# Patient Record
Sex: Female | Born: 1985
Health system: Southern US, Community
[De-identification: ages and names within clinical notes are randomized; demographics above are authoritative.]

## PROBLEM LIST (undated history)

## (undated) ENCOUNTER — Emergency Department (HOSPITAL_BASED_OUTPATIENT_CLINIC_OR_DEPARTMENT_OTHER): Payer: Medicaid Other

## (undated) DIAGNOSIS — L03114 Cellulitis of left upper limb: Secondary | ICD-10-CM

## (undated) DIAGNOSIS — F1910 Other psychoactive substance abuse, uncomplicated: Secondary | ICD-10-CM

## (undated) DIAGNOSIS — L409 Psoriasis, unspecified: Secondary | ICD-10-CM

## (undated) DIAGNOSIS — F32A Depression, unspecified: Secondary | ICD-10-CM

## (undated) DIAGNOSIS — F329 Major depressive disorder, single episode, unspecified: Secondary | ICD-10-CM

## (undated) DIAGNOSIS — F1110 Opioid abuse, uncomplicated: Secondary | ICD-10-CM

## (undated) DIAGNOSIS — F1120 Opioid dependence, uncomplicated: Secondary | ICD-10-CM

## (undated) DIAGNOSIS — F1990 Other psychoactive substance use, unspecified, uncomplicated: Secondary | ICD-10-CM

## (undated) DIAGNOSIS — F988 Other specified behavioral and emotional disorders with onset usually occurring in childhood and adolescence: Secondary | ICD-10-CM

## (undated) DIAGNOSIS — L02511 Cutaneous abscess of right hand: Secondary | ICD-10-CM

## (undated) DIAGNOSIS — F419 Anxiety disorder, unspecified: Secondary | ICD-10-CM

## (undated) DIAGNOSIS — J852 Abscess of lung without pneumonia: Secondary | ICD-10-CM

## (undated) HISTORY — DX: Psoriasis, unspecified: L40.9

## (undated) HISTORY — DX: Cutaneous abscess of right hand: L02.511

## (undated) HISTORY — DX: Opioid dependence, uncomplicated: F11.20

## (undated) HISTORY — DX: Other psychoactive substance abuse, uncomplicated: F19.10

## (undated) HISTORY — DX: Other psychoactive substance use, unspecified, uncomplicated: F19.90

## (undated) HISTORY — DX: Abscess of lung without pneumonia: J85.2

## (undated) HISTORY — DX: Major depressive disorder, single episode, unspecified: F32.9

## (undated) HISTORY — DX: Anxiety disorder, unspecified: F41.9

## (undated) HISTORY — DX: Depression, unspecified: F32.A

## (undated) HISTORY — DX: Cellulitis of left upper limb: L03.114

---

## 2017-10-16 ENCOUNTER — Emergency Department (HOSPITAL_COMMUNITY)
Admission: EM | Admit: 2017-10-16 | Discharge: 2017-10-16 | Discharge status: Against Medical Advice | Payer: Self-pay | Attending: Emergency Medicine | Admitting: Emergency Medicine

## 2017-10-16 ENCOUNTER — Encounter (HOSPITAL_COMMUNITY): Payer: Self-pay | Admitting: Neurology

## 2017-10-16 DIAGNOSIS — F1920 Other psychoactive substance dependence, uncomplicated: Secondary | ICD-10-CM | POA: Insufficient documentation

## 2017-10-16 DIAGNOSIS — L02414 Cutaneous abscess of left upper limb: Secondary | ICD-10-CM | POA: Insufficient documentation

## 2017-10-16 DIAGNOSIS — L0291 Cutaneous abscess, unspecified: Secondary | ICD-10-CM

## 2017-10-16 DIAGNOSIS — Z5320 Procedure and treatment not carried out because of patient's decision for unspecified reasons: Secondary | ICD-10-CM | POA: Insufficient documentation

## 2017-10-16 LAB — CBC WITH DIFFERENTIAL/PLATELET
ABS IMMATURE GRANULOCYTES: 0.03 10*3/uL (ref 0.00–0.07)
BASOS ABS: 0.1 10*3/uL (ref 0.0–0.1)
Basophils Relative: 1 %
EOS PCT: 2 %
Eosinophils Absolute: 0.2 10*3/uL (ref 0.0–0.5)
HCT: 43.1 % (ref 36.0–46.0)
HEMOGLOBIN: 13.1 g/dL (ref 12.0–15.0)
IMMATURE GRANULOCYTES: 0 %
LYMPHS PCT: 21 %
Lymphs Abs: 2 10*3/uL (ref 0.7–4.0)
MCH: 26.7 pg (ref 26.0–34.0)
MCHC: 30.4 g/dL (ref 30.0–36.0)
MCV: 87.8 fL (ref 80.0–100.0)
Monocytes Absolute: 1 10*3/uL (ref 0.1–1.0)
Monocytes Relative: 11 %
NEUTROS ABS: 6.6 10*3/uL (ref 1.7–7.7)
NEUTROS PCT: 65 %
NRBC: 0 % (ref 0.0–0.2)
Platelets: 416 10*3/uL — ABNORMAL HIGH (ref 150–400)
RBC: 4.91 MIL/uL (ref 3.87–5.11)
RDW: 15.2 % (ref 11.5–15.5)
WBC: 9.9 10*3/uL (ref 4.0–10.5)

## 2017-10-16 LAB — BASIC METABOLIC PANEL
ANION GAP: 10 (ref 5–15)
BUN: 10 mg/dL (ref 6–20)
CHLORIDE: 104 mmol/L (ref 98–111)
CO2: 24 mmol/L (ref 22–32)
Calcium: 9.2 mg/dL (ref 8.9–10.3)
Creatinine, Ser: 0.73 mg/dL (ref 0.44–1.00)
GFR calc non Af Amer: >60 mL/min (ref >60)
Glucose, Bld: 96 mg/dL (ref 70–99)
Potassium: 3.6 mmol/L (ref 3.5–5.1)
SODIUM: 138 mmol/L (ref 135–145)

## 2017-10-16 LAB — I-STAT BETA HCG BLOOD, ED (MC, WL, AP ONLY): I-stat hCG, quantitative: <5 m[IU]/mL (ref <5)

## 2017-10-16 NOTE — ED Notes (Signed)
Called pt for vitals no answer 

## 2017-10-16 NOTE — ED Triage Notes (Signed)
Pt reports abscess to left forearm x 1 week from drug use. Denies any fevers.

## 2017-10-16 NOTE — ED Provider Notes (Signed)
Patient placed in Quick Look pathway, seen and evaluated   Chief Complaint: abscess  HPI:   Pt complaining of abscess to left forearm x1 week. Endorses IVDU, states she does not know the drug.  Denies fever, drainage. Put baby powder on it. Pain 5/10 severity.  ROS: + abscess, - fever  Physical Exam:   Gen: No distress  Neuro: Awake and Alert  Skin: Warm    Focused Exam: large erythematous fluctuant abscess to left proximal forearm on dorsal aspect. Some surrounding induration.  Not actively draining. No streaking.     Initiation of care has begun. The patient has been counseled on the process, plan, and necessity for staying for the completion/evaluation, and the remainder of the medical screening examination    Cheryl Hogan, Cheryl N, PA-C 10/16/17 1317    Cheryl Laine, MD 10/16/17 [phone]

## 2017-10-16 NOTE — ED Notes (Signed)
Unable to update vitals pt did not answer

## 2017-10-16 NOTE — ED Notes (Signed)
Unable to update vitals pt not answering 

## 2017-10-16 NOTE — ED Notes (Signed)
No answer x3

## 2018-01-23 ENCOUNTER — Inpatient Hospital Stay (HOSPITAL_COMMUNITY)
Admission: EM | Admit: 2018-01-23 | Discharge: 2018-01-31 | DRG: 853 | Disposition: A | Discharge status: Home / Self Care | Payer: Self-pay | Attending: Internal Medicine | Admitting: Internal Medicine

## 2018-01-23 ENCOUNTER — Encounter (HOSPITAL_COMMUNITY): Payer: Self-pay | Admitting: Emergency Medicine

## 2018-01-23 ENCOUNTER — Emergency Department (HOSPITAL_COMMUNITY): Payer: Self-pay

## 2018-01-23 DIAGNOSIS — F1110 Opioid abuse, uncomplicated: Secondary | ICD-10-CM

## 2018-01-23 DIAGNOSIS — J189 Pneumonia, unspecified organism: Secondary | ICD-10-CM

## 2018-01-23 DIAGNOSIS — A419 Sepsis, unspecified organism: Principal | ICD-10-CM | POA: Diagnosis present

## 2018-01-23 DIAGNOSIS — F1123 Opioid dependence with withdrawal: Secondary | ICD-10-CM | POA: Diagnosis not present

## 2018-01-23 DIAGNOSIS — J852 Abscess of lung without pneumonia: Secondary | ICD-10-CM | POA: Diagnosis present

## 2018-01-23 DIAGNOSIS — L02512 Cutaneous abscess of left hand: Secondary | ICD-10-CM | POA: Diagnosis present

## 2018-01-23 DIAGNOSIS — Z590 Homelessness: Secondary | ICD-10-CM

## 2018-01-23 DIAGNOSIS — F1990 Other psychoactive substance use, unspecified, uncomplicated: Secondary | ICD-10-CM

## 2018-01-23 DIAGNOSIS — F17210 Nicotine dependence, cigarettes, uncomplicated: Secondary | ICD-10-CM | POA: Diagnosis present

## 2018-01-23 DIAGNOSIS — R0989 Other specified symptoms and signs involving the circulatory and respiratory systems: Secondary | ICD-10-CM

## 2018-01-23 DIAGNOSIS — M659 Synovitis and tenosynovitis, unspecified: Secondary | ICD-10-CM | POA: Diagnosis present

## 2018-01-23 DIAGNOSIS — B373 Candidiasis of vulva and vagina: Secondary | ICD-10-CM | POA: Diagnosis not present

## 2018-01-23 DIAGNOSIS — L02511 Cutaneous abscess of right hand: Secondary | ICD-10-CM | POA: Diagnosis present

## 2018-01-23 DIAGNOSIS — L03114 Cellulitis of left upper limb: Secondary | ICD-10-CM | POA: Diagnosis present

## 2018-01-23 DIAGNOSIS — I76 Septic arterial embolism: Secondary | ICD-10-CM

## 2018-01-23 DIAGNOSIS — I2690 Septic pulmonary embolism without acute cor pulmonale: Secondary | ICD-10-CM | POA: Diagnosis present

## 2018-01-23 HISTORY — DX: Other psychoactive substance use, unspecified, uncomplicated: F19.90

## 2018-01-23 HISTORY — DX: Other specified behavioral and emotional disorders with onset usually occurring in childhood and adolescence: F98.8

## 2018-01-23 HISTORY — DX: Opioid abuse, uncomplicated: F11.10

## 2018-01-23 LAB — BASIC METABOLIC PANEL
Anion gap: 8 (ref 5–15)
BUN: 8 mg/dL (ref 6–20)
CO2: 27 mmol/L (ref 22–32)
CREATININE: 0.5 mg/dL (ref 0.44–1.00)
Calcium: 8.4 mg/dL — ABNORMAL LOW (ref 8.9–10.3)
Chloride: 99 mmol/L (ref 98–111)
GFR calc Af Amer: >60 mL/min (ref >60)
Glucose, Bld: 126 mg/dL — ABNORMAL HIGH (ref 70–99)
POTASSIUM: 3 mmol/L — AB (ref 3.5–5.1)
Sodium: 134 mmol/L — ABNORMAL LOW (ref 135–145)

## 2018-01-23 LAB — CBC
HCT: 31 % — ABNORMAL LOW (ref 36.0–46.0)
Hemoglobin: 9.9 g/dL — ABNORMAL LOW (ref 12.0–15.0)
MCH: 26.8 pg (ref 26.0–34.0)
MCHC: 31.9 g/dL (ref 30.0–36.0)
MCV: 84 fL (ref 80.0–100.0)
PLATELETS: 666 10*3/uL — AB (ref 150–400)
RBC: 3.69 MIL/uL — ABNORMAL LOW (ref 3.87–5.11)
RDW: 15.5 % (ref 11.5–15.5)
WBC: 20.6 10*3/uL — AB (ref 4.0–10.5)
nRBC: 0 % (ref 0.0–0.2)

## 2018-01-23 LAB — I-STAT BETA HCG BLOOD, ED (MC, WL, AP ONLY): I-stat hCG, quantitative: 9.4 m[IU]/mL — ABNORMAL HIGH (ref <5)

## 2018-01-23 LAB — I-STAT TROPONIN, ED: TROPONIN I, POC: 0 ng/mL (ref 0.00–0.08)

## 2018-01-23 MED ORDER — SODIUM CHLORIDE 0.9% FLUSH
3.0000 mL | Freq: Once | INTRAVENOUS | Status: AC
  Administered 2018-01-24: 3 mL via INTRAVENOUS

## 2018-01-23 NOTE — ED Triage Notes (Addendum)
Pt reports she is having chest pain X2 weeks, she needs detox from drugs, swollen hand which appears to be infected (covered w/ track marks.)  Reports last used w/ two hours ago, pressured speech, lightheaded.

## 2018-01-23 NOTE — ED Notes (Addendum)
PT called for room with no response 1X

## 2018-01-24 ENCOUNTER — Encounter (HOSPITAL_COMMUNITY): Payer: Self-pay | Admitting: Internal Medicine

## 2018-01-24 ENCOUNTER — Inpatient Hospital Stay (HOSPITAL_COMMUNITY): Payer: Self-pay | Admitting: Certified Registered Nurse Anesthetist

## 2018-01-24 ENCOUNTER — Other Ambulatory Visit: Payer: Self-pay

## 2018-01-24 ENCOUNTER — Encounter (HOSPITAL_COMMUNITY)
Admission: EM | Disposition: A | Discharge status: Home / Self Care | Payer: Self-pay | Source: Home / Self Care | Attending: Internal Medicine

## 2018-01-24 ENCOUNTER — Inpatient Hospital Stay (HOSPITAL_COMMUNITY): Payer: Self-pay

## 2018-01-24 DIAGNOSIS — J851 Abscess of lung with pneumonia: Secondary | ICD-10-CM

## 2018-01-24 DIAGNOSIS — L02511 Cutaneous abscess of right hand: Secondary | ICD-10-CM

## 2018-01-24 DIAGNOSIS — A419 Sepsis, unspecified organism: Secondary | ICD-10-CM | POA: Diagnosis present

## 2018-01-24 DIAGNOSIS — J852 Abscess of lung without pneumonia: Secondary | ICD-10-CM

## 2018-01-24 DIAGNOSIS — L02519 Cutaneous abscess of unspecified hand: Secondary | ICD-10-CM

## 2018-01-24 DIAGNOSIS — F1110 Opioid abuse, uncomplicated: Secondary | ICD-10-CM

## 2018-01-24 HISTORY — DX: Cutaneous abscess of right hand: L02.511

## 2018-01-24 HISTORY — PX: I & D EXTREMITY: SHX5045

## 2018-01-24 HISTORY — DX: Abscess of lung without pneumonia: J85.2

## 2018-01-24 LAB — HIV ANTIBODY (ROUTINE TESTING W REFLEX): HIV Screen 4th Generation wRfx: NONREACTIVE

## 2018-01-24 LAB — URINALYSIS, ROUTINE W REFLEX MICROSCOPIC
Bilirubin Urine: NEGATIVE
Glucose, UA: NEGATIVE mg/dL
Ketones, ur: NEGATIVE mg/dL
Leukocytes, UA: NEGATIVE
Nitrite: NEGATIVE
Protein, ur: NEGATIVE mg/dL
Specific Gravity, Urine: 1.004 — ABNORMAL LOW (ref 1.005–1.030)
pH: 7 (ref 5.0–8.0)

## 2018-01-24 LAB — RAPID URINE DRUG SCREEN, HOSP PERFORMED
Amphetamines: POSITIVE — AB
Barbiturates: NOT DETECTED
Benzodiazepines: NOT DETECTED
Cocaine: POSITIVE — AB
Opiates: NOT DETECTED
Tetrahydrocannabinol: POSITIVE — AB

## 2018-01-24 LAB — POCT PREGNANCY, URINE: Preg Test, Ur: NEGATIVE

## 2018-01-24 LAB — LACTIC ACID, PLASMA: Lactic Acid, Venous: 0.9 mmol/L (ref 0.5–1.9)

## 2018-01-24 LAB — HCG, QUANTITATIVE, PREGNANCY: hCG, Beta Chain, Quant, S: <1 m[IU]/mL (ref <5)

## 2018-01-24 LAB — SURGICAL PCR SCREEN
MRSA, PCR: POSITIVE — AB
Staphylococcus aureus: POSITIVE — AB

## 2018-01-24 LAB — INFLUENZA PANEL BY PCR (TYPE A & B)
INFLAPCR: NEGATIVE
Influenza B By PCR: NEGATIVE

## 2018-01-24 LAB — PROCALCITONIN: PROCALCITONIN: 1.07 ng/mL

## 2018-01-24 SURGERY — IRRIGATION AND DEBRIDEMENT EXTREMITY
Anesthesia: General | Site: Hand | Laterality: Bilateral

## 2018-01-24 MED ORDER — LIDOCAINE HCL (CARDIAC) PF 100 MG/5ML IV SOSY
PREFILLED_SYRINGE | INTRAVENOUS | Status: DC | PRN
  Administered 2018-01-24: 100 mg via INTRAVENOUS

## 2018-01-24 MED ORDER — SODIUM CHLORIDE 0.9 % IV SOLN
1.0000 g | Freq: Three times a day (TID) | INTRAVENOUS | Status: DC
  Administered 2018-01-24 – 2018-01-26 (×6): 1 g via INTRAVENOUS
  Filled 2018-01-24 (×7): qty 1

## 2018-01-24 MED ORDER — OXYCODONE HCL 5 MG/5ML PO SOLN: 5.0000 mg | Freq: Once | ORAL | Status: DC | PRN

## 2018-01-24 MED ORDER — BUPRENORPHINE HCL-NALOXONE HCL 8-2 MG SL SUBL: 1.0000 | SUBLINGUAL_TABLET | Freq: Two times a day (BID) | SUBLINGUAL | Status: DC

## 2018-01-24 MED ORDER — ONDANSETRON HCL 4 MG/2ML IJ SOLN
INTRAMUSCULAR | Status: AC
  Filled 2018-01-24: qty 2

## 2018-01-24 MED ORDER — ACETAMINOPHEN 650 MG RE SUPP: 650.0000 mg | Freq: Four times a day (QID) | RECTAL | Status: DC | PRN

## 2018-01-24 MED ORDER — LORAZEPAM 2 MG/ML IJ SOLN
1.0000 mg | Freq: Once | INTRAMUSCULAR | Status: AC
  Administered 2018-01-24: 1 mg via INTRAVENOUS

## 2018-01-24 MED ORDER — SODIUM CHLORIDE 0.9 % IR SOLN
Status: DC | PRN
  Administered 2018-01-24: 3000 mL

## 2018-01-24 MED ORDER — LORAZEPAM 2 MG/ML IJ SOLN
INTRAMUSCULAR | Status: AC
  Administered 2018-01-24: 1 mg via INTRAVENOUS
  Filled 2018-01-24: qty 1

## 2018-01-24 MED ORDER — FENTANYL CITRATE (PF) 100 MCG/2ML IJ SOLN
INTRAMUSCULAR | Status: AC
  Filled 2018-01-24: qty 2

## 2018-01-24 MED ORDER — BUPRENORPHINE HCL-NALOXONE HCL 2-0.5 MG SL SUBL: 2.0000 | SUBLINGUAL_TABLET | SUBLINGUAL | Status: AC | PRN

## 2018-01-24 MED ORDER — FENTANYL CITRATE (PF) 250 MCG/5ML IJ SOLN
INTRAMUSCULAR | Status: AC
  Filled 2018-01-24: qty 5

## 2018-01-24 MED ORDER — SODIUM CHLORIDE 0.9 % IV SOLN
2.0000 g | Freq: Once | INTRAVENOUS | Status: AC
  Administered 2018-01-24: 2 g via INTRAVENOUS
  Filled 2018-01-24: qty 2

## 2018-01-24 MED ORDER — KETAMINE HCL 50 MG/5ML IJ SOSY
PREFILLED_SYRINGE | INTRAMUSCULAR | Status: AC
  Filled 2018-01-24: qty 5

## 2018-01-24 MED ORDER — METRONIDAZOLE IN NACL 5-0.79 MG/ML-% IV SOLN
500.0000 mg | Freq: Three times a day (TID) | INTRAVENOUS | Status: DC
  Administered 2018-01-24 (×2): 500 mg via INTRAVENOUS
  Filled 2018-01-24 (×3): qty 100

## 2018-01-24 MED ORDER — VANCOMYCIN HCL IN DEXTROSE 1-5 GM/200ML-% IV SOLN
1000.0000 mg | Freq: Once | INTRAVENOUS | Status: AC
  Administered 2018-01-24: 1000 mg via INTRAVENOUS
  Filled 2018-01-24: qty 200

## 2018-01-24 MED ORDER — ACETAMINOPHEN 325 MG PO TABS
650.0000 mg | ORAL_TABLET | Freq: Four times a day (QID) | ORAL | Status: DC | PRN
  Administered 2018-01-24 – 2018-01-30 (×8): 650 mg via ORAL
  Filled 2018-01-24 (×8): qty 2

## 2018-01-24 MED ORDER — PROPOFOL 10 MG/ML IV BOLUS
INTRAVENOUS | Status: AC
  Filled 2018-01-24: qty 20

## 2018-01-24 MED ORDER — BUPRENORPHINE HCL-NALOXONE HCL 8-2 MG SL SUBL
1.0000 | SUBLINGUAL_TABLET | Freq: Two times a day (BID) | SUBLINGUAL | Status: DC
  Administered 2018-01-24 – 2018-01-31 (×14): 1 via SUBLINGUAL
  Filled 2018-01-24 (×14): qty 1

## 2018-01-24 MED ORDER — ONDANSETRON HCL 4 MG/2ML IJ SOLN
INTRAMUSCULAR | Status: DC | PRN
  Administered 2018-01-24: 4 mg via INTRAVENOUS

## 2018-01-24 MED ORDER — OXYCODONE HCL 5 MG PO TABS: 5.0000 mg | ORAL_TABLET | Freq: Once | ORAL | Status: DC | PRN

## 2018-01-24 MED ORDER — SODIUM CHLORIDE 0.9 % IV SOLN
INTRAVENOUS | Status: DC
  Administered 2018-01-24 – 2018-01-28 (×5): via INTRAVENOUS

## 2018-01-24 MED ORDER — VANCOMYCIN HCL IN DEXTROSE 1-5 GM/200ML-% IV SOLN
1000.0000 mg | Freq: Three times a day (TID) | INTRAVENOUS | Status: DC
  Administered 2018-01-24 (×3): 1000 mg via INTRAVENOUS
  Filled 2018-01-24 (×6): qty 200

## 2018-01-24 MED ORDER — SUCCINYLCHOLINE 20MG/ML (10ML) SYRINGE FOR MEDFUSION PUMP - OPTIME
INTRAMUSCULAR | Status: DC | PRN
  Administered 2018-01-24: 120 mg via INTRAVENOUS

## 2018-01-24 MED ORDER — LACTATED RINGERS IV SOLN
INTRAVENOUS | Status: DC | PRN
  Administered 2018-01-24 (×2): via INTRAVENOUS

## 2018-01-24 MED ORDER — PROMETHAZINE HCL 25 MG/ML IJ SOLN: 6.2500 mg | INTRAMUSCULAR | Status: DC | PRN

## 2018-01-24 MED ORDER — MIDAZOLAM HCL 2 MG/2ML IJ SOLN
INTRAMUSCULAR | Status: AC
  Filled 2018-01-24: qty 2

## 2018-01-24 MED ORDER — NICOTINE 21 MG/24HR TD PT24
21.0000 mg | MEDICATED_PATCH | Freq: Every day | TRANSDERMAL | Status: DC
  Administered 2018-01-25 – 2018-01-31 (×7): 21 mg via TRANSDERMAL
  Filled 2018-01-24 (×7): qty 1

## 2018-01-24 MED ORDER — MUPIROCIN 2 % EX OINT
1.0000 "application " | TOPICAL_OINTMENT | Freq: Two times a day (BID) | CUTANEOUS | Status: AC
  Administered 2018-01-24 – 2018-01-29 (×11): 1 via NASAL
  Filled 2018-01-24: qty 22

## 2018-01-24 MED ORDER — ONDANSETRON HCL 4 MG PO TABS: 4.0000 mg | ORAL_TABLET | Freq: Four times a day (QID) | ORAL | Status: DC | PRN

## 2018-01-24 MED ORDER — FENTANYL CITRATE (PF) 100 MCG/2ML IJ SOLN
INTRAMUSCULAR | Status: DC | PRN
  Administered 2018-01-24: 150 ug via INTRAVENOUS
  Administered 2018-01-24: 100 ug via INTRAVENOUS

## 2018-01-24 MED ORDER — POTASSIUM CHLORIDE CRYS ER 20 MEQ PO TBCR
40.0000 meq | EXTENDED_RELEASE_TABLET | Freq: Once | ORAL | Status: AC
  Administered 2018-01-25: 40 meq via ORAL
  Filled 2018-01-24: qty 2

## 2018-01-24 MED ORDER — METRONIDAZOLE IN NACL 5-0.79 MG/ML-% IV SOLN
500.0000 mg | Freq: Three times a day (TID) | INTRAVENOUS | Status: DC
  Administered 2018-01-25 (×2): 500 mg via INTRAVENOUS
  Filled 2018-01-24 (×4): qty 100

## 2018-01-24 MED ORDER — KETAMINE HCL 10 MG/ML IJ SOLN
INTRAMUSCULAR | Status: DC | PRN
  Administered 2018-01-24: 10 mg via INTRAVENOUS
  Administered 2018-01-24: 20 mg via INTRAVENOUS

## 2018-01-24 MED ORDER — CEFAZOLIN SODIUM-DEXTROSE 2-4 GM/100ML-% IV SOLN
2.0000 g | INTRAVENOUS | Status: AC
  Administered 2018-01-24: 2 g via INTRAVENOUS
  Filled 2018-01-24 (×2): qty 100

## 2018-01-24 MED ORDER — PROPOFOL 10 MG/ML IV BOLUS
INTRAVENOUS | Status: DC | PRN
  Administered 2018-01-24: 50 mg via INTRAVENOUS
  Administered 2018-01-24: 200 mg via INTRAVENOUS
  Administered 2018-01-24: 100 mg via INTRAVENOUS

## 2018-01-24 MED ORDER — PHENYLEPHRINE 40 MCG/ML (10ML) SYRINGE FOR IV PUSH (FOR BLOOD PRESSURE SUPPORT)
PREFILLED_SYRINGE | INTRAVENOUS | Status: AC
  Filled 2018-01-24: qty 10

## 2018-01-24 MED ORDER — FENTANYL CITRATE (PF) 100 MCG/2ML IJ SOLN
25.0000 ug | INTRAMUSCULAR | Status: DC | PRN
  Administered 2018-01-24 (×2): 50 ug via INTRAVENOUS

## 2018-01-24 MED ORDER — POVIDONE-IODINE 10 % EX SWAB: 2.0000 "application " | Freq: Once | CUTANEOUS | Status: DC

## 2018-01-24 MED ORDER — BUPRENORPHINE HCL-NALOXONE HCL 2-0.5 MG SL SUBL
2.0000 | SUBLINGUAL_TABLET | Freq: Once | SUBLINGUAL | Status: AC
  Administered 2018-01-24: 2 via SUBLINGUAL
  Filled 2018-01-24: qty 2

## 2018-01-24 MED ORDER — MIDAZOLAM HCL 2 MG/2ML IJ SOLN
INTRAMUSCULAR | Status: DC | PRN
  Administered 2018-01-24: 2 mg via INTRAVENOUS

## 2018-01-24 MED ORDER — CHLORHEXIDINE GLUCONATE CLOTH 2 % EX PADS
6.0000 | MEDICATED_PAD | Freq: Every day | CUTANEOUS | Status: AC
  Administered 2018-01-25 – 2018-01-29 (×5): 6 via TOPICAL

## 2018-01-24 MED ORDER — ONDANSETRON HCL 4 MG/2ML IJ SOLN: 4.0000 mg | Freq: Four times a day (QID) | INTRAMUSCULAR | Status: DC | PRN

## 2018-01-24 MED ORDER — CHLORHEXIDINE GLUCONATE 4 % EX LIQD
60.0000 mL | Freq: Once | CUTANEOUS | Status: AC
  Administered 2018-01-24: 4 via TOPICAL
  Filled 2018-01-24: qty 60

## 2018-01-24 SURGICAL SUPPLY — 65 items
BANDAGE ACE 3X5.8 VEL STRL LF (GAUZE/BANDAGES/DRESSINGS) ×3 IMPLANT
BANDAGE ACE 4X5 VEL STRL LF (GAUZE/BANDAGES/DRESSINGS) ×3 IMPLANT
BANDAGE ELASTIC 4 VELCRO ST LF (GAUZE/BANDAGES/DRESSINGS) ×6 IMPLANT
BNDG COHESIVE 1X5 TAN STRL LF (GAUZE/BANDAGES/DRESSINGS) IMPLANT
BNDG CONFORM 2 STRL LF (GAUZE/BANDAGES/DRESSINGS) IMPLANT
BNDG ESMARK 4X9 LF (GAUZE/BANDAGES/DRESSINGS) ×3 IMPLANT
BNDG GAUZE ELAST 4 BULKY (GAUZE/BANDAGES/DRESSINGS) ×6 IMPLANT
CORDS BIPOLAR (ELECTRODE) IMPLANT
COVER SURGICAL LIGHT HANDLE (MISCELLANEOUS) ×3 IMPLANT
COVER WAND RF STERILE (DRAPES) ×3 IMPLANT
CUFF TOURNIQUET SINGLE 18IN (TOURNIQUET CUFF) ×3 IMPLANT
CUFF TOURNIQUET SINGLE 24IN (TOURNIQUET CUFF) IMPLANT
DRAIN PENROSE 1/4X12 LTX STRL (WOUND CARE) IMPLANT
DRAPE ORTHO SPLIT 77X108 STRL (DRAPES) ×4
DRAPE SURG 17X23 STRL (DRAPES) ×3 IMPLANT
DRAPE SURG ORHT 6 SPLT 77X108 (DRAPES) ×2 IMPLANT
DRSG ADAPTIC 3X8 NADH LF (GAUZE/BANDAGES/DRESSINGS) ×3 IMPLANT
DRSG EMULSION OIL 3X3 NADH (GAUZE/BANDAGES/DRESSINGS) ×3 IMPLANT
ELECT REM PT RETURN 9FT ADLT (ELECTROSURGICAL)
ELECTRODE REM PT RTRN 9FT ADLT (ELECTROSURGICAL) IMPLANT
GAUZE SPONGE 4X4 12PLY STRL (GAUZE/BANDAGES/DRESSINGS) ×3 IMPLANT
GAUZE SPONGE 4X4 12PLY STRL LF (GAUZE/BANDAGES/DRESSINGS) ×3 IMPLANT
GAUZE XEROFORM 1X8 LF (GAUZE/BANDAGES/DRESSINGS) ×3 IMPLANT
GAUZE XEROFORM 5X9 LF (GAUZE/BANDAGES/DRESSINGS) IMPLANT
GLOVE BIO SURGEON STRL SZ7 (GLOVE) ×6 IMPLANT
GLOVE BIOGEL PI IND STRL 7.0 (GLOVE) ×1 IMPLANT
GLOVE BIOGEL PI IND STRL 8.5 (GLOVE) ×1 IMPLANT
GLOVE BIOGEL PI INDICATOR 7.0 (GLOVE) ×2
GLOVE BIOGEL PI INDICATOR 8.5 (GLOVE) ×2
GLOVE SURG ORTHO 8.0 STRL STRW (GLOVE) ×6 IMPLANT
GOWN STRL REUS W/ TWL LRG LVL3 (GOWN DISPOSABLE) ×1 IMPLANT
GOWN STRL REUS W/ TWL XL LVL3 (GOWN DISPOSABLE) ×1 IMPLANT
GOWN STRL REUS W/TWL LRG LVL3 (GOWN DISPOSABLE) ×2
GOWN STRL REUS W/TWL XL LVL3 (GOWN DISPOSABLE) ×2
HANDPIECE INTERPULSE COAX TIP (DISPOSABLE)
KIT BASIN OR (CUSTOM PROCEDURE TRAY) ×3 IMPLANT
KIT TURNOVER KIT B (KITS) ×3 IMPLANT
MANIFOLD NEPTUNE II (INSTRUMENTS) ×3 IMPLANT
NEEDLE HYPO 25GX1X1/2 BEV (NEEDLE) IMPLANT
NS IRRIG 1000ML POUR BTL (IV SOLUTION) ×3 IMPLANT
PACK ORTHO EXTREMITY (CUSTOM PROCEDURE TRAY) ×3 IMPLANT
PAD ARMBOARD 7.5X6 YLW CONV (MISCELLANEOUS) ×6 IMPLANT
PAD CAST 4YDX4 CTTN HI CHSV (CAST SUPPLIES) ×1 IMPLANT
PADDING CAST COTTON 4X4 STRL (CAST SUPPLIES) ×2
SET CYSTO W/LG BORE CLAMP LF (SET/KITS/TRAYS/PACK) IMPLANT
SET HNDPC FAN SPRY TIP SCT (DISPOSABLE) IMPLANT
SOAP 2 % CHG 4 OZ (WOUND CARE) ×3 IMPLANT
SPLINT FIBERGLASS 3X35 (CAST SUPPLIES) ×3 IMPLANT
SPONGE LAP 18X18 X RAY DECT (DISPOSABLE) ×3 IMPLANT
SPONGE LAP 4X18 RFD (DISPOSABLE) ×3 IMPLANT
STOCKINETTE 3IN STRL (GAUZE/BANDAGES/DRESSINGS) ×3 IMPLANT
SUT ETHILON 4 0 PS 2 18 (SUTURE) IMPLANT
SUT ETHILON 5 0 P 3 18 (SUTURE)
SUT NYLON ETHILON 5-0 P-3 1X18 (SUTURE) IMPLANT
SUT PROLENE 3 0 PS 2 (SUTURE) ×6 IMPLANT
SWAB COLLECTION DEVICE MRSA (MISCELLANEOUS) ×3 IMPLANT
SWAB CULTURE ESWAB REG 1ML (MISCELLANEOUS) IMPLANT
SYR CONTROL 10ML LL (SYRINGE) IMPLANT
TOWEL OR 17X24 6PK STRL BLUE (TOWEL DISPOSABLE) ×3 IMPLANT
TOWEL OR 17X26 10 PK STRL BLUE (TOWEL DISPOSABLE) ×3 IMPLANT
TUBE CONNECTING 12'X1/4 (SUCTIONS) ×1
TUBE CONNECTING 12X1/4 (SUCTIONS) ×2 IMPLANT
UNDERPAD 30X30 (UNDERPADS AND DIAPERS) ×3 IMPLANT
WATER STERILE IRR 1000ML POUR (IV SOLUTION) ×3 IMPLANT
YANKAUER SUCT BULB TIP NO VENT (SUCTIONS) ×3 IMPLANT

## 2018-01-24 NOTE — Progress Notes (Signed)
Pharmacy Antibiotic Note  Cheryl Hogan is a 33 y.o. female admitted on 01/23/2018 with sepsis/septic emboli .  Pharmacy has been consulted for Vancomycin and Cefepime dosing.  Vancomycin 1 g IV given in ED at  0300  Plan: Vancomycin 1000 mg IV q8h Cefepime 1 g IV q8h  Height: 5\' 9"  (175.3 cm) Weight: 165 lb (74.8 kg) IBW/kg (Calculated) : 66.2  Temp (24hrs), Avg:98.6 F (37 C), Min:98.6 F (37 C), Max:98.6 F (37 C)  Recent Labs  Lab 01/23/18 2041 01/24/18 0042  WBC 20.6*  --   CREATININE 0.50  --   LATICACIDVEN  --  0.9    Estimated Creatinine Clearance: 107.5 mL/min (by C-G formula based on SCr of 0.5 mg/dL).    No Known Allergies   Eddie Candle 01/24/2018 5:31 AM

## 2018-01-24 NOTE — Anesthesia Postprocedure Evaluation (Signed)
Anesthesia Post Note  Patient: Cheryl Hogan  Procedure(s) Performed: IRRIGATION AND DEBRIDEMENT OF HAND (Bilateral Hand)     Patient location during evaluation: PACU Anesthesia Type: General Level of consciousness: awake and alert Pain management: pain level controlled Vital Signs Assessment: post-procedure vital signs reviewed and stable Respiratory status: spontaneous breathing, nonlabored ventilation, respiratory function stable and patient connected to nasal cannula oxygen Cardiovascular status: blood pressure returned to baseline and stable Postop Assessment: no apparent nausea or vomiting Anesthetic complications: no    Last Vitals:  Vitals:   01/24/18 2009 01/24/18 2020  BP: (!) 142/73 (!) 152/72  Pulse: 91 84  Resp: (!) 31 (!) 28  Temp:  36.9 C  SpO2: 100% 100%    Last Pain:  Vitals:   01/24/18 2020  TempSrc: Oral  PainSc: 10-Worst pain ever                 Emery Dupuy COKER

## 2018-01-24 NOTE — Transfer of Care (Signed)
Immediate Anesthesia Transfer of Care Note  Patient: Cheryl Hogan  Procedure(s) Performed: IRRIGATION AND DEBRIDEMENT OF HAND (Bilateral Hand)  Patient Location: PACU  Anesthesia Type:General  Level of Consciousness: sedated, drowsy, patient cooperative and responds to stimulation  Airway & Oxygen Therapy: Patient Spontanous Breathing and Patient connected to nasal cannula oxygen  Post-op Assessment: Report given to RN, Post -op Vital signs reviewed and stable and Patient moving all extremities X 4  Post vital signs: Reviewed and stable  Last Vitals:  Vitals Value Taken Time  BP 134/67 01/24/2018  7:38 PM  Temp    Pulse 94 01/24/2018  7:41 PM  Resp 42 01/24/2018  7:41 PM  SpO2 100 % 01/24/2018  7:41 PM  Vitals shown include unvalidated device data.  Last Pain:  Vitals:   01/24/18 1645  TempSrc: Oral  PainSc:       Patients Stated Pain Goal: 0 (01/24/18 1300)  Complications: No apparent anesthesia complications

## 2018-01-24 NOTE — Progress Notes (Signed)
Patient seen and examined, admitted earlier this morning by Dr. Julian Reil -This is a 33 year old female with history of active IV heroin abuse presented to the ED with swollen right hand with a dorsal abscess and pain and swelling of her left hand as well. -Continue IV vancomycin, follow-up blood cultures -Orthopedics/hand surgery consulted for I&D -Follow-up CT chest, concern for possible septic emboli/endocarditis based on chest x-ray -Check 2D echocardiogram -Started on Suboxone protocol for withdrawal  Cheryl Cove, MD

## 2018-01-24 NOTE — Consult Note (Addendum)
Reason for Consult:Hand infection Referring Physician: P Cheryl Hogan is an 33 y.o. female.  HPI: Cheryl Hogan came to the ED with cellulitis of the left hand. It started about a week ago and has steadily gotten worse. She has had other skin infections 2/2 her IVDU. She injects heroin.  Past Medical History:  Diagnosis Date  . ADD (attention deficit disorder)   . Heroin abuse (HCC)   . IVDU (intravenous drug user)     History reviewed. No pertinent surgical history.  History reviewed. No pertinent family history.  Social History:  reports that she has been smoking. She has never used smokeless tobacco. She reports previous alcohol use. She reports current drug use. Drugs: IV and Heroin.  Allergies: No Known Allergies  Medications: I have reviewed the patient's current medications.  Results for orders placed or performed during the hospital encounter of 01/23/18 (from the past 48 hour(s))  Basic metabolic panel     Status: Abnormal   Collection Time: 01/23/18  8:41 PM  Result Value Ref Range   Sodium 134 (L) 135 - 145 mmol/L   Potassium 3.0 (L) 3.5 - 5.1 mmol/L   Chloride 99 98 - 111 mmol/L   CO2 27 22 - 32 mmol/L   Glucose, Bld 126 (H) 70 - 99 mg/dL   BUN 8 6 - 20 mg/dL   Creatinine, Ser 0.03 0.44 - 1.00 mg/dL   Calcium 8.4 (L) 8.9 - 10.3 mg/dL   GFR calc non Af Amer >60 >60 mL/min   GFR calc Af Amer >60 >60 mL/min   Anion gap 8 5 - 15    Comment: Performed at [facility], [address] [place], [place] [address]  CBC     Status: Abnormal   Collection Time: 01/23/18  8:41 PM  Result Value Ref Range   WBC 20.6 (H) 4.0 - 10.5 K/uL   RBC 3.69 (L) 3.87 - 5.11 MIL/uL   Hemoglobin 9.9 (L) 12.0 - 15.0 g/dL   HCT 15.0 (L) 56.9 - 79.4 %   MCV 84.0 80.0 - 100.0 fL   MCH 26.8 26.0 - 34.0 pg   MCHC 31.9 30.0 - 36.0 g/dL   RDW 80.1 65.5 - 37.4 %   Platelets 666 (H) 150 - 400 K/uL   nRBC 0.0 0.0 - 0.2 %    Comment: Performed at [facility], [address] [place], [place] [address]  I-stat troponin, ED     Status: None   Collection Time: 01/23/18  9:02 PM  Result Value Ref Range   Troponin i, poc 0.00 0.00 - 0.08 ng/mL   Comment 3            Comment: Due to the release kinetics of cTnI, a negative result within the first hours of the onset of symptoms does not rule out myocardial infarction with certainty. If myocardial infarction is still suspected, repeat the test at appropriate intervals.   I-Stat beta hCG blood, ED     Status: Abnormal   Collection Time: 01/23/18  9:28 PM  Result Value Ref Range   I-stat hCG, quantitative 9.4 (H) <5 mIU/mL   Comment 3            Comment:   GEST. AGE      CONC.  (mIU/mL)   <=1 WEEK        5 - 50     2 WEEKS       50 - 500  3 WEEKS       100 - 10,000     4 WEEKS     1,000 - 30,000        FEMALE AND NON-PREGNANT FEMALE:     LESS THAN 5 mIU/mL   Urinalysis, Routine w reflex microscopic     Status: Abnormal   Collection Time: 01/23/18 11:35 PM  Result Value Ref Range   Color, Urine YELLOW YELLOW   APPearance CLEAR CLEAR   Specific Gravity, Urine 1.004 (L) 1.005 - 1.030   pH 7.0 5.0 - 8.0   Glucose, UA NEGATIVE NEGATIVE mg/dL   Hgb urine dipstick MODERATE (A) NEGATIVE   Bilirubin Urine NEGATIVE NEGATIVE   Ketones, ur NEGATIVE NEGATIVE mg/dL   Protein, ur NEGATIVE NEGATIVE mg/dL   Nitrite NEGATIVE NEGATIVE   Leukocytes, UA NEGATIVE NEGATIVE   RBC / HPF 0-5 0 - 5 RBC/hpf   WBC, UA 0-5 0 - 5 WBC/hpf   Bacteria, UA RARE (A) NONE SEEN   Squamous Epithelial / LPF 0-5 0 - 5    Comment: Performed at [facility], [address] [place], [place] [address]  Urine rapid drug screen (hosp performed)     Status: Abnormal   Collection Time: 01/23/18 11:35 PM  Result Value Ref Range   Opiates NONE DETECTED NONE DETECTED   Cocaine POSITIVE (A) NONE DETECTED   Benzodiazepines NONE DETECTED NONE DETECTED   Amphetamines POSITIVE (A) NONE DETECTED   Tetrahydrocannabinol POSITIVE (A) NONE DETECTED    Barbiturates NONE DETECTED NONE DETECTED    Comment: (NOTE) DRUG SCREEN FOR MEDICAL PURPOSES ONLY.  IF CONFIRMATION IS NEEDED FOR ANY PURPOSE, NOTIFY LAB WITHIN 5 DAYS. LOWEST DETECTABLE LIMITS FOR URINE DRUG SCREEN Drug Class                     Cutoff (ng/mL) Amphetamine and metabolites    1000 Barbiturate and metabolites    200 Benzodiazepine                 200 Tricyclics and metabolites     300 Opiates and metabolites        300 Cocaine and metabolites        300 THC                            50 Performed at [facility], [address] [place], [place] [address]   Lactic acid, plasma     Status: None   Collection Time: 01/24/18 12:42 AM  Result Value Ref Range   Lactic Acid, Venous 0.9 0.5 - 1.9 mmol/L    Comment: Performed at [facility], [address] [place], [place] [address]  Procalcitonin     Status: None   Collection Time: 01/24/18 12:42 AM  Result Value Ref Range   Procalcitonin 1.07 ng/mL    Comment:        Interpretation: PCT > 0.5 ng/mL and <= 2 ng/mL: Systemic infection (sepsis) is possible, but other conditions are known to elevate PCT as well. (NOTE)       Sepsis PCT Algorithm           Lower Respiratory Tract                                      Infection PCT Algorithm    ----------------------------     ----------------------------  PCT < 0.25 ng/mL                PCT < 0.10 ng/mL         Strongly encourage             Strongly discourage   discontinuation of antibiotics    initiation of antibiotics    ----------------------------     -----------------------------       PCT 0.25 - 0.50 ng/mL            PCT 0.10 - 0.25 ng/mL               OR       >80% decrease in PCT            Discourage initiation of                                            antibiotics      Encourage discontinuation           of antibiotics    ----------------------------     -----------------------------         PCT >= 0.50 ng/mL              PCT  0.26 - 0.50 ng/mL                AND       <80% decrease in PCT             Encourage initiation of                                             antibiotics       Encourage continuation           of antibiotics    ----------------------------     -----------------------------        PCT >= 0.50 ng/mL                  PCT > 0.50 ng/mL               AND         increase in PCT                  Strongly encourage                                      initiation of antibiotics    Strongly encourage escalation           of antibiotics                                     -----------------------------                                           PCT <= 0.25 ng/mL  OR                                        > 80% decrease in PCT                                     Discontinue / Do not initiate                                             antibiotics Performed at [facility], [address] [place], [place] [address]   Influenza panel by PCR (type A & B)     Status: None   Collection Time: 01/24/18 12:44 AM  Result Value Ref Range   Influenza A By PCR NEGATIVE NEGATIVE   Influenza B By PCR NEGATIVE NEGATIVE    Comment: (NOTE) The Xpert Xpress Flu assay is intended as an aid in the diagnosis of  influenza and should not be used as a sole basis for treatment.  This  assay is FDA approved for nasopharyngeal swab specimens only. Nasal  washings and aspirates are unacceptable for Xpert Xpress Flu testing. Performed at [facility], [address] [place], [place] [address]   hCG, quantitative, pregnancy     Status: None   Collection Time: 01/24/18 12:45 AM  Result Value Ref Range   hCG, Beta Chain, Quant, S <1 <5 mIU/mL    Comment:          GEST. AGE      CONC.  (mIU/mL)   <=1 WEEK        5 - 50     2 WEEKS       50 - 500     3 WEEKS       100 - 10,000     4 WEEKS     1,000 - 30,000     5 WEEKS     3,500 - 115,000   6-8 WEEKS      12,000 - 270,000    12 WEEKS     15,000 - 220,000        FEMALE AND NON-PREGNANT FEMALE:     LESS THAN 5 mIU/mL Performed at [facility], [address] [place], [place] [address]     Dg Chest 2 View  Result Date: 01/23/2018 CLINICAL DATA:  Chest pain x2 weeks. Patient states she needs detoxification from drugs. Swollen hand with track marks. EXAM: CHEST - 2 VIEW COMPARISON:  None. FINDINGS: Heart size and mediastinal contours are within normal limits. Pulmonary consolidations in the lingula and left upper lobe with air fluid level in the left upper lobe consolidation raises concern for multifocal pneumonia with cavitary component in the left upper lobe. Differential considerations would include pulmonary abscess, less likely septic emboli given the unilateral appearance of this process. No acute osseous abnormality. No effusion or pneumothorax. IMPRESSION: Pulmonary consolidations in the lingula and left upper lobe. Findings raise concern for multilobar pneumonia with cavitary component in the left upper lobe. Differential considerations would include pulmonary abscess, less likely septic emboli or cavitary mass. Electronically Signed   By: Tollie Ethavid  Kwon M.D.   On: 01/23/2018 21:44   Ct Chest  Wo Contrast  Result Date: 01/24/2018 CLINICAL DATA:  Chest pain and cough for 2 weeks. History of intravenous drug abuse. EXAM: CT CHEST WITHOUT CONTRAST TECHNIQUE: Multidetector CT imaging of the chest was performed following the standard protocol without IV contrast. COMPARISON:  Chest radiograph January 23, 2018 FINDINGS: CARDIOVASCULAR: Heart and pericardium are unremarkable. Thoracic aorta is normal course and caliber, unremarkable. MEDIASTINUM/NODES: LEFT > RIGHT axillary lymphadenopathy with pericapsular fat stranding. Limited assessment for mediastinal or hilar lymphadenopathy by noncontrast CT. LUNGS/PLEURA: Proximal tracheobronchial tree is patent, lingular bronchus wall thickening and subsequent  obstruction with multifocal collapse. Multifocal consolidation LEFT upper lobe with dominant 6.8 cm air-filled consolidation LEFT upper lobe, apicoposterior segment. Additional patchy ground-glass opacities/subsolid pulmonary nodules LEFT lower lobe, and RIGHT upper and RIGHT lower lobes. No pleural effusion. UPPER ABDOMEN: Nonacute. MUSCULOSKELETAL: Nonacute. Mild degenerative change of the thoracic spine. IMPRESSION: 1. Limited noncontrast CT chest. 2. Multifocal pneumonia with LEFT upper lobe air-containing consolidation seen with atypical infection, abscess and septic emboli. 3. Obstructed lingular bronchus with partial collapse. 4. Reactive LEFT > RIGHT axillary lymphadenopathy. Electronically Signed   By: Awilda Metro M.D.   On: 01/24/2018 05:32    Review of Systems  Constitutional: Negative for weight loss.  HENT: Negative for ear discharge, ear pain, hearing loss and tinnitus.   Eyes: Negative for blurred vision, double vision, photophobia and pain.  Respiratory: Negative for cough, sputum production and shortness of breath.   Cardiovascular: Negative for chest pain.  Gastrointestinal: Negative for abdominal pain, nausea and vomiting.  Genitourinary: Negative for dysuria, flank pain, frequency and urgency.  Musculoskeletal: Positive for joint pain (Bilateral hands). Negative for back pain, falls, myalgias and neck pain.  Neurological: Negative for dizziness, tingling, sensory change, focal weakness, loss of consciousness and headaches.  Endo/Heme/Allergies: Does not bruise/bleed easily.  Psychiatric/Behavioral: Negative for depression, memory loss and substance abuse. The patient is not nervous/anxious.    Blood pressure (!) 122/105, pulse 98, temperature 98.8 F (37.1 C), temperature source Oral, resp. rate (!) 22, height 5\' 9"  (1.753 m), weight 74.8 kg, SpO2 99 %. Physical Exam  Constitutional: She appears well-developed and well-nourished. No distress.  HENT:  Head: Normocephalic  and atraumatic.  Eyes: Conjunctivae are normal. Right eye exhibits no discharge. Left eye exhibits no discharge. No scleral icterus.  Neck: Normal range of motion.  Cardiovascular: Normal rate and regular rhythm.  Respiratory: Effort normal. No respiratory distress.  Musculoskeletal:     Comments: Right shoulder, elbow, wrist, digits- Small abscess dorsum of hand, mod TTP, no instability, no blocks to motion  Sens  Ax/R/M/U intact  Mot   Ax/ R/ PIN/ M/ AIN/ U intact  Rad 2+  Left shoulder, elbow, wrist, digits- no skin wounds, hand edematous, erythematous concentrated at index MCP joint, no instability, mobility limited by pain  Sens  Ax/R/M/U intact  Mot   Ax/ R/ PIN/ M/ AIN/ U intact  Rad 2+  Neurological: She is alert.  Skin: Skin is warm and dry. She is not diaphoretic.  Psychiatric: She has a normal mood and affect. Her behavior is normal.    Assessment/Plan: Bilateral hand infections -- Plan for I&D by Dr. Melvyn Novas this evening. NPO until then.   The patient was seen and evaluated tonight. The patient does have the dorsal abscess on the left hand and the deep space infection on the right hand. The plan is for I&D of both right and left hands. Patient voiced understanding the reason the rationale for the surgery tonight.  Jenny Omdahl Melvyn Novas MD    Freeman Caldron, PA-C Orthopedic Surgery [phone] 01/24/2018, 10:21 AM

## 2018-01-24 NOTE — Progress Notes (Signed)
CSW consulted for safe needle program. CSW attempted to speak with pt at bedside to discuss program as well as further needs. Pt was unable to be awaken by CSW even with tapping pt. CSW left information for [facility] on pt's counter to take to floor with pt once transferred up. CSW will leave handoff for unit CSW asking that they follow up with pt for further needs.      Cheryl Hogan, MSW, LCSW-A Emergency Department Clinical Social Worker [phone]

## 2018-01-24 NOTE — ED Provider Notes (Signed)
[facility] EMERGENCY DEPARTMENT Provider Note  CSN: [id number] Arrival date & time: 01/23/18 2023  Chief Complaint(s) Chest Pain; swollen hand; and detox from drugs  HPI Cheryl Hogan is a 33 y.o. female with a history of heroin abuse who presents to the emergency department with several days of left hand swelling as well as coughing and myalgias.  Patient reports that her boyfriend has flulike symptoms.  She states that he does not use IV drug use.  She does not share needles.  She is endorsing subjective fevers.  She reports that she has been having 2 weeks of chest pain with associated cough.  Chest pain exacerbated with coughing.  Endorsing mild shortness of breath.  Gradually worsening over that timeframe.  HPI  Past Medical History Past Medical History:  Diagnosis Date  . ADD (attention deficit disorder)   . Heroin abuse (HCC)   . IVDU (intravenous drug user)    Patient Active Problem List   Diagnosis Date Noted  . Sepsis (HCC) 01/24/2018  . Heroin abuse (HCC) 01/24/2018  . Abscess of hand 01/24/2018  . Pulmonary abscess (HCC) 01/24/2018   Home Medication(s) Prior to Admission medications   Not on File                                                                                                                                    Past Surgical History History reviewed. No pertinent surgical history. Family History History reviewed. No pertinent family history.  Social History Social History   Tobacco Use  . Smoking status: Current Every Day Smoker  . Smokeless tobacco: Never Used  Substance Use Topics  . Alcohol use: Not Currently  . Drug use: Yes    Types: IV, Heroin   Allergies Patient has no known allergies.  Review of Systems Review of Systems All other systems are reviewed and are negative for acute change except as noted in the HPI  Physical Exam Vital Signs  I have reviewed the triage vital signs BP 121/88   Pulse 91   Temp  98.8 F (37.1 C) (Oral)   Resp (!) 26   Ht 5\' 9"  (1.753 m)   Wt 74.8 kg   SpO2 98%   BMI 24.37 kg/m   Physical Exam Vitals signs reviewed.  Constitutional:      General: She is not in acute distress.    Appearance: She is well-developed. She is not diaphoretic.  HENT:     Head: Normocephalic and atraumatic.     Nose: Nose normal.  Eyes:     General: No scleral icterus.       Right eye: No discharge.        Left eye: No discharge.     Conjunctiva/sclera: Conjunctivae normal.     Pupils: Pupils are equal, round, and reactive to light.  Neck:     Musculoskeletal: Normal range of motion and neck supple.  Cardiovascular:     Rate and Rhythm: Regular rhythm. Tachycardia present.     Heart sounds: Murmur present. Systolic murmur present with a grade of 2/6. No friction rub. No gallop.   Pulmonary:     Effort: Pulmonary effort is normal. No respiratory distress.     Breath sounds: Normal breath sounds. No stridor. No rales.  Abdominal:     General: There is no distension.     Palpations: Abdomen is soft.     Tenderness: There is no abdominal tenderness.  Musculoskeletal:        General: No tenderness.       Arms:  Skin:    General: Skin is warm and dry.     Findings: No erythema or rash.  Neurological:     Mental Status: She is alert and oriented to person, place, and time.     ED Results and Treatments Labs (all labs ordered are listed, but only abnormal results are displayed) Labs Reviewed  BASIC METABOLIC PANEL - Abnormal; Notable for the following components:      Result Value   Sodium 134 (*)    Potassium 3.0 (*)    Glucose, Bld 126 (*)    Calcium 8.4 (*)    All other components within normal limits  CBC - Abnormal; Notable for the following components:   WBC 20.6 (*)    RBC 3.69 (*)    Hemoglobin 9.9 (*)    HCT 31.0 (*)    Platelets 666 (*)    All other components within normal limits  URINALYSIS, ROUTINE W REFLEX MICROSCOPIC - Abnormal; Notable for the  following components:   Specific Gravity, Urine 1.004 (*)    Hgb urine dipstick MODERATE (*)    Bacteria, UA RARE (*)    All other components within normal limits  I-STAT BETA HCG BLOOD, ED ([facility], [facility], [facility] ONLY) - Abnormal; Notable for the following components:   I-stat hCG, quantitative 9.4 (*)    All other components within normal limits  CULTURE, BLOOD (ROUTINE X 2)  CULTURE, BLOOD (ROUTINE X 2)  CULTURE, BLOOD (SINGLE)  LACTIC ACID, PLASMA  PROCALCITONIN  INFLUENZA PANEL BY PCR (TYPE A & B)  HCG, QUANTITATIVE, PREGNANCY  LACTIC ACID, PLASMA  RAPID URINE DRUG SCREEN, HOSP PERFORMED  HIV ANTIBODY (ROUTINE TESTING W REFLEX)  I-STAT TROPONIN, ED                                                                                                                         EKG  EKG Interpretation  Date/Time:  Tuesday January 23 2018 20:31:36 EST Ventricular Rate:  107 PR Interval:  136 QRS Duration: 92 QT Interval:  344 QTC Calculation: 459 R Axis:   100 Text Interpretation:  Sinus tachycardia Rightward axis Borderline ECG No old tracing to compare Confirmed by Drema Pryardama,  [phone]([id number]) on 01/24/2018 2:56:01 AM Also confirmed by Drema Pryardama,  [phone]), editor Barbette Hairassel, Kerry [phone])  on 01/24/2018 7:43:15 AM  Radiology Dg Chest 2 View  Result Date: 01/23/2018 CLINICAL DATA:  Chest pain x2 weeks. Patient states she needs detoxification from drugs. Swollen hand with track marks. EXAM: CHEST - 2 VIEW COMPARISON:  None. FINDINGS: Heart size and mediastinal contours are within normal limits. Pulmonary consolidations in the lingula and left upper lobe with air fluid level in the left upper lobe consolidation raises concern for multifocal pneumonia with cavitary component in the left upper lobe. Differential considerations would include pulmonary abscess, less likely septic emboli given the unilateral appearance of this process. No acute osseous abnormality. No effusion or pneumothorax. IMPRESSION:  Pulmonary consolidations in the lingula and left upper lobe. Findings raise concern for multilobar pneumonia with cavitary component in the left upper lobe. Differential considerations would include pulmonary abscess, less likely septic emboli or cavitary mass. Electronically Signed   By: Tollie Ethavid  Kwon M.D.   On: 01/23/2018 21:44   Ct Chest Wo Contrast  Result Date: 01/24/2018 CLINICAL DATA:  Chest pain and cough for 2 weeks. History of intravenous drug abuse. EXAM: CT CHEST WITHOUT CONTRAST TECHNIQUE: Multidetector CT imaging of the chest was performed following the standard protocol without IV contrast. COMPARISON:  Chest radiograph January 23, 2018 FINDINGS: CARDIOVASCULAR: Heart and pericardium are unremarkable. Thoracic aorta is normal course and caliber, unremarkable. MEDIASTINUM/NODES: LEFT > RIGHT axillary lymphadenopathy with pericapsular fat stranding. Limited assessment for mediastinal or hilar lymphadenopathy by noncontrast CT. LUNGS/PLEURA: Proximal tracheobronchial tree is patent, lingular bronchus wall thickening and subsequent obstruction with multifocal collapse. Multifocal consolidation LEFT upper lobe with dominant 6.8 cm air-filled consolidation LEFT upper lobe, apicoposterior segment. Additional patchy ground-glass opacities/subsolid pulmonary nodules LEFT lower lobe, and RIGHT upper and RIGHT lower lobes. No pleural effusion. UPPER ABDOMEN: Nonacute. MUSCULOSKELETAL: Nonacute. Mild degenerative change of the thoracic spine. IMPRESSION: 1. Limited noncontrast CT chest. 2. Multifocal pneumonia with LEFT upper lobe air-containing consolidation seen with atypical infection, abscess and septic emboli. 3. Obstructed lingular bronchus with partial collapse. 4. Reactive LEFT > RIGHT axillary lymphadenopathy. Electronically Signed   By: Awilda Metroourtnay  Bloomer M.D.   On: 01/24/2018 05:32   Pertinent labs & imaging results that were available during my care of the patient were reviewed by me and considered  in my medical decision making (see chart for details).  Medications Ordered in ED Medications  metroNIDAZOLE (FLAGYL) IVPB 500 mg (0 mg Intravenous Stopped 01/24/18 0302)  potassium chloride SA (K-DUR,KLOR-CON) CR tablet 40 mEq (0 mEq Oral Hold 01/24/18 0444)  buprenorphine-naloxone (SUBOXONE) 2-0.5 mg per SL tablet 2 tablet (has no administration in time range)  acetaminophen (TYLENOL) tablet 650 mg (has no administration in time range)    Or  acetaminophen (TYLENOL) suppository 650 mg (has no administration in time range)  ondansetron (ZOFRAN) tablet 4 mg (has no administration in time range)    Or  ondansetron (ZOFRAN) injection 4 mg (has no administration in time range)  0.9 %  sodium chloride infusion ( Intravenous New Bag/Given 01/24/18 0536)  nicotine (NICODERM CQ - dosed in mg/24 hours) patch 21 mg (0 mg Transdermal Hold 01/24/18 0537)  buprenorphine-naloxone (SUBOXONE) 8-2 mg per SL tablet 1 tablet (1 tablet Sublingual Given 01/24/18 0522)  vancomycin (VANCOCIN) IVPB 1000 mg/200 mL premix (has no administration in time range)  ceFEPIme (MAXIPIME) 1 g in sodium chloride 0.9 % 100 mL IVPB (has no administration in time range)  buprenorphine-naloxone (SUBOXONE) 2-0.5 mg per SL tablet 2 tablet (has no administration in time range)  sodium chloride flush (NS) 0.9 % injection 3 mL (  3 mLs Intravenous Given 01/24/18 0130)  ceFEPIme (MAXIPIME) 2 g in sodium chloride 0.9 % 100 mL IVPB (0 g Intravenous Stopped 01/24/18 0302)  vancomycin (VANCOCIN) IVPB 1000 mg/200 mL premix (0 mg Intravenous Stopped 01/24/18 0522)  LORazepam (ATIVAN) injection 1 mg (1 mg Intravenous Given 01/24/18 0412)                                                                                                                                    Procedures Procedures CRITICAL CARE Performed by: Amadeo Garnet  Total critical care time: 35 minutes Critical care time was exclusive of separately billable procedures and  treating other patients. Critical care was necessary to treat or prevent imminent or life-threatening deterioration. Critical care was time spent personally by me on the following activities: development of treatment plan with patient and/or surrogate as well as nursing, discussions with consultants, evaluation of patient's response to treatment, examination of patient, obtaining history from patient or surrogate, ordering and performing treatments and interventions, ordering and review of laboratory studies, ordering and review of radiographic studies, pulse oximetry and re-evaluation of patient's condition.   (including critical care time)  Medical Decision Making / ED Course I have reviewed the nursing notes for this encounter and the patient's prior records (if available in EHR or on provided paperwork).    Patient is currently afebrile.  Labs with significant leukocytosis.  Chest x-ray concerning for multifocal pneumonia and also notable for cavitary lesion.  Given a history of IV drug use with evidence of cellulitis and abscess with possible heart murmur, there is a concern for endocarditis.  Code sepsis was initiated and patient was started on empiric antibiotics.  3 blood cultures were drawn.  Patient was admitted to medicine for continued work-up and management.  Final Clinical Impression(s) / ED Diagnoses Final diagnoses:  Sepsis without acute organ dysfunction, due to unspecified organism [facility])  Multifocal pneumonia  Cellulitis of left hand  Abscess of right hand  IVDU (intravenous drug user)      This chart was dictated using voice recognition software.  Despite best efforts to proofread,  errors can occur which can change the documentation meaning.   Nira Conn, MD 01/24/18 [id number]

## 2018-01-24 NOTE — Op Note (Signed)
PREOPERATIVE DIAGNOSIS:left hand index finger deep abscess Right hand subcutaneous abscess  POSTOPERATIVE DIAGNOSIS:same  ATTENDING SURGEON:Dr. Gilman Schmidt who was scrubbed and present for the entire procedure  ASSISTANT SURGEON:none  ANESTHESIA:Gen. Via endotracheal tube  OPERATIVE PROCEDURE: #1: Left hand extensor tendon tenosynovectomy EIP and EDC to the index finger aggressive tenosynovectomy #2: Left hand incision and drainage of complex abscess dorsal finger and hand #3: Right hand drainage of subcutaneous abscess dorsal right hand  IMPLANTS:none  RADIOGRAPHIC INTERPRETATION:none  SURGICAL INDICATIONS:patient is a right-hand-dominant female who is an IV drug user had multiple abscesses on the dorsum of the right hand and over the index finger of the left. Patient elected undergo the above procedure. Risks benefits and alternatives were discussed with the patient and signed informed consent was obtained  SURGICAL TECHNIQUE:patient is properly identified in the preoperative holding area marked made on both hands indicate correct operative sites. Patient brought back to operating room placed supine on anesthesia and table where general anesthesia was administered. Patient been on preoperative antibiotics. A well-padded tourniquet was then placed on the left forearm and sealed with the appropriate drape. Left upper extremity and prepped and draped in normal sterile fashion timeout was called the correct site was was identified and the procedure then begun. Attention then turned the left hand longitudinal incision made directly over the index finger MP joint. Dissection carried down through the skin and subcutaneous tissue and gross purulence was encountered. Intraoperative wound cultures were then taken. Aggressive tenosynovectomy was then carried out of the hand and in the finger removing the devitalized tissue and grossly purulent region of the index finger tendons. Careful preservation of  the sagittal band was done throughout. Copious wound irrigation done throughout. After thorough wound irrigation the wound was then loosely closed with simple Prolene suture. Attention was then turned the contralateral side a longitudinal incision made directly over the dorsal abscess out to the area been prepped and draped in normal sterile fashion gross purulence was encountered of the subcutaneous abscess the small vein dorsally was also excised. The wound was then thoroughly irrigated loosely closed with simple Prolene sutures 1 suture. Sterile compressive bandage then applied. The patient tolerated the procedure well  Patient was placed in well-padded volar splint on the left side.  POSTOPERATIVE PLAN:patient be admitted back to the internal medicine hospitalist service. Patient will need to have her bandage is removed on Friday in undergo wound check and re-splinting application on the left side and need dressing on the right side.  Continue on the IV antibiotics. I'll need to see her back in the office next week

## 2018-01-24 NOTE — ED Notes (Signed)
Pt was found smoking in restroom. Staff advised pt smoking is not allowed in hospital.

## 2018-01-24 NOTE — Progress Notes (Signed)
Responded to spiritual Care Consult to support patient and staff.  Spoke with patient nurse prior to visiting with patient. Patient sleeping.  Per nurse patient is withdrawn and currently only responding to her name. Nurse suggested if patient status changes, a later visit may be helpful. Nurse will page chaplain as needed.    Venida Jarvis, [place], [facility], Pager [phone]

## 2018-01-24 NOTE — Anesthesia Preprocedure Evaluation (Addendum)
Anesthesia Evaluation  Patient identified by MRN, date of birth, ID band Patient awake    Reviewed: Allergy & Precautions, NPO status , Patient's Chart, lab work & pertinent test results  History of Anesthesia Complications Negative for: history of anesthetic complications  Airway Mallampati: II  TM Distance: >3 FB Neck ROM: Full    Dental  (+) Teeth Intact, Dental Advisory Given   Pulmonary Current Smoker,    breath sounds clear to auscultation       Cardiovascular negative cardio ROS   Rhythm:Regular Rate:Tachycardia     Neuro/Psych negative neurological ROS  negative psych ROS   GI/Hepatic negative GI ROS, (+)     substance abuse  cocaine use, marijuana use, methamphetamine use and IV drug use,   Endo/Other   Hypokalemia Hypocalcemia   Renal/GU negative Renal ROS     Musculoskeletal negative musculoskeletal ROS (+)   Abdominal   Peds  (+) ATTENTION DEFICIT DISORDER WITHOUT HYPERACTIVITY Hematology  (+) anemia ,   Anesthesia Other Findings   Reproductive/Obstetrics                         Anesthesia Physical Anesthesia Plan  ASA: II and emergent  Anesthesia Plan: General   Post-op Pain Management:    Induction: Intravenous  PONV Risk Score and Plan: 3 and Treatment may vary due to age or medical condition, Ondansetron, Dexamethasone and Midazolam  Airway Management Planned: Oral ETT  Additional Equipment: None  Intra-op Plan:   Post-operative Plan: Extubation in OR  Informed Consent: I have reviewed the patients History and Physical, chart, labs and discussed the procedure including the risks, benefits and alternatives for the proposed anesthesia with the patient or authorized representative who has indicated his/her understanding and acceptance.     Dental advisory given  Plan Discussed with: CRNA and Anesthesiologist  Anesthesia Plan Comments:         Anesthesia Quick Evaluation

## 2018-01-24 NOTE — ED Notes (Signed)
Pt caught smoking in bathroom

## 2018-01-24 NOTE — Anesthesia Procedure Notes (Signed)
Procedure Name: Intubation Date/Time: 01/24/2018 6:40 PM Performed by: Claris Che, CRNA Pre-anesthesia Checklist: Patient identified, Emergency Drugs available, Suction available, Patient being monitored and Timeout performed Patient Re-evaluated:Patient Re-evaluated prior to induction Oxygen Delivery Method: Circle system utilized Preoxygenation: Pre-oxygenation with 100% oxygen Induction Type: IV induction, Rapid sequence and Cricoid Pressure applied Laryngoscope Size: Mac and 3 Tube size: 7.5 mm Number of attempts: 1 Airway Equipment and Method: Stylet Placement Confirmation: ETT inserted through vocal cords under direct vision,  positive ETCO2 and breath sounds checked- equal and bilateral Secured at: 22 cm Tube secured with: Tape Dental Injury: Teeth and Oropharynx as per pre-operative assessment

## 2018-01-24 NOTE — H&P (Addendum)
History and Physical    Cheryl Hogan AOZ:[id number] DOB: March 08, 1985 DOA: 01/23/2018  PCP: Patient, [name]  Patient coming from: Home  I have personally briefly reviewed patient's old medical records in [facility]  Chief Complaint: CP, swollen hand, detox  HPI: Cheryl Hogan is a 33 y.o. female with medical history significant of IVDU heroin abuse.  Last use was 2h PTA today in ED.  Uses ~2gm / day.  No other illicit substances, does smoke, no EtOH currently.  Patient reports having CP for past 2 weeks, associated cough.  Also has swollen L hand as well as abscess on dorsal aspect of R hand.  Symptoms are severe, worsening, nothing makes better or worse.   ED Course: WBC 20k, CXR shows L lung cavitary lesions, probably pulmonary abscess vs septic emboli.  Procalcitonin 1.0.   Review of Systems: As per HPI otherwise 10 point review of systems negative.   Past Medical History:  Diagnosis Date  . ADD (attention deficit disorder)   . Heroin abuse (HCC)   . IVDU (intravenous drug user)     History reviewed. No pertinent surgical history.   reports that she has been smoking. She has never used smokeless tobacco. She reports previous alcohol use. She reports current drug use. Drugs: IV and Heroin.  No Known Allergies  History reviewed. No pertinent family history. No other substance abuse in household.  Boyfriend has had flu like symptoms recently.  Prior to Admission medications   Not on File    Physical Exam: Vitals:   01/23/18 2032 01/24/18 0053 01/24/18 0100 01/24/18 0201  BP: 129/84 119/72 117/76   Pulse: (!) 105 83 87   Resp: 18 (!) 22 (!) 27   Temp: 98.6 F (37 C)     TempSrc: Oral     SpO2: 99% 100% 95%   Weight:    74.8 kg  Height:    5\' 9"  (1.753 m)    Constitutional: Restless Eyes: PERRL, lids and conjunctivae normal ENMT: Mucous membranes are moist. Posterior pharynx clear of any exudate or lesions.Normal dentition.  Neck: normal, supple, no  masses, no thyromegaly Respiratory: Cough, tachypnea,  Cardiovascular: Regular rate and rhythm, no murmurs / rubs / gallops. No extremity edema. 2+ pedal pulses. No carotid bruits.  Abdomen: no tenderness, no masses palpated. No hepatosplenomegaly. Bowel sounds positive.  Musculoskeletal: no clubbing / cyanosis. No joint deformity upper and lower extremities. Good ROM, no contractures. Normal muscle tone.  Skin: Mild swelling of L hand, Abscess on dorsum of R hand Neurologic: CN 2-12 grossly intact. Sensation intact, DTR normal. Strength 5/5 in all 4.  Psychiatric: Normal judgment and insight. Alert and oriented x 3. Normal mood.    Labs on Admission: I have personally reviewed following labs and imaging studies  CBC: Recent Labs  Lab 01/23/18 2041  WBC 20.6*  HGB 9.9*  HCT 31.0*  MCV 84.0  PLT 666*   Basic Metabolic Panel: Recent Labs  Lab 01/23/18 2041  NA 134*  K 3.0*  CL 99  CO2 27  GLUCOSE 126*  BUN 8  CREATININE 0.50  CALCIUM 8.4*   GFR: Estimated Creatinine Clearance: 107.5 mL/min (by C-G formula based on SCr of 0.5 mg/dL). Liver Function Tests: No results for input(s): AST, ALT, ALKPHOS, BILITOT, PROT, ALBUMIN in the last 168 hours. No results for input(s): LIPASE, AMYLASE in the last 168 hours. No results for input(s): AMMONIA in the last 168 hours. Coagulation Profile: No results for input(s): INR, PROTIME  in the last 168 hours. Cardiac Enzymes: No results for input(s): CKTOTAL, CKMB, CKMBINDEX, TROPONINI in the last 168 hours. BNP (last 3 results) No results for input(s): PROBNP in the last 8760 hours. HbA1C: No results for input(s): HGBA1C in the last 72 hours. CBG: No results for input(s): GLUCAP in the last 168 hours. Lipid Profile: No results for input(s): CHOL, HDL, LDLCALC, TRIG, CHOLHDL, LDLDIRECT in the last 72 hours. Thyroid Function Tests: No results for input(s): TSH, T4TOTAL, FREET4, T3FREE, THYROIDAB in the last 72 hours. Anemia  Panel: No results for input(s): VITAMINB12, FOLATE, FERRITIN, TIBC, IRON, RETICCTPCT in the last 72 hours. Urine analysis:    Component Value Date/Time   COLORURINE YELLOW 01/23/2018 2335   APPEARANCEUR CLEAR 01/23/2018 2335   LABSPEC 1.004 (L) 01/23/2018 2335   PHURINE 7.0 01/23/2018 2335   GLUCOSEU NEGATIVE 01/23/2018 2335   HGBUR MODERATE (A) 01/23/2018 2335   BILIRUBINUR NEGATIVE 01/23/2018 2335   KETONESUR NEGATIVE 01/23/2018 2335   PROTEINUR NEGATIVE 01/23/2018 2335   NITRITE NEGATIVE 01/23/2018 2335   LEUKOCYTESUR NEGATIVE 01/23/2018 2335    Radiological Exams on Admission: Dg Chest 2 View  Result Date: 01/23/2018 CLINICAL DATA:  Chest pain x2 weeks. Patient states she needs detoxification from drugs. Swollen hand with track marks. EXAM: CHEST - 2 VIEW COMPARISON:  None. FINDINGS: Heart size and mediastinal contours are within normal limits. Pulmonary consolidations in the lingula and left upper lobe with air fluid level in the left upper lobe consolidation raises concern for multifocal pneumonia with cavitary component in the left upper lobe. Differential considerations would include pulmonary abscess, less likely septic emboli given the unilateral appearance of this process. No acute osseous abnormality. No effusion or pneumothorax. IMPRESSION: Pulmonary consolidations in the lingula and left upper lobe. Findings raise concern for multilobar pneumonia with cavitary component in the left upper lobe. Differential considerations would include pulmonary abscess, less likely septic emboli or cavitary mass. Electronically Signed   By: Tollie Ethavid  Kwon M.D.   On: 01/23/2018 21:44    EKG: Independently reviewed.  Assessment/Plan Principal Problem:   Sepsis (HCC) Active Problems:   Heroin abuse (HCC)   Abscess of hand   Pulmonary abscess (HCC)    1. Sepsis - with pulmonary cavitary lesion / abscess / septic emboli / other seen on CXR 1. CT chest non-contrast to better define the CXR  abnormalities 2. IVF: NS at 100 cc/hr 3. BCx pending 4. Empiric cefepime, flagyl, vanc for now 5. Ultimately suspect she will be getting a TEE during this admission. 6. HIV pending 2. Abscess of hand - 1. Call hand surgery in AM to evaluate 2. NPO and SCDs 3. b-HCG - 1. barely positive at 9.4, this would put her at less than 1 week 2. At this point focused on treating patient's life threatening illness as though not pregnant 3. May wish to check serial beta-HCG in a few days. 4. Replacing K 5. Heroin abuse - 1. Buprenorphine scheduled and PRN as per opiate withdrawal treatment protocol 2. Tele monitor 6. Smoking - 1. Nicotine patch  DVT prophylaxis: SCDs until hand surgery evaluates Code Status: Full Family Communication: No family in room Disposition Plan: TBD Consults called: None Admission status: Admit to inpatient  Severity of Illness: The appropriate patient status for this patient is INPATIENT. Inpatient status is judged to be reasonable and necessary in order to provide the required intensity of service to ensure the patient's safety. The patient's presenting symptoms, physical exam findings, and initial radiographic and laboratory  data in the context of their chronic comorbidities is felt to place them at high risk for further clinical deterioration. Furthermore, it is not anticipated that the patient will be medically stable for discharge from the hospital within 2 midnights of admission. The following factors support the patient status of inpatient.   " The patient's presenting symptoms include Cough, CP, hand abscesses, IVDU. " The worrisome physical exam findings include hand abscess, cough, tachypnea, tachycardia. " The initial radiographic and laboratory data are worrisome because of WBC elevation, pulmonary abscess vs cavitary lesion vs septic emboli seen on CXR. " The chronic co-morbidities include IVDU, heroin abuse.   * I certify that at the point of admission  it is my clinical judgment that the patient will require inpatient hospital care spanning beyond 2 midnights from the point of admission due to high intensity of service, high risk for further deterioration and high frequency of surveillance required.Hillary Bow DO [facility] Pager [phone] Only works nights!  If 7AM-7PM, please contact the primary day team physician taking care of patient  [email or web address] Password [email or web address]  01/24/2018, 4:18 AM

## 2018-01-25 ENCOUNTER — Encounter (HOSPITAL_COMMUNITY): Payer: Self-pay | Admitting: Orthopedic Surgery

## 2018-01-25 ENCOUNTER — Inpatient Hospital Stay (HOSPITAL_COMMUNITY): Payer: Self-pay

## 2018-01-25 DIAGNOSIS — L03114 Cellulitis of left upper limb: Secondary | ICD-10-CM

## 2018-01-25 DIAGNOSIS — R7881 Bacteremia: Secondary | ICD-10-CM

## 2018-01-25 DIAGNOSIS — F1990 Other psychoactive substance use, unspecified, uncomplicated: Secondary | ICD-10-CM

## 2018-01-25 LAB — BASIC METABOLIC PANEL
Anion gap: 9 (ref 5–15)
BUN: <5 mg/dL — ABNORMAL LOW (ref 6–20)
CO2: 24 mmol/L (ref 22–32)
Calcium: 7.9 mg/dL — ABNORMAL LOW (ref 8.9–10.3)
Chloride: 106 mmol/L (ref 98–111)
Creatinine, Ser: 0.34 mg/dL — ABNORMAL LOW (ref 0.44–1.00)
GFR calc Af Amer: >60 mL/min (ref >60)
GFR calc non Af Amer: >60 mL/min (ref >60)
GLUCOSE: 110 mg/dL — AB (ref 70–99)
Potassium: 3 mmol/L — ABNORMAL LOW (ref 3.5–5.1)
Sodium: 139 mmol/L (ref 135–145)

## 2018-01-25 LAB — CBC
HEMATOCRIT: 29 % — AB (ref 36.0–46.0)
Hemoglobin: 9.4 g/dL — ABNORMAL LOW (ref 12.0–15.0)
MCH: 26.7 pg (ref 26.0–34.0)
MCHC: 32.4 g/dL (ref 30.0–36.0)
MCV: 82.4 fL (ref 80.0–100.0)
Platelets: 648 10*3/uL — ABNORMAL HIGH (ref 150–400)
RBC: 3.52 MIL/uL — ABNORMAL LOW (ref 3.87–5.11)
RDW: 15.4 % (ref 11.5–15.5)
WBC: 23 10*3/uL — ABNORMAL HIGH (ref 4.0–10.5)
nRBC: 0 % (ref 0.0–0.2)

## 2018-01-25 LAB — ECHOCARDIOGRAM COMPLETE
Height: 69 in
Weight: 2361.6 oz

## 2018-01-25 MED ORDER — IPRATROPIUM-ALBUTEROL 0.5-2.5 (3) MG/3ML IN SOLN: 3.0000 mL | Freq: Four times a day (QID) | RESPIRATORY_TRACT | Status: DC

## 2018-01-25 MED ORDER — IBUPROFEN 600 MG PO TABS
600.0000 mg | ORAL_TABLET | Freq: Four times a day (QID) | ORAL | Status: DC | PRN
  Administered 2018-01-25 – 2018-01-26 (×2): 600 mg via ORAL
  Filled 2018-01-25 (×2): qty 1

## 2018-01-25 MED ORDER — ENOXAPARIN SODIUM 40 MG/0.4ML ~~LOC~~ SOLN
40.0000 mg | SUBCUTANEOUS | Status: DC
  Administered 2018-01-25 – 2018-01-30 (×5): 40 mg via SUBCUTANEOUS
  Filled 2018-01-25 (×5): qty 0.4

## 2018-01-25 MED ORDER — POTASSIUM CHLORIDE CRYS ER 20 MEQ PO TBCR
40.0000 meq | EXTENDED_RELEASE_TABLET | Freq: Once | ORAL | Status: AC
  Administered 2018-01-25: 40 meq via ORAL
  Filled 2018-01-25: qty 2

## 2018-01-25 MED ORDER — IPRATROPIUM-ALBUTEROL 0.5-2.5 (3) MG/3ML IN SOLN
3.0000 mL | Freq: Two times a day (BID) | RESPIRATORY_TRACT | Status: DC
  Administered 2018-01-26 (×2): 3 mL via RESPIRATORY_TRACT
  Filled 2018-01-25 (×2): qty 3

## 2018-01-25 MED ORDER — MORPHINE SULFATE (PF) 2 MG/ML IV SOLN: 1.0000 mg | INTRAVENOUS | Status: DC | PRN

## 2018-01-25 MED ORDER — VANCOMYCIN HCL IN DEXTROSE 1-5 GM/200ML-% IV SOLN
1000.0000 mg | Freq: Three times a day (TID) | INTRAVENOUS | Status: DC
  Administered 2018-01-25 – 2018-01-26 (×4): 1000 mg via INTRAVENOUS
  Filled 2018-01-25 (×6): qty 200

## 2018-01-25 MED ORDER — IPRATROPIUM-ALBUTEROL 0.5-2.5 (3) MG/3ML IN SOLN
3.0000 mL | Freq: Four times a day (QID) | RESPIRATORY_TRACT | Status: DC
  Administered 2018-01-25 (×2): 3 mL via RESPIRATORY_TRACT
  Filled 2018-01-25 (×2): qty 3

## 2018-01-25 NOTE — Progress Notes (Signed)
PROGRESS NOTE    Cheryl Hogan  BJY:[id number] DOB: 10-Jul-1985 DOA: 01/23/2018 PCP: Patient, [name]  Brief Narrative: 33 year old female with history of active IV heroin abuse presented to the ED with swelling abscess overlying right hand dorsal surface and left hand involving index finger. -Chest x-ray was also concerning for septic pulmonary emboli  Assessment & Plan:   Sepsis, right hand dorsal abscess and left hand index finger tenosynovitis -Continue IV vancomycin and cefepime -Discontinue Flagyl -Blood cultures negative thus far -Orthopedics consulting, status post I&D of right hand dorsal abscess and tenosynovectomy of left hand index finger yesterday 1/22 -Follow-up OR cultures  Septic emboli/obstructed lingular abscess -Continue above antibiotics, follow-up blood cultures -Check 2D echocardiogram -Add nebs, chest PT -Repeat x-ray to assess lingula  Positive beta hCG -Repeat is negative  Heroin abuse -Continue buprenorphine for opiate withdrawal treatment protocol  Tobacco abuse -Nicotine patch  DVT prophylaxis: Add Lovenox Code Status: Full code Family Communication: Spouse at bedside Disposition Plan: Home pending above work-up  Consultants:   Hand surgery Dr. Melvyn Novas  Procedures: OPERATIVE PROCEDURE: #1: Left hand extensor tendon tenosynovectomy EIP and EDC to the index finger aggressive tenosynovectomy #2: Left hand incision and drainage of complex abscess dorsal finger and hand  #3: Right hand drainage of subcutaneous abscess dorsal right hand  Antimicrobials:    Subjective: - still feels pretty miserable but improving, complains of pain in both hands Objective: Vitals:   01/24/18 2324 01/25/18 0328 01/25/18 0737 01/25/18 1118  BP: 122/74 127/79 128/73 138/78  Pulse: 79 83 90 61  Resp: (!) 21   12  Temp: 99.8 F (37.7 C) 99.3 F (37.4 C) 98.9 F (37.2 C) (!) 97.5 F (36.4 C)  TempSrc: Oral Oral Oral Axillary  SpO2: 98% 100% 100% 100%    Weight:  67 kg    Height:        Intake/Output Summary (Last 24 hours) at 01/25/2018 1400 Last data filed at 01/25/2018 0900 Gross per 24 hour  Intake 3308.36 ml  Output 4 ml  Net 3304.36 ml   Filed Weights   01/24/18 0201 01/24/18 1200 01/25/18 0328  Weight: 74.8 kg 65.7 kg 67 kg    Examination:  General exam: Appears calm and comfortable, less anxious today, sweating Respiratory system: Clear to auscultation. Respiratory effort normal. Cardiovascular system: S1 & S2 heard, RRR, tachycardic  Gastrointestinal system: Abdomen is nondistended, soft and nontender.Normal bowel sounds heard. Central nervous system: Alert and oriented. No focal neurological deficits. Extremities: both hands with dressing Skin: No rashes, lesions or ulcers Psychiatry: Mood & affect appropriate.     Data Reviewed:   CBC: Recent Labs  Lab 01/23/18 2041 01/25/18 0430  WBC 20.6* 23.0*  HGB 9.9* 9.4*  HCT 31.0* 29.0*  MCV 84.0 82.4  PLT 666* 648*   Basic Metabolic Panel: Recent Labs  Lab 01/23/18 2041 01/25/18 0430  NA 134* 139  K 3.0* 3.0*  CL 99 106  CO2 27 24  GLUCOSE 126* 110*  BUN 8 <5*  CREATININE 0.50 0.34*  CALCIUM 8.4* 7.9*   GFR: Estimated Creatinine Clearance: 107.5 mL/min (A) (by C-G formula based on SCr of 0.34 mg/dL (L)). Liver Function Tests: No results for input(s): AST, ALT, ALKPHOS, BILITOT, PROT, ALBUMIN in the last 168 hours. No results for input(s): LIPASE, AMYLASE in the last 168 hours. No results for input(s): AMMONIA in the last 168 hours. Coagulation Profile: No results for input(s): INR, PROTIME in the last 168 hours. Cardiac Enzymes: No results for  input(s): CKTOTAL, CKMB, CKMBINDEX, TROPONINI in the last 168 hours. BNP (last 3 results) No results for input(s): PROBNP in the last 8760 hours. HbA1C: No results for input(s): HGBA1C in the last 72 hours. CBG: No results for input(s): GLUCAP in the last 168 hours. Lipid Profile: No results for  input(s): CHOL, HDL, LDLCALC, TRIG, CHOLHDL, LDLDIRECT in the last 72 hours. Thyroid Function Tests: No results for input(s): TSH, T4TOTAL, FREET4, T3FREE, THYROIDAB in the last 72 hours. Anemia Panel: No results for input(s): VITAMINB12, FOLATE, FERRITIN, TIBC, IRON, RETICCTPCT in the last 72 hours. Urine analysis:    Component Value Date/Time   COLORURINE YELLOW 01/23/2018 2335   APPEARANCEUR CLEAR 01/23/2018 2335   LABSPEC 1.004 (L) 01/23/2018 2335   PHURINE 7.0 01/23/2018 2335   GLUCOSEU NEGATIVE 01/23/2018 2335   HGBUR MODERATE (A) 01/23/2018 2335   BILIRUBINUR NEGATIVE 01/23/2018 2335   KETONESUR NEGATIVE 01/23/2018 2335   PROTEINUR NEGATIVE 01/23/2018 2335   NITRITE NEGATIVE 01/23/2018 2335   LEUKOCYTESUR NEGATIVE 01/23/2018 2335   Sepsis Labs: @LABRCNTIP (procalcitonin:4,lacticidven:4)  ) Recent Results (from the past 240 hour(s))  Blood Culture (routine x 2)     Status: None (Preliminary result)   Collection Time: 01/24/18  1:05 AM  Result Value Ref Range Status   Specimen Description BLOOD RIGHT ANTECUBITAL  Final   Special Requests   Final    BOTTLES DRAWN AEROBIC AND ANAEROBIC Blood Culture adequate volume   Culture   Final    NO GROWTH 1 DAY Performed at [facility], [address] [place], [place] [address]    Report Status PENDING  Incomplete  Blood Culture (routine x 2)     Status: None (Preliminary result)   Collection Time: 01/24/18  1:26 AM  Result Value Ref Range Status   Specimen Description BLOOD LEFT ANTECUBITAL  Final   Special Requests   Final    BOTTLES DRAWN AEROBIC AND ANAEROBIC Blood Culture results may not be optimal due to an inadequate volume of blood received in culture bottles   Culture   Final    NO GROWTH 1 DAY Performed at [facility], [address] [place], [place] [address]    Report Status PENDING  Incomplete  Surgical pcr screen     Status: Abnormal   Collection Time: 01/24/18 12:15 PM  Result Value Ref Range  Status   MRSA, PCR POSITIVE (A) NEGATIVE Final    Comment: RESULT CALLED TO, READ BACK BY AND VERIFIED WITH: Lavell LusterS. Barnhill RN 16:05 01/24/18 ([email or web address])    Staphylococcus aureus POSITIVE (A) NEGATIVE Final    Comment: (NOTE) The Xpert SA Assay (FDA approved for NASAL specimens in patients 722 years of age and older), is one component of a comprehensive surveillance program. It is not intended to diagnose infection nor to guide or monitor treatment. Performed at [facility], [address] [place], [place] [address]   Aerobic/Anaerobic Culture (surgical/deep wound)     Status: None (Preliminary result)   Collection Time: 01/24/18  7:01 PM  Result Value Ref Range Status   Specimen Description WOUND LEFT HAND  Final   Special Requests SAMPLE A  Final   Gram Stain   Final    MODERATE WBC PRESENT,BOTH PMN AND MONONUCLEAR FEW GRAM POSITIVE COCCI    Culture   Final    TOO YOUNG TO READ Performed at [facility], [address] [place], [place] [address]    Report Status PENDING  Incomplete  Aerobic/Anaerobic  Culture (surgical/deep wound)     Status: None (Preliminary result)   Collection Time: 01/24/18  7:02 PM  Result Value Ref Range Status   Specimen Description TISSUE RIGHT HAND  Final   Special Requests SAMPLE B  Final   Gram Stain   Final    ABUNDANT WBC PRESENT, PREDOMINANTLY PMN RARE GRAM POSITIVE COCCI    Culture   Final    TOO YOUNG TO READ Performed at [facility], [address] [place], [place] [address]    Report Status PENDING  Incomplete         Radiology Studies: Dg Chest 2 View  Result Date: 01/23/2018 CLINICAL DATA:  Chest pain x2 weeks. Patient states she needs detoxification from drugs. Swollen hand with track marks. EXAM: CHEST - 2 VIEW COMPARISON:  None. FINDINGS: Heart size and mediastinal contours are within normal limits. Pulmonary consolidations in the lingula and left upper lobe with air fluid level in the left upper lobe  consolidation raises concern for multifocal pneumonia with cavitary component in the left upper lobe. Differential considerations would include pulmonary abscess, less likely septic emboli given the unilateral appearance of this process. No acute osseous abnormality. No effusion or pneumothorax. IMPRESSION: Pulmonary consolidations in the lingula and left upper lobe. Findings raise concern for multilobar pneumonia with cavitary component in the left upper lobe. Differential considerations would include pulmonary abscess, less likely septic emboli or cavitary mass. Electronically Signed   By: Tollie Eth M.D.   On: 01/23/2018 21:44   Ct Chest Wo Contrast  Result Date: 01/24/2018 CLINICAL DATA:  Chest pain and cough for 2 weeks. History of intravenous drug abuse. EXAM: CT CHEST WITHOUT CONTRAST TECHNIQUE: Multidetector CT imaging of the chest was performed following the standard protocol without IV contrast. COMPARISON:  Chest radiograph January 23, 2018 FINDINGS: CARDIOVASCULAR: Heart and pericardium are unremarkable. Thoracic aorta is normal course and caliber, unremarkable. MEDIASTINUM/NODES: LEFT > RIGHT axillary lymphadenopathy with pericapsular fat stranding. Limited assessment for mediastinal or hilar lymphadenopathy by noncontrast CT. LUNGS/PLEURA: Proximal tracheobronchial tree is patent, lingular bronchus wall thickening and subsequent obstruction with multifocal collapse. Multifocal consolidation LEFT upper lobe with dominant 6.8 cm air-filled consolidation LEFT upper lobe, apicoposterior segment. Additional patchy ground-glass opacities/subsolid pulmonary nodules LEFT lower lobe, and RIGHT upper and RIGHT lower lobes. No pleural effusion. UPPER ABDOMEN: Nonacute. MUSCULOSKELETAL: Nonacute. Mild degenerative change of the thoracic spine. IMPRESSION: 1. Limited noncontrast CT chest. 2. Multifocal pneumonia with LEFT upper lobe air-containing consolidation seen with atypical infection, abscess and septic  emboli. 3. Obstructed lingular bronchus with partial collapse. 4. Reactive LEFT > RIGHT axillary lymphadenopathy. Electronically Signed   By: Awilda Metro M.D.   On: 01/24/2018 05:32        Scheduled Meds: . buprenorphine-naloxone  1 tablet Sublingual BID  . Chlorhexidine Gluconate Cloth  6 each Topical Q0600  . ipratropium-albuterol  3 mL Nebulization Q6H WA  . mupirocin ointment  1 application Nasal BID  . nicotine  21 mg Transdermal Daily  . potassium chloride  40 mEq Oral Once   Continuous Infusions: . sodium chloride Stopped (01/24/18 1138)  . ceFEPime (MAXIPIME) IV 1 g (01/25/18 1004)  . metronidazole 500 mg (01/25/18 1154)  . vancomycin 1,000 mg (01/25/18 1335)     LOS: 1 day    Time spent:    Zannie Cove, MD [facility] Page via [email or web address], password [email or web address] After 7PM please contact night-coverage  01/25/2018, 2:00 PM

## 2018-01-25 NOTE — Plan of Care (Signed)
Monitor VS. Daily labs. Admin IV Abx as ordered.

## 2018-01-25 NOTE — Clinical Social Work Note (Signed)
Clinical Social Work Assessment  Patient Details  Name: Cheryl Hogan MRN: [id number] Date of Birth: 1985/11/23  Date of referral:  01/25/18               Reason for consult:  Substance Use/ETOH Abuse                Permission sought to share information with:  Facility Art therapist granted to share information::     Name::        Agency::  [facility]  Relationship::     Contact Information:     Housing/Transportation Living arrangements for the past 2 months:  Homeless Source of Information:  Patient Patient Interpreter Needed:  None Criminal Activity/Legal Involvement Pertinent to Current Situation/Hospitalization:  No - Comment as needed Significant Relationships:  Spouse Lives with:  Self Do you feel safe going back to the place where you live?  No Need for family participation in patient care:  No (Coment)  Care giving concerns: Patient is homeless. Also admitted positive for for cocaine, amphetamines, and marijuana.    Social Worker assessment / plan: CSW met with patient at bedside. Patient alert and oriented. CSW introduced self and role and discussed reason for consult. CSW assessed patient's substance use.  Patient reported heroin use. Patient stated she wants to stop using. Patient was not interested in [facility] clean needle exchange, stating that she just wants to stop using. CSW provided substance use treatment resource list. CSW and patient planned to follow up with [facility] in [place] to see if they can accept patient without insurance coverage. If so, patient agreeable to referral to [facility].   Patient reported she and her spouse are living in a tent. They have not stayed in a shelter before. She is familiar with the [facility]). CSW intern provided shelter list and information on [employer] of the [place].   CSW to follow up to support with attempting to find substance use treatment. Cannot guarantee placement in inpatient substance  use treatment directly from hospital, but can assist with initial referrals.   Employment status:  Unemployed Forensic scientist:  Self Pay (Medicaid Pending) PT Recommendations:  Not assessed at this time Information / Referral to community resources:  Residential Substance Abuse Treatment Options, Outpatient Substance Abuse Treatment Options, [facility] the [place]  Patient/Family's Response to care: Patient appreciative of resources.  Patient/Family's Understanding of and Emotional Response to Diagnosis, Current Treatment, and Prognosis: Patient with understanding of her condition and is motivated to change her substance use.  Emotional Assessment Appearance:  Appears stated age Attitude/Demeanor/Rapport:  Engaged, Lethargic Affect (typically observed):  Guarded Orientation:  Oriented to Self, Oriented to Place, Oriented to  Time, Oriented to Situation Alcohol / Substance use:  Illicit Drugs Psych involvement (Current and /or in the community):  No (Comment)  Discharge Needs  Concerns to be addressed:  Substance Abuse Concerns Readmission within the last 30 days:  No Current discharge risk:  Homeless, Substance Abuse Barriers to Discharge:  Continued Medical Work up   Estanislado Emms, LCSW 01/25/2018, 5:16 PM

## 2018-01-26 DIAGNOSIS — I330 Acute and subacute infective endocarditis: Secondary | ICD-10-CM

## 2018-01-26 DIAGNOSIS — I2690 Septic pulmonary embolism without acute cor pulmonale: Secondary | ICD-10-CM

## 2018-01-26 DIAGNOSIS — L02512 Cutaneous abscess of left hand: Secondary | ICD-10-CM

## 2018-01-26 DIAGNOSIS — A419 Sepsis, unspecified organism: Principal | ICD-10-CM

## 2018-01-26 DIAGNOSIS — F1190 Opioid use, unspecified, uncomplicated: Secondary | ICD-10-CM

## 2018-01-26 DIAGNOSIS — L02511 Cutaneous abscess of right hand: Secondary | ICD-10-CM

## 2018-01-26 DIAGNOSIS — F17200 Nicotine dependence, unspecified, uncomplicated: Secondary | ICD-10-CM

## 2018-01-26 LAB — BASIC METABOLIC PANEL
Anion gap: 7 (ref 5–15)
BUN: 9 mg/dL (ref 6–20)
CO2: 23 mmol/L (ref 22–32)
Calcium: 8.6 mg/dL — ABNORMAL LOW (ref 8.9–10.3)
Chloride: 109 mmol/L (ref 98–111)
Creatinine, Ser: 0.49 mg/dL (ref 0.44–1.00)
GFR calc Af Amer: >60 mL/min (ref >60)
GFR calc non Af Amer: >60 mL/min (ref >60)
Glucose, Bld: 118 mg/dL — ABNORMAL HIGH (ref 70–99)
Potassium: 3.6 mmol/L (ref 3.5–5.1)
Sodium: 139 mmol/L (ref 135–145)

## 2018-01-26 LAB — VANCOMYCIN, TROUGH: Vancomycin Tr: 9 ug/mL — ABNORMAL LOW (ref 15–20)

## 2018-01-26 LAB — CBC
HCT: 29.6 % — ABNORMAL LOW (ref 36.0–46.0)
Hemoglobin: 9.7 g/dL — ABNORMAL LOW (ref 12.0–15.0)
MCH: 27.3 pg (ref 26.0–34.0)
MCHC: 32.8 g/dL (ref 30.0–36.0)
MCV: 83.4 fL (ref 80.0–100.0)
PLATELETS: 663 10*3/uL — AB (ref 150–400)
RBC: 3.55 MIL/uL — ABNORMAL LOW (ref 3.87–5.11)
RDW: 15.7 % — ABNORMAL HIGH (ref 11.5–15.5)
WBC: 13 10*3/uL — ABNORMAL HIGH (ref 4.0–10.5)
nRBC: 0 % (ref 0.0–0.2)

## 2018-01-26 LAB — VANCOMYCIN, PEAK: Vancomycin Pk: 22 ug/mL — ABNORMAL LOW (ref 30–40)

## 2018-01-26 MED ORDER — SODIUM CHLORIDE 0.9 % IV SOLN
2.0000 g | INTRAVENOUS | Status: DC
  Administered 2018-01-26: 2 g via INTRAVENOUS
  Filled 2018-01-26 (×2): qty 20

## 2018-01-26 MED ORDER — VANCOMYCIN HCL 10 G IV SOLR
1250.0000 mg | Freq: Three times a day (TID) | INTRAVENOUS | Status: DC
  Administered 2018-01-26 – 2018-01-29 (×8): 1250 mg via INTRAVENOUS
  Filled 2018-01-26 (×9): qty 1250

## 2018-01-26 MED ORDER — IPRATROPIUM-ALBUTEROL 0.5-2.5 (3) MG/3ML IN SOLN
3.0000 mL | Freq: Four times a day (QID) | RESPIRATORY_TRACT | Status: DC | PRN
  Administered 2018-01-27: 3 mL via RESPIRATORY_TRACT
  Filled 2018-01-26: qty 3
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
## 2018-01-26 NOTE — Progress Notes (Signed)
Endocarditis Evaluation   Infective endocarditis (IE) is associated with a high rate of complications and mortality.  Specific aspects of clinical management are critical to optimizing the outcome of patients with IE. Therefore, the Pharmacy Residency Program at [facility] has initiated an evidence-based consult aimed at improving the management of IE at [facility].     Comments  Consult to ID Outpatient follow-up appointment to be made prior to discharge.  Utilization of ID recommended antibiotic therapy   Surgery Evaluation Cardiothoracic surgery consult if the following indications are present:  - Heart failure associated to valve dysfunction - Endocarditis complicated by heart block or aortic abscess - Left-sided endocarditis caused by S. aureus, fungal, or multi-drug resistant organisms - Left-sided endocarditis with severe valve dysfunction - Large vegetation (>1 cm) on aortic or mitral valve - Persistence of infection 5-7 days after initiation of appropriate antibiotic therapy - Recurrent emboli and persistent vegetations despite appropriate antibiotic therapy - Any history with left-sided embolization with persistent vegetations on TEE  Consult electrophysiologist if presence of implanted cardiac device (pacemaker, ICD)   Consult to cardiology if left ventricular ejection fraction is <40% Evaluation for appropriate heart failure medications upon discharge: - ACE/ARB - Diuretic - Beta blocker  Outpatient follow-up appointment to be made prior to discharge.   Based on the pharmacist's evaluation of this patient, there are no further recommendations to complete a thorough evaluation and care plan to reflect guideline-recommended therapy.  Danae Orleans, PharmD PGY1 Pharmacy Resident Phone [phone] 01/31/2018       9:59 AM

## 2018-01-26 NOTE — Social Work (Signed)
CSW met with patient and spouse, Liane Comber at bedside. Followed up regarding yesterday's discussion about substance use treatment resources.   Noted plan for TEE Monday, and concern for possible endocarditis - possible plan for long term antibiotics?  Patient is motivated to engage in substance abuse treatment. Discussed options for treatment that do not require insurance coverage - [facility], [facility], and [facility]. Patient was in the process of being set up with [facility] prior to hospital admission. Since patient not yet medically ready for discharge, will hold off on referrals for treatment.   Patient's spouse looking for permanent housing, as he and the patient have been homeless. Spouse has income from work to afford housing.   Will follow up with patient next week and remain available for support during admission.  Estanislado Emms, LCSW [phone]

## 2018-01-26 NOTE — Consult Note (Signed)
[facility]       Reason for Consult: infective endocarditis    Referring Physician: Dr. Jomarie LongsJoseph  Principal Problem:   Sepsis [facility](HCC) Active Problems:   Heroin abuse (HCC)   Abscess of right hand   Pulmonary abscess (HCC)   Cellulitis of left hand   IVDU (intravenous drug user)   . buprenorphine-naloxone  1 tablet Sublingual BID  . Chlorhexidine Gluconate Cloth  6 each Topical Q0600  . enoxaparin (LOVENOX) injection  40 mg Subcutaneous Q24H  . ipratropium-albuterol  3 mL Nebulization BID  . mupirocin ointment  1 application Nasal BID  . nicotine  21 mg Transdermal Daily    Recommendations: Continue vancomycin Will add ceftriaxone Stopping cefepime, metronidazole TEE Cardiology consult for valve evaluation and follow up   Assessment: She has hand abscesses, septic pulmonary emboli and TTE with three valve endocarditis.     Antibiotics: Vancomycin and cefepime  HPI: Cheryl Hogan is a 33 y.o. female with IVDU with heroin and 2 weeks of cough, chest pain and abscesses of both hands with TTE concerning for infective endocarditis.  Has septic emboli on CT of chest, no positive blood cultures yet but were fortunately sent prior to antibiotic administration.  GPC on gram stain of abscess cultures.  Husband at bedside.  Leukocytosis of 20 but afebrile.    Review of Systems:  Constitutional: negative for anorexia Cardiovascular: positive for dyspnea, negative for palpitations Gastrointestinal: negative for nausea and diarrhea Integument/breast: negative for rash All other systems reviewed and are negative    Past Medical History:  Diagnosis Date  . ADD (attention deficit disorder)   . Heroin abuse (HCC)   . IVDU (intravenous drug user)     Social History   Tobacco Use  . Smoking status: Current Every Day Smoker  . Smokeless tobacco: Never Used  Substance Use Topics  . Alcohol use: Not Currently  . Drug use: Yes    Types: IV, Heroin     FMH: + cardiac disease  No Known Allergies  Physical Exam: Constitutional: in no apparent distress, tearful with interview Vitals:   01/26/18 0012 01/26/18 0502  BP: (!) 125/53 (!) 150/86  Pulse: 85 79  Resp:    Temp: 98.4 F (36.9 C) 97.6 F (36.4 C)  SpO2: 98% 100%   EYES: anicteric ENMT: no thrush Cardiovascular: Cor RRR Respiratory: CTA B; normal respiratory effort GI: soft, nt Musculoskeletal: both arms wrapped Skin: rash Neuro: non-focal  Lab Results  Component Value Date   WBC 13.0 (H) 01/26/2018   HGB 9.7 (L) 01/26/2018   HCT 29.6 (L) 01/26/2018   MCV 83.4 01/26/2018   PLT 663 (H) 01/26/2018    Lab Results  Component Value Date   CREATININE 0.49 01/26/2018   BUN 9 01/26/2018   NA 139 01/26/2018   K 3.6 01/26/2018   CL 109 01/26/2018   CO2 23 01/26/2018   No results found for: ALT, AST, GGT, ALKPHOS   Microbiology: Recent Results (from the past 240 hour(s))  Blood Culture (routine x 2)     Status: None (Preliminary result)   Collection Time: 01/24/18  1:05 AM  Result Value Ref Range Status   Specimen Description BLOOD RIGHT ANTECUBITAL  Final   Special Requests   Final    BOTTLES DRAWN AEROBIC AND ANAEROBIC Blood Culture adequate volume   Culture   Final    NO GROWTH 1 DAY Performed at [facility], [address] [place], [place]  [address]    Report Status PENDING  Incomplete  Blood Culture (routine x 2)     Status: None (Preliminary result)   Collection Time: 01/24/18  1:26 AM  Result Value Ref Range Status   Specimen Description BLOOD LEFT ANTECUBITAL  Final   Special Requests   Final    BOTTLES DRAWN AEROBIC AND ANAEROBIC Blood Culture results may not be optimal due to an inadequate volume of blood received in culture bottles   Culture   Final    NO GROWTH 1 DAY Performed at [facility], [address], [place], [place] [address]    Report Status PENDING  Incomplete  Surgical pcr screen     Status: Abnormal    Collection Time: 01/24/18 12:15 PM  Result Value Ref Range Status   MRSA, PCR POSITIVE (A) NEGATIVE Final    Comment: RESULT CALLED TO, READ BACK BY AND VERIFIED WITH: Lavell Luster RN 16:05 01/24/18 ([email or web address])    Staphylococcus aureus POSITIVE (A) NEGATIVE Final    Comment: (NOTE) The Xpert SA Assay (FDA approved for NASAL specimens in patients 17 years of age and older), is one component of a comprehensive surveillance program. It is not intended to diagnose infection nor to guide or monitor treatment. Performed at [facility], [address], [place], [place] [address]   Aerobic/Anaerobic Culture (surgical/deep wound)     Status: None (Preliminary result)   Collection Time: 01/24/18  7:01 PM  Result Value Ref Range Status   Specimen Description WOUND LEFT HAND  Final   Special Requests SAMPLE A  Final   Gram Stain   Final    MODERATE WBC PRESENT,BOTH PMN AND MONONUCLEAR FEW GRAM POSITIVE COCCI    Culture   Final    TOO YOUNG TO READ Performed at [facility], [address], [place], [place] [address]    Report Status PENDING  Incomplete  Aerobic/Anaerobic Culture (surgical/deep wound)     Status: None (Preliminary result)   Collection Time: 01/24/18  7:02 PM  Result Value Ref Range Status   Specimen Description TISSUE RIGHT HAND  Final   Special Requests SAMPLE B  Final   Gram Stain   Final    ABUNDANT WBC PRESENT, PREDOMINANTLY PMN RARE GRAM POSITIVE COCCI    Culture   Final    TOO YOUNG TO READ Performed at [facility], [address] [place], [place] [address]    Report Status PENDING  Incomplete    Gardiner Barefoot, MD [facility] for Infectious [facility] [email or web address] 01/26/2018, 10:39 AM

## 2018-01-26 NOTE — Progress Notes (Signed)
Pharmacy Antibiotic Note  Cheryl SpangleRachel Cosey is a 33 y.o. female admitted on 01/23/2018 with hand abscess found to have TEE concerning for endocarditis. CT noted septic emboli of the chest, blood cultures negative thus far. Abscess culture growing S. aureus.  Pharmacy has been consulted for vancomycin dosing. Pt initially on cefepime and metronidazole as well which has now been consolidated to ceftriaxone per ID today.   Vancomycin levels today demonstrated peak of 22 mcg/ml and trough of 9 mcg/ml, correlating with a subtherapeutic AUC of 359 mcg*h/ml. Cr has remained stable, WBC trending down.  Plan: -Increase vancomycin to 1250mg  IV q8h -Continue ceftriaxone 2g IV q24h -Continue to monitor cultures, LOT, renal function -Repeat vancomycin levels at new Css Sunday or Monday  Height: 5\' 9"  (175.3 cm) Weight: 151 lb 3.2 oz (68.6 kg) IBW/kg (Calculated) : 66.2  Temp (24hrs), Avg:98.1 F (36.7 C), Min:97.6 F (36.4 C), Max:98.4 F (36.9 C)  Recent Labs  Lab 01/23/18 2041 01/24/18 0042 01/25/18 0430 01/26/18 0450 01/26/18 1613  WBC 20.6*  --  23.0* 13.0*  --   CREATININE 0.50  --  0.34* 0.49  --   LATICACIDVEN  --  0.9  --   --   --   VANCOPEAK  --   --   --   --  22*    Estimated Creatinine Clearance: 107.5 mL/min (by C-G formula based on SCr of 0.49 mg/dL).    No Known Allergies  Antimicrobials this admission: Vancomycin 1/22 >> Ceftriaxone 1/24 >> Cefepime 1/22 >> 1/24 Metronidazole 1/22 >> 1/23 Cefazoline 1/22 x1  Dose adjustments this admission: 1/24: Vp 22; Vt 9; AUC 359 >> increase vancomycin to 1250mg  IV q8h  Microbiology results: 1/22 BCx: NGTD x48h 1/22 MRSA PCR: positive 1/22 Wound Cx (L hand): few S. Aureus 1/22 Tissue Cx (R hand): few S. aureus  Thank you for allowing pharmacy to be a part of this patient's care.  Fredonia HighlandMichael Juliauna Stueve, PharmD, BCPS Clinical Pharmacist [phone] Please check AMION for all [facility] Pharmacy numbers 01/26/2018

## 2018-01-26 NOTE — Progress Notes (Signed)
PROGRESS NOTE    Cheryl Hogan  QHU:[id number] DOB: May 08, 1985 DOA: 01/23/2018 PCP: Patient, [name]  Brief Narrative: 33 year old female with history of active IV heroin abuse presented to the ED with swelling abscess overlying right hand dorsal surface and left hand involving index finger. -Chest x-ray was also concerning for septic pulmonary emboli -Hand Surgery consulted  Assessment & Plan:   Sepsis, right hand dorsal abscess and left hand index finger tenosynovitis -Orthopedics consulting, status post I&D of right hand dorsal abscess and tenosynovectomy of left hand index finger 1/22 -Continue IV vancomycin, wound cultures with gram-positive cocci on Gram stain  -Started ceftriaxone, cefepime and metronidazole discontinued  -Blood cultures negative thus far -Hand surgery to follow-up for wound check today  Endocarditis -2D echocardiogram concerning for vegetations on the aortic, mitral and tricuspid valves -Blood cultures negative thus far -Antibiotics as above -ID Dr. Luciana Axe consulting -Cardiology consulted for TEE and further evaluation they will see her following TEE on Monday  Septic emboli/obstructed lingular abscess -Continue above antibiotics -Infective endocarditis is the etiology of this  Positive beta hCG -Repeat is negative  Heroin abuse -Continue buprenorphine for opiate withdrawal treatment protocol  Tobacco abuse -Nicotine patch  DVT prophylaxis: Add Lovenox Code Status: Full code Family Communication: Spouse at bedside for day Disposition Plan: Home pending above work-up and treatment  Consultants:   Hand surgery Dr. Melvyn Novas  Infectious disease Dr. Luciana Axe  Procedures: OPERATIVE PROCEDURE: #1: Left hand extensor tendon tenosynovectomy EIP and EDC to the index finger aggressive tenosynovectomy #2: Left hand incision and drainage of complex abscess dorsal finger and hand  #3: Right hand drainage of subcutaneous abscess dorsal right  hand  Antimicrobials:    Subjective: - still feels pretty miserable but improving, complains of pain in both hands Objective: Vitals:   01/25/18 1929 01/25/18 2029 01/26/18 0012 01/26/18 0502  BP: (!) 121/57  (!) 125/53 (!) 150/86  Pulse: 89  85 79  Resp:      Temp: 99.3 F (37.4 C)  98.4 F (36.9 C) 97.6 F (36.4 C)  TempSrc: Oral  Oral Oral  SpO2: 99% 99% 98% 100%  Weight:    68.6 kg  Height:        Intake/Output Summary (Last 24 hours) at 01/26/2018 1354 Last data filed at 01/26/2018 0400 Gross per 24 hour  Intake 2767.73 ml  Output -  Net 2767.73 ml   Filed Weights   01/24/18 1200 01/25/18 0328 01/26/18 0502  Weight: 65.7 kg 67 kg 68.6 kg    Examination:  Gen: Awake, Alert, Oriented X 3, less anxious today but tearful HEENT: PERRLA, Neck supple, no JVD Lungs: Few scattered rhonchi CVS: S1-S2/regular rate rhythm, faint systolic and diastolic murmur noted Abd: soft, Non tender, non distended, BS present Extremities: No edema Skin: no new rashes Psychiatry: Mood & affect appropriate.     Data Reviewed:   CBC: Recent Labs  Lab 01/23/18 2041 01/25/18 0430 01/26/18 0450  WBC 20.6* 23.0* 13.0*  HGB 9.9* 9.4* 9.7*  HCT 31.0* 29.0* 29.6*  MCV 84.0 82.4 83.4  PLT 666* 648* 663*   Basic Metabolic Panel: Recent Labs  Lab 01/23/18 2041 01/25/18 0430 01/26/18 0450  NA 134* 139 139  K 3.0* 3.0* 3.6  CL 99 106 109  CO2 27 24 23   GLUCOSE 126* 110* 118*  BUN 8 <5* 9  CREATININE 0.50 0.34* 0.49  CALCIUM 8.4* 7.9* 8.6*   GFR: Estimated Creatinine Clearance: 107.5 mL/min (by C-G formula based on SCr of  0.49 mg/dL). Liver Function Tests: No results for input(s): AST, ALT, ALKPHOS, BILITOT, PROT, ALBUMIN in the last 168 hours. No results for input(s): LIPASE, AMYLASE in the last 168 hours. No results for input(s): AMMONIA in the last 168 hours. Coagulation Profile: No results for input(s): INR, PROTIME in the last 168 hours. Cardiac Enzymes: No results  for input(s): CKTOTAL, CKMB, CKMBINDEX, TROPONINI in the last 168 hours. BNP (last 3 results) No results for input(s): PROBNP in the last 8760 hours. HbA1C: No results for input(s): HGBA1C in the last 72 hours. CBG: No results for input(s): GLUCAP in the last 168 hours. Lipid Profile: No results for input(s): CHOL, HDL, LDLCALC, TRIG, CHOLHDL, LDLDIRECT in the last 72 hours. Thyroid Function Tests: No results for input(s): TSH, T4TOTAL, FREET4, T3FREE, THYROIDAB in the last 72 hours. Anemia Panel: No results for input(s): VITAMINB12, FOLATE, FERRITIN, TIBC, IRON, RETICCTPCT in the last 72 hours. Urine analysis:    Component Value Date/Time   COLORURINE YELLOW 01/23/2018 2335   APPEARANCEUR CLEAR 01/23/2018 2335   LABSPEC 1.004 (L) 01/23/2018 2335   PHURINE 7.0 01/23/2018 2335   GLUCOSEU NEGATIVE 01/23/2018 2335   HGBUR MODERATE (A) 01/23/2018 2335   BILIRUBINUR NEGATIVE 01/23/2018 2335   KETONESUR NEGATIVE 01/23/2018 2335   PROTEINUR NEGATIVE 01/23/2018 2335   NITRITE NEGATIVE 01/23/2018 2335   LEUKOCYTESUR NEGATIVE 01/23/2018 2335   Sepsis Labs: @LABRCNTIP (procalcitonin:4,lacticidven:4)  ) Recent Results (from the past 240 hour(s))  Blood Culture (routine x 2)     Status: None (Preliminary result)   Collection Time: 01/24/18  1:05 AM  Result Value Ref Range Status   Specimen Description BLOOD RIGHT ANTECUBITAL  Final   Special Requests   Final    BOTTLES DRAWN AEROBIC AND ANAEROBIC Blood Culture adequate volume   Culture   Final    NO GROWTH 1 DAY Performed at [facility], [address], [place], [place] [address]    Report Status PENDING  Incomplete  Blood Culture (routine x 2)     Status: None (Preliminary result)   Collection Time: 01/24/18  1:26 AM  Result Value Ref Range Status   Specimen Description BLOOD LEFT ANTECUBITAL  Final   Special Requests   Final    BOTTLES DRAWN AEROBIC AND ANAEROBIC Blood Culture results may not be optimal due to an  inadequate volume of blood received in culture bottles   Culture   Final    NO GROWTH 1 DAY Performed at [facility], [address], [place], [place] [address]    Report Status PENDING  Incomplete  Surgical pcr screen     Status: Abnormal   Collection Time: 01/24/18 12:15 PM  Result Value Ref Range Status   MRSA, PCR POSITIVE (A) NEGATIVE Final    Comment: RESULT CALLED TO, READ BACK BY AND VERIFIED WITH: Lavell Luster RN 16:05 01/24/18 ([email or web address])    Staphylococcus aureus POSITIVE (A) NEGATIVE Final    Comment: (NOTE) The Xpert SA Assay (FDA approved for NASAL specimens in patients 57 years of age and older), is one component of a comprehensive surveillance program. It is not intended to diagnose infection nor to guide or monitor treatment. Performed at [facility], [address] [place], [place] [address]   Aerobic/Anaerobic Culture (surgical/deep wound)     Status: None (Preliminary result)   Collection Time: 01/24/18  7:01 PM  Result Value Ref Range Status   Specimen Description WOUND LEFT HAND  Final   Special Requests SAMPLE A  Final   Gram Stain   Final    MODERATE WBC PRESENT,BOTH PMN AND MONONUCLEAR FEW GRAM POSITIVE COCCI    Culture   Final    CULTURE REINCUBATED FOR BETTER GROWTH Performed at [facility], [address] [place], [place] [address]    Report Status PENDING  Incomplete  Aerobic/Anaerobic Culture (surgical/deep wound)     Status: None (Preliminary result)   Collection Time: 01/24/18  7:02 PM  Result Value Ref Range Status   Specimen Description TISSUE RIGHT HAND  Final   Special Requests SAMPLE B  Final   Gram Stain   Final    ABUNDANT WBC PRESENT, PREDOMINANTLY PMN RARE GRAM POSITIVE COCCI    Culture   Final    CULTURE REINCUBATED FOR BETTER GROWTH Performed at [facility], [address] [place], [place] [address]    Report Status PENDING  Incomplete         Radiology Studies: No results  found.      Scheduled Meds: . buprenorphine-naloxone  1 tablet Sublingual BID  . Chlorhexidine Gluconate Cloth  6 each Topical Q0600  . enoxaparin (LOVENOX) injection  40 mg Subcutaneous Q24H  . ipratropium-albuterol  3 mL Nebulization BID  . mupirocin ointment  1 application Nasal BID  . nicotine  21 mg Transdermal Daily   Continuous Infusions: . sodium chloride 75 mL/hr at 01/25/18 1433  . cefTRIAXone (ROCEPHIN)  IV    . vancomycin 1,000 mg (01/26/18 0448)     LOS: 2 days    Time spent: 25min    Zannie CovePreetha Bryceton Hantz, MD [facility] Page via [email or web address], password [email or web address] After 7PM please contact night-coverage  01/26/2018, 1:54 PM

## 2018-01-26 NOTE — H&P (View-Only) (Signed)
CARDIOLOGY CONSULT NOTE  Patient ID: Cheryl SpangleRachel Ruppert MRN: [id number] DOB/AGE: 1985/10/15 33 y.o.  Admit date: 01/23/2018 Primary Physician None Primary Cardiologist None Chief Complaint  Hand pain Requesting  Dr. Joya MartyrPreetha  HPI:   The patient has a history of IV drug abuse.   She lives in a tent with her spouse. She presented on 1/22 with pain in her had.  She was found to have abscesses in her hands and is now status post tenosynovectomy of the index finger on the left hand.  She also had drainage of the subcutaneous abscess on the dorsum of her right hand.  Blood cultures are negative thus far.  She is being followed by ID.  She is being managed with antibiotics.  Echocardiogram demonstrates an EF of 50 to 55%.  There is possibly a small vegetation on the aortic valve, mitral valve and a medium size defect on the tricuspid valve.  She does have evidence of emboli with pulmonary abscess of the left upper lobe.  She does report that she was having fevers recently.  The patient denies any new symptoms such as chest discomfort, neck or arm discomfort. There has been no new shortness of breath, PND or orthopnea. There have been no reported palpitations, presyncope or syncope.  She just started injecting drugs in her hands.  She has had no prior cardiac history or problems.  No prior cardiac testing.      Past Medical History:  Diagnosis Date  . ADD (attention deficit disorder)   . Heroin abuse (HCC)   . IVDU (intravenous drug user)     Past Surgical History:  Procedure Laterality Date  . I&D EXTREMITY Bilateral 01/24/2018   Procedure: IRRIGATION AND DEBRIDEMENT OF HAND;  Surgeon: Bradly Bienenstockrtmann, Fred, MD;  [facility];  Service: Orthopedics;  Laterality: Bilateral;    No Known Allergies No medications prior to admission.   History reviewed. No pertinent family history.  Social History   Socioeconomic History  . Marital status: Married    Spouse name: Not on file  . Number of children:  Not on file  . Years of education: Not on file  . Highest education level: Not on file  Occupational History  . Not on file  Social Needs  . Financial resource strain: Not on file  . Food insecurity:    Worry: Not on file    Inability: Not on file  . Transportation needs:    Medical: Not on file    Non-medical: Not on file  Tobacco Use  . Smoking status: Current Every Day Smoker  . Smokeless tobacco: Never Used  Substance and Sexual Activity  . Alcohol use: Not Currently  . Drug use: Yes    Types: IV, Heroin  . Sexual activity: Not on file  Lifestyle  . Physical activity:    Days per week: Not on file    Minutes per session: Not on file  . Stress: Not on file  Relationships  . Social connections:    Talks on phone: Not on file    Gets together: Not on file    Attends religious service: Not on file    Active member of club or organization: Not on file    Attends meetings of clubs or organizations: Not on file    Relationship status: Not on file  . Intimate partner violence:    Fear of current or ex partner: Not on file    Emotionally abused: Not on file    Physically  abused: Not on file    Forced sexual activity: Not on file  Other Topics Concern  . Not on file  Social History Narrative  . Not on file     ROS:    As stated in the HPI and negative for all other systems.  Physical Exam: Blood pressure 128/90, pulse 81, temperature 98 F (36.7 C), temperature source Oral, resp. rate 12, height 5' 9" (1.753 m), weight 68.6 kg, SpO2 100 %.  GENERAL:  Well appearing HEENT:  Pupils equal round and reactive, fundi not visualized, oral mucosa unremarkable NECK:  No jugular venous distention, waveform within normal limits, carotid upstroke brisk and symmetric, no bruits, no thyromegaly LYMPHATICS:  No cervical, inguinal adenopathy LUNGS:  Clear to auscultation bilaterally BACK:  No CVA tenderness CHEST:  Unremarkable HEART:  PMI not displaced or sustained,S1 and S2  within normal limits, no S3, no S4, no clicks, no rubs, no murmurs ABD:  Flat, positive bowel sounds normal in frequency in pitch, no bruits, no rebound, no guarding, no midline pulsatile mass, no hepatomegaly, no splenomegaly EXT:  2 plus pulses feet.  Both hands are wrapped SKIN:  No rashes no nodules NEURO:  Cranial nerves II through XII grossly intact, motor grossly intact throughout PSYCH:  Cognitively intact, oriented to person place and time   Labs: Lab Results  Component Value Date   BUN 9 01/26/2018   Lab Results  Component Value Date   CREATININE 0.49 01/26/2018   Lab Results  Component Value Date   NA 139 01/26/2018   K 3.6 01/26/2018   CL 109 01/26/2018   CO2 23 01/26/2018   No results found for: TROPONINI Lab Results  Component Value Date   WBC 13.0 (H) 01/26/2018   HGB 9.7 (L) 01/26/2018   HCT 29.6 (L) 01/26/2018   MCV 83.4 01/26/2018   PLT 663 (H) 01/26/2018   No results found for: CHOL, HDL, LDLCALC, LDLDIRECT, TRIG, CHOLHDL No results found for: ALT, AST, GGT, ALKPHOS, BILITOT    Radiology:   CXR: Pulmonary consolidations in the lingula and left upper lobe. Findings raise concern for multilobar pneumonia with cavitary component in the left upper lobe. Differential considerations would include pulmonary abscess, less likely septic emboli or cavitary mass.  EKG:   Sinus tach, rate 107, RAD, no acute ST T wave changes.  Intervals WNL.    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  She is at high risk for endocarditis.  She does not have positive blood cultures however.  This is possible endocarditis based on current criteria.  (One major criteria - vegetations, one minor IVDU)  I do not see physical findings consistent with immunologic events no RF drawn, no cultures positive or pathology.   She does not have any high risk findings at this point for urgent surgical evaluation.  She should have continued appropriate  antibiotic therapy and I would assume endocarditis.  She will have a TEE to look for high risk findings.  She does have evidence of embolic events but this was prior to antibiotic treatment.  She is on the schedule for a procedure at noon on Monday.     Signed: Mckinleigh Schuchart 01/26/2018, 3:01 PM     

## 2018-01-26 NOTE — Consult Note (Signed)
CARDIOLOGY CONSULT NOTE  Patient ID: Cheryl Hogan MRN: [id number] DOB/AGE: 1985/10/15 33 y.o.  Admit date: 01/23/2018 Primary Physician None Primary Cardiologist None Chief Complaint  Hand pain Requesting  Dr. Joya MartyrPreetha  HPI:   The patient has a history of IV drug abuse.   She lives in a tent with her spouse. She presented on 1/22 with pain in her had.  She was found to have abscesses in her hands and is now status post tenosynovectomy of the index finger on the left hand.  She also had drainage of the subcutaneous abscess on the dorsum of her right hand.  Blood cultures are negative thus far.  She is being followed by ID.  She is being managed with antibiotics.  Echocardiogram demonstrates an EF of 50 to 55%.  There is possibly a small vegetation on the aortic valve, mitral valve and a medium size defect on the tricuspid valve.  She does have evidence of emboli with pulmonary abscess of the left upper lobe.  She does report that she was having fevers recently.  The patient denies any new symptoms such as chest discomfort, neck or arm discomfort. There has been no new shortness of breath, PND or orthopnea. There have been no reported palpitations, presyncope or syncope.  She just started injecting drugs in her hands.  She has had no prior cardiac history or problems.  No prior cardiac testing.      Past Medical History:  Diagnosis Date  . ADD (attention deficit disorder)   . Heroin abuse (HCC)   . IVDU (intravenous drug user)     Past Surgical History:  Procedure Laterality Date  . I&D EXTREMITY Bilateral 01/24/2018   Procedure: IRRIGATION AND DEBRIDEMENT OF HAND;  Surgeon: Bradly Bienenstockrtmann, Fred, MD;  [facility];  Service: Orthopedics;  Laterality: Bilateral;    No Known Allergies No medications prior to admission.   History reviewed. No pertinent family history.  Social History   Socioeconomic History  . Marital status: Married    Spouse name: Not on file  . Number of children:  Not on file  . Years of education: Not on file  . Highest education level: Not on file  Occupational History  . Not on file  Social Needs  . Financial resource strain: Not on file  . Food insecurity:    Worry: Not on file    Inability: Not on file  . Transportation needs:    Medical: Not on file    Non-medical: Not on file  Tobacco Use  . Smoking status: Current Every Day Smoker  . Smokeless tobacco: Never Used  Substance and Sexual Activity  . Alcohol use: Not Currently  . Drug use: Yes    Types: IV, Heroin  . Sexual activity: Not on file  Lifestyle  . Physical activity:    Days per week: Not on file    Minutes per session: Not on file  . Stress: Not on file  Relationships  . Social connections:    Talks on phone: Not on file    Gets together: Not on file    Attends religious service: Not on file    Active member of club or organization: Not on file    Attends meetings of clubs or organizations: Not on file    Relationship status: Not on file  . Intimate partner violence:    Fear of current or ex partner: Not on file    Emotionally abused: Not on file    Physically  abused: Not on file    Forced sexual activity: Not on file  Other Topics Concern  . Not on file  Social History Narrative  . Not on file     ROS:    As stated in the HPI and negative for all other systems.  Physical Exam: Blood pressure 128/90, pulse 81, temperature 98 F (36.7 C), temperature source Oral, resp. rate 12, height 5\' 9"  (1.753 m), weight 68.6 kg, SpO2 100 %.  GENERAL:  Well appearing HEENT:  Pupils equal round and reactive, fundi not visualized, oral mucosa unremarkable NECK:  No jugular venous distention, waveform within normal limits, carotid upstroke brisk and symmetric, no bruits, no thyromegaly LYMPHATICS:  No cervical, inguinal adenopathy LUNGS:  Clear to auscultation bilaterally BACK:  No CVA tenderness CHEST:  Unremarkable HEART:  PMI not displaced or sustained,S1 and S2  within normal limits, no S3, no S4, no clicks, no rubs, no murmurs ABD:  Flat, positive bowel sounds normal in frequency in pitch, no bruits, no rebound, no guarding, no midline pulsatile mass, no hepatomegaly, no splenomegaly EXT:  2 plus pulses feet.  Both hands are wrapped SKIN:  No rashes no nodules NEURO:  Cranial nerves II through XII grossly intact, motor grossly intact throughout PSYCH:  Cognitively intact, oriented to person place and time   Labs: Lab Results  Component Value Date   BUN 9 01/26/2018   Lab Results  Component Value Date   CREATININE 0.49 01/26/2018   Lab Results  Component Value Date   NA 139 01/26/2018   K 3.6 01/26/2018   CL 109 01/26/2018   CO2 23 01/26/2018   No results found for: TROPONINI Lab Results  Component Value Date   WBC 13.0 (H) 01/26/2018   HGB 9.7 (L) 01/26/2018   HCT 29.6 (L) 01/26/2018   MCV 83.4 01/26/2018   PLT 663 (H) 01/26/2018   No results found for: CHOL, HDL, LDLCALC, LDLDIRECT, TRIG, CHOLHDL No results found for: ALT, AST, GGT, ALKPHOS, BILITOT    Radiology:   CXR: Pulmonary consolidations in the lingula and left upper lobe. Findings raise concern for multilobar pneumonia with cavitary component in the left upper lobe. Differential considerations would include pulmonary abscess, less likely septic emboli or cavitary mass.  EKG:   Sinus tach, rate 107, RAD, no acute ST T wave changes.  Intervals WNL.    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  She is at high risk for endocarditis.  She does not have positive blood cultures however.  This is possible endocarditis based on current criteria.  (One major criteria - vegetations, one minor IVDU)  I do not see physical findings consistent with immunologic events no RF drawn, no cultures positive or pathology.   She does not have any high risk findings at this point for urgent surgical evaluation.  She should have continued appropriate  antibiotic therapy and I would assume endocarditis.  She will have a TEE to look for high risk findings.  She does have evidence of embolic events but this was prior to antibiotic treatment.  She is on the schedule for a procedure at noon on Monday.     SignedRollene Rotunda: Kailah Pennel 01/26/2018, 3:01 PM

## 2018-01-27 DIAGNOSIS — R0989 Other specified symptoms and signs involving the circulatory and respiratory systems: Secondary | ICD-10-CM

## 2018-01-27 DIAGNOSIS — I339 Acute and subacute endocarditis, unspecified: Secondary | ICD-10-CM

## 2018-01-27 NOTE — Plan of Care (Signed)
  Problem: Education: Goal: Knowledge of General Education information will improve Description Including pain rating scale, medication(s)/side effects and non-pharmacologic comfort measures Outcome: Progressing   Problem: Clinical Measurements: Goal: Respiratory complications will improve Outcome: Progressing   Problem: Activity: Goal: Risk for activity intolerance will decrease Outcome: Progressing   Problem: Nutrition: Goal: Adequate nutrition will be maintained Outcome: Completed/Met

## 2018-01-27 NOTE — Progress Notes (Signed)
PROGRESS NOTE    Cheryl Hogan  NWG:[id number] DOB: [birth year] DOA: 01/23/2018 PCP: Patient, [name]  Brief Narrative: 33 year old female with history of active IV heroin abuse presented to the ED with swelling abscess overlying right hand dorsal surface and left hand involving index finger. -Chest x-ray was also concerning for septic pulmonary emboli -Hand Surgery consulted  Assessment & Plan:   Sepsis, right hand dorsal abscess and left hand index finger tenosynovitis -Orthopedics consulting, status post I&D of right hand dorsal abscess and tenosynovectomy of left hand index finger 1/22 -Continue IV vancomycin, ceftriaxone, wound culture with MRSA  -Blood cultures are negative -Hand surgery to follow-up  Multi valvular endocarditis -2D echocardiogram concerning for vegetations on the aortic, mitral and tricuspid valves -Blood cultures negative thus far -Continue IV vancomycin/ceftriaxone as above -ID Dr. Luciana Axe consulting -Cardiology consulted, plan for TEE on Monday  Septic emboli/obstructed lingular abscess -Continue above antibiotics -Infective endocarditis is the etiology of this  Positive beta hCG -Repeat is negative  Heroin abuse -Continue buprenorphine for opiate withdrawal treatment protocol  Tobacco abuse -Nicotine patch  DVT prophylaxis:  Lovenox Code Status: Full code Family Communication: No family at bedside Disposition Plan: Home pending above work-up and treatment  Consultants:   Hand surgery Dr. Melvyn Novas  Infectious disease Dr. Luciana Axe  Cardiology  Procedures: OPERATIVE PROCEDURE: #1: Left hand extensor tendon tenosynovectomy EIP and EDC to the index finger aggressive tenosynovectomy #2: Left hand incision and drainage of complex abscess dorsal finger and hand  #3: Right hand drainage of subcutaneous abscess dorsal right hand  Antimicrobials:    Subjective:  -Is okay this morning, Suboxone is helping, cough and congestion is  improving -Complains of pain in both hands Objective: Vitals:   01/26/18 1426 01/26/18 1914 01/26/18 2032 01/27/18 0655  BP: 128/90  117/75 123/79  Pulse: 81  87   Resp:   19 20  Temp: 98 F (36.7 C)  98.3 F (36.8 C) 98.2 F (36.8 C)  TempSrc: Oral  Oral Oral  SpO2: 100% 98% 99% 99%  Weight:      Height:        Intake/Output Summary (Last 24 hours) at 01/27/2018 1232 Last data filed at 01/27/2018 0700 Gross per 24 hour  Intake 1999.05 ml  Output -  Net 1999.05 ml   Filed Weights   01/24/18 1200 01/25/18 0328 01/26/18 0502  Weight: 65.7 kg 67 kg 68.6 kg    Examination:  Gen: Awake, Alert, Oriented X 3, no distress HEENT: PERRLA, Neck supple, no JVD Lungs: Clear anteriorly CVS: S1-S2/regular rate rhythm, faint systolic and diastolic murmur suspected Abd: soft, Non tender, non distended, BS present Extremities: No edema, both forearms/hands wrapped Skin: As above  psychiatry: Mood & affect appropriate.     Data Reviewed:   CBC: Recent Labs  Lab 01/23/18 2041 01/25/18 0430 01/26/18 0450  WBC 20.6* 23.0* 13.0*  HGB 9.9* 9.4* 9.7*  HCT 31.0* 29.0* 29.6*  MCV 84.0 82.4 83.4  PLT 666* 648* 663*   Basic Metabolic Panel: Recent Labs  Lab 01/23/18 2041 01/25/18 0430 01/26/18 0450  NA 134* 139 139  K 3.0* 3.0* 3.6  CL 99 106 109  CO2 27 24 23   GLUCOSE 126* 110* 118*  BUN 8 <5* 9  CREATININE 0.50 0.34* 0.49  CALCIUM 8.4* 7.9* 8.6*   GFR: Estimated Creatinine Clearance: 107.5 mL/min (by C-G formula based on SCr of 0.49 mg/dL). Liver Function Tests: No results for input(s): AST, ALT, ALKPHOS, BILITOT, PROT, ALBUMIN in the  last 168 hours. No results for input(s): LIPASE, AMYLASE in the last 168 hours. No results for input(s): AMMONIA in the last 168 hours. Coagulation Profile: No results for input(s): INR, PROTIME in the last 168 hours. Cardiac Enzymes: No results for input(s): CKTOTAL, CKMB, CKMBINDEX, TROPONINI in the last 168 hours. BNP (last 3  results) No results for input(s): PROBNP in the last 8760 hours. HbA1C: No results for input(s): HGBA1C in the last 72 hours. CBG: No results for input(s): GLUCAP in the last 168 hours. Lipid Profile: No results for input(s): CHOL, HDL, LDLCALC, TRIG, CHOLHDL, LDLDIRECT in the last 72 hours. Thyroid Function Tests: No results for input(s): TSH, T4TOTAL, FREET4, T3FREE, THYROIDAB in the last 72 hours. Anemia Panel: No results for input(s): VITAMINB12, FOLATE, FERRITIN, TIBC, IRON, RETICCTPCT in the last 72 hours. Urine analysis:    Component Value Date/Time   COLORURINE YELLOW 01/23/2018 2335   APPEARANCEUR CLEAR 01/23/2018 2335   LABSPEC 1.004 (L) 01/23/2018 2335   PHURINE 7.0 01/23/2018 2335   GLUCOSEU NEGATIVE 01/23/2018 2335   HGBUR MODERATE (A) 01/23/2018 2335   BILIRUBINUR NEGATIVE 01/23/2018 2335   KETONESUR NEGATIVE 01/23/2018 2335   PROTEINUR NEGATIVE 01/23/2018 2335   NITRITE NEGATIVE 01/23/2018 2335   LEUKOCYTESUR NEGATIVE 01/23/2018 2335   Sepsis Labs: @LABRCNTIP (procalcitonin:4,lacticidven:4)  ) Recent Results (from the past 240 hour(s))  Blood Culture (routine x 2)     Status: None (Preliminary result)   Collection Time: 01/24/18  1:05 AM  Result Value Ref Range Status   Specimen Description BLOOD RIGHT ANTECUBITAL  Final   Special Requests   Final    BOTTLES DRAWN AEROBIC AND ANAEROBIC Blood Culture adequate volume   Culture   Final    NO GROWTH 3 DAYS Performed at [facility], [address], [place], [place] [address]    Report Status PENDING  Incomplete  Blood Culture (routine x 2)     Status: None (Preliminary result)   Collection Time: 01/24/18  1:26 AM  Result Value Ref Range Status   Specimen Description BLOOD LEFT ANTECUBITAL  Final   Special Requests   Final    BOTTLES DRAWN AEROBIC AND ANAEROBIC Blood Culture results may not be optimal due to an inadequate volume of blood received in culture bottles   Culture   Final    NO GROWTH 3  DAYS Performed at [facility], [address] [place], [place] [address]    Report Status PENDING  Incomplete  Surgical pcr screen     Status: Abnormal   Collection Time: 01/24/18 12:15 PM  Result Value Ref Range Status   MRSA, PCR POSITIVE (A) NEGATIVE Final    Comment: RESULT CALLED TO, READ BACK BY AND VERIFIED WITH: Lavell Luster RN 16:05 01/24/18 ([email or web address])    Staphylococcus aureus POSITIVE (A) NEGATIVE Final    Comment: (NOTE) The Xpert SA Assay (FDA approved for NASAL specimens in patients 58 years of age and older), is one component of a comprehensive surveillance program. It is not intended to diagnose infection nor to guide or monitor treatment. Performed at [facility], [address], [place], [place] [address]   Aerobic/Anaerobic Culture (surgical/deep wound)     Status: None (Preliminary result)   Collection Time: 01/24/18  7:01 PM  Result Value Ref Range Status   Specimen Description WOUND LEFT HAND  Final   Special Requests SAMPLE A  Final   Gram Stain   Final    MODERATE WBC PRESENT,BOTH PMN AND  MONONUCLEAR FEW GRAM POSITIVE COCCI Performed at [facility], [address] [place], [place] [address]    Culture   Final    FEW METHICILLIN RESISTANT STAPHYLOCOCCUS AUREUS NO ANAEROBES ISOLATED; CULTURE IN PROGRESS FOR 5 DAYS    Report Status PENDING  Incomplete   Organism ID, Bacteria METHICILLIN RESISTANT STAPHYLOCOCCUS AUREUS  Final      Susceptibility   Methicillin resistant staphylococcus aureus - MIC*    CIPROFLOXACIN <=0.5 SENSITIVE Sensitive     ERYTHROMYCIN >=8 RESISTANT Resistant     GENTAMICIN <=0.5 SENSITIVE Sensitive     OXACILLIN >=4 RESISTANT Resistant     TETRACYCLINE <=1 SENSITIVE Sensitive     VANCOMYCIN <=0.5 SENSITIVE Sensitive     TRIMETH/SULFA <=10 SENSITIVE Sensitive     CLINDAMYCIN <=0.25 SENSITIVE Sensitive     RIFAMPIN <=0.5 SENSITIVE Sensitive     Inducible Clindamycin NEGATIVE Sensitive     * FEW METHICILLIN  RESISTANT STAPHYLOCOCCUS AUREUS  Aerobic/Anaerobic Culture (surgical/deep wound)     Status: None (Preliminary result)   Collection Time: 01/24/18  7:02 PM  Result Value Ref Range Status   Specimen Description TISSUE RIGHT HAND  Final   Special Requests SAMPLE B  Final   Gram Stain   Final    ABUNDANT WBC PRESENT, PREDOMINANTLY PMN RARE GRAM POSITIVE COCCI    Culture   Final    FEW STREPTOCOCCUS ANGINOSIS RARE STAPHYLOCOCCUS AUREUS SUSCEPTIBILITIES TO FOLLOW NO ANAEROBES ISOLATED; CULTURE IN PROGRESS FOR 5 DAYS CULTURE REINCUBATED FOR BETTER GROWTH Performed at [facility], [address] [place], [place] [address]    Report Status PENDING  Incomplete         Radiology Studies: No results found.      Scheduled Meds: . buprenorphine-naloxone  1 tablet Sublingual BID  . Chlorhexidine Gluconate Cloth  6 each Topical Q0600  . enoxaparin (LOVENOX) injection  40 mg Subcutaneous Q24H  . mupirocin ointment  1 application Nasal BID  . nicotine  21 mg Transdermal Daily   Continuous Infusions: . sodium chloride Stopped (01/27/18 0653)  . cefTRIAXone (ROCEPHIN)  IV Stopped (01/26/18 1735)  . vancomycin Stopped (01/27/18 [id number])     LOS: 3 days    Time spent: 25min    Zannie CovePreetha Cherry [place], MD [facility]  01/27/2018, 12:32 PM

## 2018-01-28 DIAGNOSIS — F1123 Opioid dependence with withdrawal: Secondary | ICD-10-CM

## 2018-01-28 DIAGNOSIS — B9561 Methicillin susceptible Staphylococcus aureus infection as the cause of diseases classified elsewhere: Secondary | ICD-10-CM

## 2018-01-28 DIAGNOSIS — R197 Diarrhea, unspecified: Secondary | ICD-10-CM

## 2018-01-28 DIAGNOSIS — B9562 Methicillin resistant Staphylococcus aureus infection as the cause of diseases classified elsewhere: Secondary | ICD-10-CM

## 2018-01-28 DIAGNOSIS — N898 Other specified noninflammatory disorders of vagina: Secondary | ICD-10-CM

## 2018-01-28 DIAGNOSIS — B951 Streptococcus, group B, as the cause of diseases classified elsewhere: Secondary | ICD-10-CM

## 2018-01-28 LAB — VANCOMYCIN, TROUGH: Vancomycin Tr: 16 ug/mL (ref 15–20)

## 2018-01-28 MED ORDER — FLUCONAZOLE 150 MG PO TABS
150.0000 mg | ORAL_TABLET | Freq: Once | ORAL | Status: AC
  Administered 2018-01-28: 150 mg via ORAL
  Filled 2018-01-28: qty 1

## 2018-01-28 NOTE — Progress Notes (Signed)
[facility]    Date of Admission:  01/23/2018   Total days of antibiotics 6        Day 6 vanco   ID: Cheryl Hogan is a 33 y.o. 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 She reports diarrhea that she believes is related to withdrawal. Denies fever or abdominal cramping. Bandages on hands starting to become undone  She had I x D on 1/22  Medications:  . buprenorphine-naloxone  1 tablet Sublingual BID  . Chlorhexidine Gluconate Cloth  6 each Topical Q0600  . enoxaparin (LOVENOX) injection  40 mg Subcutaneous Q24H  . mupirocin ointment  1 application Nasal BID  . nicotine  21 mg Transdermal Daily    Objective: Vital signs in last 24 hours: Temp:  [97.4 F (36.3 C)-98.7 F (37.1 C)] 97.4 F (36.3 C) (01/26 4709) Pulse Rate:  [77] 77 (01/26 0637) Resp:  [20] 20 (01/26 0637) BP: (100-118)/(75-78) 100/75 (01/26 0637) SpO2:  [98 %] 98 % (01/26 0637) Weight:  [67.8 kg] 67.8 kg (01/26 2957) Physical Exam  Constitutional:  oriented to person, place, and time. appears well-developed and well-nourished. No distress.  HENT: Blum/AT, PERRLA, no scleral icterus Mouth/Throat: Oropharynx is clear and moist. No oropharyngeal exudate.  Cardiovascular: Normal rate, regular rhythm and normal heart sounds. Exam reveals no gallop and no friction rub.  No murmur heard.  Pulmonary/Chest: Effort normal and breath sounds normal. No respiratory distress.  has no wheezes.  Neck = supple, no nuchal rigidity Abdominal: Soft. Bowel sounds are normal.  exhibits no distension. There is no tenderness.  Lymphadenopathy: no cervical adenopathy. No axillary  adenopathy Neurological: alert and oriented to person, place, and time.  Ext: bilateral hands/forearms wrapped Skin: Skin is warm and dry. Scarring from previous injection drug use Psychiatric: a normal mood and affect.  behavior is normal.    Lab Results Recent Labs    01/26/18 0450  WBC 13.0*  HGB 9.7*  HCT 29.6*  NA 139  K 3.6  CL 109  CO2 23  BUN 9  CREATININE 0.49    Microbiology: Blood cx x 2 NGTD Left HAND Wound: MRSA  Methicillin resistant staphylococcus aureus    MIC    CIPROFLOXACIN <=0.5 SENSI... Sensitive    CLINDAMYCIN <=0.25 SENS... Sensitive    ERYTHROMYCIN >=8 RESISTANT  Resistant    GENTAMICIN <=0.5 SENSI... Sensitive    Inducible Clindamycin NEGATIVE  Sensitive    OXACILLIN >=4 RESISTANT  Resistant    RIFAMPIN <=0.5 SENSI... Sensitive    TETRACYCLINE <=1 SENSITIVE  Sensitive    TRIMETH/SULFA <=10 SENSIT... Sensitive    VANCOMYCIN <=0.5 SENSI... Sensitive     Right Hand CX susceptibility 1/22: strep anginosis and MSSA   Streptococcus anginosis Staphylococcus aureus    MIC MIC    CEFTRIAXONE 0.5 SENSITIVE  Sensitive      CIPROFLOXACIN   <=0.5 SENSI... Sensitive    CLINDAMYCIN   <=0.25 SENS... Sensitive    ERYTHROMYCIN <=0.12 SENS... Sensitive <=0.25 SENS... Sensitive    GENTAMICIN   <=0.5 SENSI... Sensitive    Inducible Clindamycin   NEGATIVE  Sensitive    LEVOFLOXACIN <=0.25  SENS... Sensitive      OXACILLIN   0.5 SENSITIVE  Sensitive    PENICILLIN <=0.06 SENS... Sensitive      RIFAMPIN   <=0.5 SENSI... Sensitive    TETRACYCLINE   <=1 SENSITIVE  Sensitive    TRIMETH/SULFA   <=10 SENSIT... Sensitive    VANCOMYCIN 0.5 SENSITIVE  Sensitive <=0.5 SENSI... Sensitive           Susceptibility Comments    Studies/Results: TTE: Study Conclusions  - Left ventricle: The cavity size was normal. Systolic function was   normal. The estimated ejection fraction was in the range of 50%   to 55%. Wall motion was normal; there were no regional wall    motion abnormalities. Left ventricular diastolic function   parameters were normal. - Aortic valve: There was a possible, small vegetation on the left   ventricular aspect. Valve area (VTI): 3.37 cm^2. Valve area   (Vmax): 3.23 cm^2. Valve area (Vmean): 3.21 cm^2. - Mitral valve: There was a possible, small, 0.6 cm (L) x 0.6 cm   (W) vegetation on the atrial aspect of the anterior leaflet. - Left atrium: The atrium was mildly dilated. - Atrial septum: No defect or patent foramen ovale was identified. - Tricuspid valve: There was a possible, medium-sized, 1.3 cm (W) x   1.8 cm (L), pedunculated, mobile vegetation on the right atrial   aspect.  Assessment/Plan: 32yo F with MRSA/MSSA/strep anginosus wound infection to hands s/p I x D c/b 3 native valve endocarditis - continue on vancomycin - planned for TEE tomorrow - length of treatment likely 6 wk of vancomycin  Hand wounds s/p I x D - would follow up with dr Melvyn Novas for wound management. How often to do dressing changes  Vaginal itching = likely has candidal infection- will presumptively treat with fluconazole 150mg  x 1  Diarrhea = likely from withdrawal. No need to test for infectious causes  [facility] Cell: [phone] Pager: [phone]  01/28/2018, 1:37 PM

## 2018-01-28 NOTE — Progress Notes (Signed)
Pt seen and examined, no changes from my note yesterday -Feels okay today, no significant withdrawal symptoms any longer -Stable on IV vancomycin and ceftriaxone -Plan for TEE on Monday -Cardiology and ID following  Zannie Cove, MD

## 2018-01-29 ENCOUNTER — Inpatient Hospital Stay (HOSPITAL_COMMUNITY): Payer: Self-pay | Admitting: Certified Registered Nurse Anesthetist

## 2018-01-29 ENCOUNTER — Encounter (HOSPITAL_COMMUNITY): Payer: Self-pay | Admitting: *Deleted

## 2018-01-29 ENCOUNTER — Encounter (HOSPITAL_COMMUNITY)
Admission: EM | Disposition: A | Discharge status: Home / Self Care | Payer: Self-pay | Source: Home / Self Care | Attending: Internal Medicine

## 2018-01-29 ENCOUNTER — Inpatient Hospital Stay (HOSPITAL_COMMUNITY)
Admit: 2018-01-29 | Discharge: 2018-01-29 | Disposition: A | Discharge status: Home / Self Care | Payer: Self-pay | Attending: Cardiovascular Disease | Admitting: Cardiovascular Disease

## 2018-01-29 DIAGNOSIS — I330 Acute and subacute infective endocarditis: Secondary | ICD-10-CM

## 2018-01-29 HISTORY — PX: TEE WITHOUT CARDIOVERSION: SHX5443

## 2018-01-29 LAB — AEROBIC/ANAEROBIC CULTURE W GRAM STAIN (SURGICAL/DEEP WOUND)

## 2018-01-29 LAB — CULTURE, BLOOD (ROUTINE X 2)
Culture: NO GROWTH
Culture: NO GROWTH
Special Requests: ADEQUATE

## 2018-01-29 LAB — VANCOMYCIN, PEAK: VANCOMYCIN PK: 29 ug/mL — AB (ref 30–40)

## 2018-01-29 LAB — AEROBIC/ANAEROBIC CULTURE (SURGICAL/DEEP WOUND)

## 2018-01-29 SURGERY — ECHOCARDIOGRAM, TRANSESOPHAGEAL
Anesthesia: Monitor Anesthesia Care

## 2018-01-29 MED ORDER — LACTATED RINGERS IV SOLN
INTRAVENOUS | Status: DC | PRN
  Administered 2018-01-29: 12:00:00 via INTRAVENOUS

## 2018-01-29 MED ORDER — LIDOCAINE 2% (20 MG/ML) 5 ML SYRINGE
INTRAMUSCULAR | Status: DC | PRN
  Administered 2018-01-29: 40 mg via INTRAVENOUS

## 2018-01-29 MED ORDER — PROPOFOL 500 MG/50ML IV EMUL
INTRAVENOUS | Status: DC | PRN
  Administered 2018-01-29: 150 ug/kg/min via INTRAVENOUS

## 2018-01-29 MED ORDER — VANCOMYCIN HCL 10 G IV SOLR
1250.0000 mg | Freq: Two times a day (BID) | INTRAVENOUS | Status: DC
  Administered 2018-01-29 – 2018-01-31 (×4): 1250 mg via INTRAVENOUS
  Filled 2018-01-29 (×5): qty 1250

## 2018-01-29 MED ORDER — PROPOFOL 10 MG/ML IV BOLUS
INTRAVENOUS | Status: DC | PRN
  Administered 2018-01-29 (×2): 20 mg via INTRAVENOUS
  Administered 2018-01-29: 30 mg via INTRAVENOUS
  Administered 2018-01-29: 40 mg via INTRAVENOUS

## 2018-01-29 NOTE — Interval H&P Note (Signed)
History and Physical Interval Note:  01/29/2018 11:04 AM  [name]  has presented today for surgery, with the diagnosis of BACTEREMIA  The various methods of treatment have been discussed with the patient and family. After consideration of risks, benefits and other options for treatment, the patient has consented to  Procedure(s): TRANSESOPHAGEAL ECHOCARDIOGRAM (TEE) (N/A) as a surgical intervention .  The patient's history has been reviewed, patient examined, no change in status, stable for surgery.  I have reviewed the patient's chart and labs.  Questions were answered to the patient's satisfaction.     Cheryl Hogan

## 2018-01-29 NOTE — Transfer of Care (Signed)
Immediate Anesthesia Transfer of Care Note  Patient: Cheryl Hogan  Procedure(s) Performed: TRANSESOPHAGEAL ECHOCARDIOGRAM (TEE) (N/A )  Patient Location: Endoscopy Unit  Anesthesia Type:MAC  Level of Consciousness: drowsy  Airway & Oxygen Therapy: Patient Spontanous Breathing and Patient connected to nasal cannula oxygen  Post-op Assessment: Report given to RN and Post -op Vital signs reviewed and stable  Post vital signs: Reviewed and stable  Last Vitals:  Vitals Value Taken Time  BP    Temp    Pulse 88 01/29/2018 12:16 PM  Resp 18 01/29/2018 12:16 PM  SpO2 98 % 01/29/2018 12:16 PM  Vitals shown include unvalidated device data.  Last Pain:  Vitals:   01/29/18 1101  TempSrc: Oral  PainSc: 0-No pain      Patients Stated Pain Goal: 0 (77/82/42 3536)  Complications: No apparent anesthesia complications

## 2018-01-29 NOTE — Progress Notes (Signed)
Pharmacy Antibiotic Note  Cheryl Hogan is a 33 y.o. female admitted on 01/23/2018 with hand abscess found to have TEE concerning for endocarditis (CT noted septic emboli of the chest). Cultures show MRSA/MSSA/strep anginosus wound infection to hands. Plans are for TEE today -Vancomycin levels demonstrated peak of 29 mcg/ml and trough of 16 mcg/ml, correlating with a subtherapeutic AUC of 668 mcg*h/ml.  Plan: -Change vancomycin to 1250mg  IV q12 -Will follow renal function, cultures and clinical progress   Height: 5\' 9"  (175.3 cm) Weight: 146 lb 11.2 oz (66.5 kg) IBW/kg (Calculated) : 66.2  Temp (24hrs), Avg:98.4 F (36.9 C), Min:97.5 F (36.4 C), Max:98.9 F (37.2 C)  Recent Labs  Lab 01/23/18 2041 01/24/18 0042 01/25/18 0430 01/26/18 0450 01/26/18 1613 01/26/18 2020 01/28/18 2015 01/29/18 0134  WBC 20.6*  --  23.0* 13.0*  --   --   --   --   CREATININE 0.50  --  0.34* 0.49  --   --   --   --   LATICACIDVEN  --  0.9  --   --   --   --   --   --   VANCOTROUGH  --   --   --   --   --  9* 16  --   VANCOPEAK  --   --   --   --  22*  --   --  29*    Estimated Creatinine Clearance: 107.5 mL/min (by C-G formula based on SCr of 0.49 mg/dL).    No Known Allergies  Antimicrobials this admission: Vancomycin 1/22 >> Ceftriaxone 1/24 >> Cefepime 1/22 >> 1/24 Metronidazole 1/22 >> 1/23 Cefazoline 1/22 x1  Dose adjustments this admission: 1/24: Vp 22; Vt 9; AUC 359 >> increase vancomycin to 1250mg  IV q8h 1/27: Vp 29; Vt 16; AUC 668 >> decrease vancomycin to 1250mg  IV q12h  Microbiology results: 1/22 BCx: NGTD x48h 1/22 MRSA PCR: positive 1/22 Wound Cx (L hand): few S. Aureus 1/22 Tissue Cx (R hand): strep anginosis, S. Aureus  Thank you for allowing pharmacy to be a part of this patient's care.  Harland German, PharmD Clinical Pharmacist **Pharmacist phone directory can now be found on [email or web address] ([email or web address]).  Listed under [facility].

## 2018-01-29 NOTE — Progress Notes (Signed)
PROGRESS NOTE    Cheryl Hogan  WTU:[id number] DOB: Sep 10, 1985 DOA: 01/23/2018 PCP: Patient, [name]  Brief Narrative: 33 year old female with history of active IV heroin abuse presented to the ED with swelling abscess overlying right hand dorsal surface and left hand involving index finger. -Chest x-ray was also concerning for septic pulmonary emboli -Hand Surgery consulted  Assessment & Plan:   Sepsis, right hand dorsal abscess and left hand index finger tenosynovitis -Orthopedics consulting, status post I&D of right hand dorsal abscess and tenosynovectomy of left hand index finger 1/22 -Continue IV vancomycin, ceftriaxone, wound culture with MRSA  -Blood cultures are negative -sent message to orthopedics for wound FU and post op care  Multi valvular endocarditis -2D echocardiogram concerning for vegetations on the aortic, mitral and tricuspid valves -Blood cultures negative thus far -Continue IV vancomycin/ceftriaxone as above -ID Dr. Luciana Axe consulting -Cardiology following, plan for TEE later today  Septic emboli/obstructed lingular abscess -Continue above antibiotics -Rx of endocarditis  Positive beta hCG -Repeat is negative  Heroin abuse -Continue buprenorphine for opiate withdrawal treatment protocol  Tobacco abuse -Nicotine patch  DVT prophylaxis:  Lovenox Code Status: Full code Family Communication: No family at bedside Disposition Plan: Home pending above work-up and treatment  Consultants:   Hand surgery Dr. Melvyn Novas  Infectious disease Dr. Luciana Axe  Cardiology  Procedures: OPERATIVE PROCEDURE: #1: Left hand extensor tendon tenosynovectomy EIP and EDC to the index finger aggressive tenosynovectomy #2: Left hand incision and drainage of complex abscess dorsal finger and hand  #3: Right hand drainage of subcutaneous abscess dorsal right hand  Antimicrobials:    Subjective: -feels ok, awaiting TEE  Objective: Vitals:   01/29/18 0630 01/29/18 1101  01/29/18 1219 01/29/18 1220  BP: 111/85 114/75 (!) 86/57 (!) 86/57  Pulse: 76 98 90 (!) 111  Resp: (!) 22 12 16  (!) 21  Temp: 98.9 F (37.2 C) 98.7 F (37.1 C)    TempSrc: Oral Oral    SpO2: 98% 99% 100% 100%  Weight: 66.5 kg     Height:        Intake/Output Summary (Last 24 hours) at 01/29/2018 1333 Last data filed at 01/29/2018 1216 Gross per 24 hour  Intake 560 ml  Output 0 ml  Net 560 ml   Filed Weights   01/26/18 0502 01/28/18 0637 01/29/18 0630  Weight: 68.6 kg 67.8 kg 66.5 kg    Examination: Gen: Awake, Alert, Oriented X 3,  HEENT: PERRLA, Neck supple, no JVD Lungs: Good air movement bilaterally, CTAB CVS: S1S2/RRR Abd: soft, Non tender, non distended, BS present Extremities:No edema, both forearms/hands wrapped Skin: As above  psychiatry: Mood & affect appropriate.     Data Reviewed:   CBC: Recent Labs  Lab 01/23/18 2041 01/25/18 0430 01/26/18 0450  WBC 20.6* 23.0* 13.0*  HGB 9.9* 9.4* 9.7*  HCT 31.0* 29.0* 29.6*  MCV 84.0 82.4 83.4  PLT 666* 648* 663*   Basic Metabolic Panel: Recent Labs  Lab 01/23/18 2041 01/25/18 0430 01/26/18 0450  NA 134* 139 139  K 3.0* 3.0* 3.6  CL 99 106 109  CO2 27 24 23   GLUCOSE 126* 110* 118*  BUN 8 <5* 9  CREATININE 0.50 0.34* 0.49  CALCIUM 8.4* 7.9* 8.6*   GFR: Estimated Creatinine Clearance: 107.5 mL/min (by C-G formula based on SCr of 0.49 mg/dL). Liver Function Tests: No results for input(s): AST, ALT, ALKPHOS, BILITOT, PROT, ALBUMIN in the last 168 hours. No results for input(s): LIPASE, AMYLASE in the last 168 hours. No  results for input(s): AMMONIA in the last 168 hours. Coagulation Profile: No results for input(s): INR, PROTIME in the last 168 hours. Cardiac Enzymes: No results for input(s): CKTOTAL, CKMB, CKMBINDEX, TROPONINI in the last 168 hours. BNP (last 3 results) No results for input(s): PROBNP in the last 8760 hours. HbA1C: No results for input(s): HGBA1C in the last 72 hours. CBG: No  results for input(s): GLUCAP in the last 168 hours. Lipid Profile: No results for input(s): CHOL, HDL, LDLCALC, TRIG, CHOLHDL, LDLDIRECT in the last 72 hours. Thyroid Function Tests: No results for input(s): TSH, T4TOTAL, FREET4, T3FREE, THYROIDAB in the last 72 hours. Anemia Panel: No results for input(s): VITAMINB12, FOLATE, FERRITIN, TIBC, IRON, RETICCTPCT in the last 72 hours. Urine analysis:    Component Value Date/Time   COLORURINE YELLOW 01/23/2018 2335   APPEARANCEUR CLEAR 01/23/2018 2335   LABSPEC 1.004 (L) 01/23/2018 2335   PHURINE 7.0 01/23/2018 2335   GLUCOSEU NEGATIVE 01/23/2018 2335   HGBUR MODERATE (A) 01/23/2018 2335   BILIRUBINUR NEGATIVE 01/23/2018 2335   KETONESUR NEGATIVE 01/23/2018 2335   PROTEINUR NEGATIVE 01/23/2018 2335   NITRITE NEGATIVE 01/23/2018 2335   LEUKOCYTESUR NEGATIVE 01/23/2018 2335   Sepsis Labs: @LABRCNTIP (procalcitonin:4,lacticidven:4)  ) Recent Results (from the past 240 hour(s))  Blood Culture (routine x 2)     Status: None (Preliminary result)   Collection Time: 01/24/18  1:05 AM  Result Value Ref Range Status   Specimen Description BLOOD RIGHT ANTECUBITAL  Final   Special Requests   Final    BOTTLES DRAWN AEROBIC AND ANAEROBIC Blood Culture adequate volume   Culture   Final    NO GROWTH 4 DAYS Performed at [facility], [address], [place], [place] [address]    Report Status PENDING  Incomplete  Blood Culture (routine x 2)     Status: None (Preliminary result)   Collection Time: 01/24/18  1:26 AM  Result Value Ref Range Status   Specimen Description BLOOD LEFT ANTECUBITAL  Final   Special Requests   Final    BOTTLES DRAWN AEROBIC AND ANAEROBIC Blood Culture results may not be optimal due to an inadequate volume of blood received in culture bottles   Culture   Final    NO GROWTH 4 DAYS Performed at [facility], [address], [place], [place] [address]    Report Status PENDING  Incomplete  Surgical pcr screen      Status: Abnormal   Collection Time: 01/24/18 12:15 PM  Result Value Ref Range Status   MRSA, PCR POSITIVE (A) NEGATIVE Final    Comment: RESULT CALLED TO, READ BACK BY AND VERIFIED WITH: Lavell Luster RN 16:05 01/24/18 ([email or web address])    Staphylococcus aureus POSITIVE (A) NEGATIVE Final    Comment: (NOTE) The Xpert SA Assay (FDA approved for NASAL specimens in patients 59 years of age and older), is one component of a comprehensive surveillance program. It is not intended to diagnose infection nor to guide or monitor treatment. Performed at [facility], [address], [place], [place] [address]   Aerobic/Anaerobic Culture (surgical/deep wound)     Status: None   Collection Time: 01/24/18  7:01 PM  Result Value Ref Range Status   Specimen Description WOUND LEFT HAND  Final   Special Requests SAMPLE A  Final   Gram Stain   Final    MODERATE WBC PRESENT,BOTH PMN AND MONONUCLEAR FEW GRAM POSITIVE COCCI    Culture   Final    FEW METHICILLIN  RESISTANT STAPHYLOCOCCUS AUREUS NO ANAEROBES ISOLATED Performed at [facility], [address] [place], [place] [address]    Report Status 01/29/2018 FINAL  Final   Organism ID, Bacteria METHICILLIN RESISTANT STAPHYLOCOCCUS AUREUS  Final      Susceptibility   Methicillin resistant staphylococcus aureus - MIC*    CIPROFLOXACIN <=0.5 SENSITIVE Sensitive     ERYTHROMYCIN >=8 RESISTANT Resistant     GENTAMICIN <=0.5 SENSITIVE Sensitive     OXACILLIN >=4 RESISTANT Resistant     TETRACYCLINE <=1 SENSITIVE Sensitive     VANCOMYCIN <=0.5 SENSITIVE Sensitive     TRIMETH/SULFA <=10 SENSITIVE Sensitive     CLINDAMYCIN <=0.25 SENSITIVE Sensitive     RIFAMPIN <=0.5 SENSITIVE Sensitive     Inducible Clindamycin NEGATIVE Sensitive     * FEW METHICILLIN RESISTANT STAPHYLOCOCCUS AUREUS  Aerobic/Anaerobic Culture (surgical/deep wound)     Status: None   Collection Time: 01/24/18  7:02 PM  Result Value Ref Range Status   Specimen Description  TISSUE RIGHT HAND  Final   Special Requests SAMPLE B  Final   Gram Stain   Final    ABUNDANT WBC PRESENT, PREDOMINANTLY PMN RARE GRAM POSITIVE COCCI    Culture   Final    FEW STREPTOCOCCUS ANGINOSIS RARE STAPHYLOCOCCUS AUREUS NO ANAEROBES ISOLATED Performed at [facility], [address] [place], [place] [address]    Report Status 01/29/2018 FINAL  Final   Organism ID, Bacteria STREPTOCOCCUS ANGINOSIS  Final   Organism ID, Bacteria STAPHYLOCOCCUS AUREUS  Final      Susceptibility   Staphylococcus aureus - MIC*    CIPROFLOXACIN <=0.5 SENSITIVE Sensitive     ERYTHROMYCIN <=0.25 SENSITIVE Sensitive     GENTAMICIN <=0.5 SENSITIVE Sensitive     OXACILLIN 0.5 SENSITIVE Sensitive     TETRACYCLINE <=1 SENSITIVE Sensitive     VANCOMYCIN <=0.5 SENSITIVE Sensitive     TRIMETH/SULFA <=10 SENSITIVE Sensitive     CLINDAMYCIN <=0.25 SENSITIVE Sensitive     RIFAMPIN <=0.5 SENSITIVE Sensitive     Inducible Clindamycin NEGATIVE Sensitive     * RARE STAPHYLOCOCCUS AUREUS   Streptococcus anginosis - MIC*    PENICILLIN <=0.06 SENSITIVE Sensitive     CEFTRIAXONE 0.5 SENSITIVE Sensitive     ERYTHROMYCIN <=0.12 SENSITIVE Sensitive     LEVOFLOXACIN <=0.25 SENSITIVE Sensitive     VANCOMYCIN 0.5 SENSITIVE Sensitive     * FEW STREPTOCOCCUS ANGINOSIS         Radiology Studies: No results found.      Scheduled Meds: . buprenorphine-naloxone  1 tablet Sublingual BID  . enoxaparin (LOVENOX) injection  40 mg Subcutaneous Q24H  . nicotine  21 mg Transdermal Daily   Continuous Infusions: . sodium chloride 75 mL/hr at 01/28/18 1010  . vancomycin       LOS: 5 days    Time spent: 25min    Zannie CovePreetha Aracelia Brinson, MD [facility]  01/29/2018, 1:33 PM

## 2018-01-29 NOTE — Anesthesia Preprocedure Evaluation (Addendum)
Anesthesia Evaluation  Patient identified by MRN, date of birth, ID band Patient awake    Reviewed: Allergy & Precautions, NPO status , Patient's Chart, lab work & pertinent test results  History of Anesthesia Complications Negative for: history of anesthetic complications  Airway Mallampati: II  TM Distance: >3 FB Neck ROM: Full    Dental  (+) Teeth Intact, Dental Advisory Given   Pulmonary Current Smoker,    breath sounds clear to auscultation       Cardiovascular negative cardio ROS   Rhythm:Regular Rate:Tachycardia  ECHO 01/25/18 Study Conclusions  - Left ventricle: The cavity size was normal. Systolic function was   normal. The estimated ejection fraction was in the range of 50%   to 55%. Wall motion was normal; there were no regional wall   motion abnormalities. Left ventricular diastolic function   parameters were normal. - Aortic valve: There was a possible, small vegetation on the left   ventricular aspect.   Neuro/Psych negative neurological ROS  negative psych ROS   GI/Hepatic negative GI ROS, (+)     substance abuse  cocaine use, marijuana use, methamphetamine use and IV drug use,   Endo/Other   Hypokalemia Hypocalcemia   Renal/GU negative Renal ROS     Musculoskeletal negative musculoskeletal ROS (+) narcotic dependent  Abdominal   Peds  (+) ATTENTION DEFICIT DISORDER WITHOUT HYPERACTIVITY Hematology  (+) Blood dyscrasia, anemia ,   Anesthesia Other Findings   Reproductive/Obstetrics                             Anesthesia Physical  Anesthesia Plan  ASA: III  Anesthesia Plan: MAC   Post-op Pain Management:    Induction: Intravenous  PONV Risk Score and Plan: 3 and Treatment may vary due to age or medical condition and Ondansetron  Airway Management Planned: Nasal Cannula, Mask and Simple Face Mask  Additional Equipment: None  Intra-op Plan:    Post-operative Plan: Extubation in OR  Informed Consent: I have reviewed the patients History and Physical, chart, labs and discussed the procedure including the risks, benefits and alternatives for the proposed anesthesia with the patient or authorized representative who has indicated his/her understanding and acceptance.     Dental advisory given  Plan Discussed with: CRNA and Anesthesiologist  Anesthesia Plan Comments:        Anesthesia Quick Evaluation

## 2018-01-29 NOTE — Op Note (Signed)
INDICATIONS: infective endocarditis  PROCEDURE:   Informed consent was obtained prior to the procedure. The risks, benefits and alternatives for the procedure were discussed and the patient comprehended these risks.  Risks include, but are not limited to, cough, sore throat, vomiting, nausea, somnolence, esophageal and stomach trauma or perforation, bleeding, low blood pressure, aspiration, pneumonia, infection, trauma to the teeth and death.    After a procedural time-out, the oropharynx was anesthetized with 20% benzocaine spray.   During this procedure the patient was administered IV propofol by Anesthesiology.  The transesophageal probe was inserted in the esophagus and stomach without difficulty and multiple views were obtained.  The patient was kept under observation until the patient left the procedure room.  The patient left the procedure room in stable condition.   Agitated microbubble saline contrast was not administered.  COMPLICATIONS:    There were no immediate complications.  FINDINGS:  Normal TEE. No vegetations. No valvular abnormalities.    Time Spent Directly with the Patient:  30 minutes   Tyshawna Alarid 01/29/2018, 12:11 PM

## 2018-01-29 NOTE — Anesthesia Procedure Notes (Signed)
Procedure Name: MAC Date/Time: 01/29/2018 11:56 AM Performed by: Colin Benton, CRNA Pre-anesthesia Checklist: Patient identified, Emergency Drugs available, Suction available and Patient being monitored Patient Re-evaluated:Patient Re-evaluated prior to induction Oxygen Delivery Method: Nasal cannula Induction Type: IV induction Placement Confirmation: positive ETCO2 Dental Injury: Teeth and Oropharynx as per pre-operative assessment

## 2018-01-29 NOTE — Anesthesia Postprocedure Evaluation (Signed)
Anesthesia Post Note  Patient: Cheryl Hogan  Procedure(s) Performed: TRANSESOPHAGEAL ECHOCARDIOGRAM (TEE) (N/A )     Patient location during evaluation: PACU Anesthesia Type: MAC Level of consciousness: awake and alert Pain management: pain level controlled Vital Signs Assessment: post-procedure vital signs reviewed and stable Respiratory status: spontaneous breathing, nonlabored ventilation, respiratory function stable and patient connected to nasal cannula oxygen Cardiovascular status: stable and blood pressure returned to baseline Postop Assessment: no apparent nausea or vomiting Anesthetic complications: no    Last Vitals:  Vitals:   01/29/18 1449 01/29/18 1959  BP: 114/76 109/72  Pulse: 94 (!) 101  Resp:    Temp: 36.7 C 36.7 C  SpO2: 100% 100%    Last Pain:  Vitals:   01/29/18 1449  TempSrc: Oral  PainSc:                  Rhythm Gubbels

## 2018-01-30 ENCOUNTER — Inpatient Hospital Stay (HOSPITAL_COMMUNITY): Payer: Self-pay

## 2018-01-30 DIAGNOSIS — R0989 Other specified symptoms and signs involving the circulatory and respiratory systems: Secondary | ICD-10-CM

## 2018-01-30 DIAGNOSIS — J189 Pneumonia, unspecified organism: Secondary | ICD-10-CM

## 2018-01-30 MED ORDER — LINEZOLID 600 MG PO TABS: 600.0000 mg | ORAL_TABLET | Freq: Two times a day (BID) | ORAL | 0 refills | Status: AC

## 2018-01-30 MED ORDER — MORPHINE SULFATE (PF) 2 MG/ML IV SOLN: 1.0000 mg | INTRAVENOUS | Status: DC | PRN

## 2018-01-30 MED ORDER — BUPRENORPHINE HCL-NALOXONE HCL 8-2 MG SL SUBL: 1.0000 | SUBLINGUAL_TABLET | Freq: Two times a day (BID) | SUBLINGUAL | 0 refills | Status: DC

## 2018-01-30 MED FILL — LINEZOLID 600 MG TABS: 600 | 30 days supply | Qty: 60 | Fill #0

## 2018-01-30 NOTE — Progress Notes (Signed)
PROGRESS NOTE    Cheryl Hogan  RUE:[id number] DOB: 06/04/1985 DOA: 01/23/2018 PCP: Hosie Spangleatient, [name]  Brief Narrative: 33 year old female with history of active IV heroin abuse presented to the ED with swelling abscess overlying right hand dorsal surface and left hand involving index finger. -Chest x-ray was also concerning for septic pulmonary emboli -Hand Surgery consulted, underwent I&D ECHO noted ? Vegetations,  TEE negative for endocarditis  Assessment & Plan:   Sepsis, right hand dorsal abscess and left hand index finger tenosynovitis -Orthopedics consulted, status post I&D of right hand dorsal abscess and tenosynovectomy of left hand index finger 1/22 -treated with IV vancomycin, ceftriaxone, wound culture with MRSA, MSSA and strep -Blood cultures are negative -dressing changed yesterday -[facility], CM and CSW consulted  ?? Multi valvular endocarditis -2D echocardiogram was concerning for vegetations on the aortic, mitral and tricuspid valves -Blood cultures negative, treated with IV vancomycin/ceftriaxone as above -ID Dr. Luciana Axeomer consulted, TEE yesterday negative for any endocarditis -change to PO Abx for hand abscess and septi lung emboli likley from prior IE  Septic emboli/obstructed lingular abscess -d/w ID Dr.Comer, TEE negative for infective endocarditis at this time, however could have had prior episodes from long history of IV heroin abuse -Blood cultures are negative -Plan to transition to oral Zyvox for 4 weeks  Positive beta hCG -Repeat is negative  Heroin abuse -Started on buprenorphine for opiate withdrawal treatment protocol, current currently tolerating 8 mg sublingual twice daily dose, wishes to continue this at discharge to ensure long-term abstinence from heroin -Called [place] [facility] for follow-up, they will attempt to arrange follow-up next week, give patient a short supply of Suboxone at discharge  Tobacco abuse -Nicotine  patch  DVT prophylaxis:  Lovenox Code Status: Full code Family Communication: No family at bedside Disposition Plan: Home pending above work-up and treatment  Consultants:   Hand surgery Dr. Melvyn Novasrtmann  Infectious disease Dr. Luciana Axeomer  Cardiology  Procedures: OPERATIVE PROCEDURE: #1: Left hand extensor tendon tenosynovectomy EIP and EDC to the index finger aggressive tenosynovectomy #2: Left hand incision and drainage of complex abscess dorsal finger and hand  #3: Right hand drainage of subcutaneous abscess dorsal right hand  Antimicrobials:    Subjective: -Feels well, no complaints, asking about next plans, wishes to continue Suboxone  Objective: Vitals:   01/30/18 0405 01/30/18 0800 01/30/18 1100 01/30/18 1516  BP: 104/73 110/73 116/88 115/80  Pulse: 87 100 96   Resp:  19 20   Temp: 97.8 F (36.6 C) 97.8 F (36.6 C) 97.8 F (36.6 C) 98.6 F (37 C)  TempSrc:  Oral Oral Oral  SpO2: 100%     Weight: 67.2 kg     Height:        Intake/Output Summary (Last 24 hours) at 01/30/2018 1539 Last data filed at 01/30/2018 1300 Gross per 24 hour  Intake 2014.94 ml  Output -  Net 2014.94 ml   Filed Weights   01/28/18 0637 01/29/18 0630 01/30/18 0405  Weight: 67.8 kg 66.5 kg 67.2 kg    Examination: Gen: Awake, Alert, Oriented X 3, no distress HEENT: PERRLA, Neck supple, no JVD Lungs: Good air movement bilaterally, CTAB CVS: S1S2/RRR Abd: soft, Non tender, non distended, BS present Skin: as above Extremities:No edema, both forearms/hands wrapped  psychiatry: Mood & affect appropriate.     Data Reviewed:   CBC: Recent Labs  Lab 01/23/18 2041 01/25/18 0430 01/26/18 0450  WBC 20.6* 23.0* 13.0*  HGB 9.9* 9.4* 9.7*  HCT  31.0* 29.0* 29.6*  MCV 84.0 82.4 83.4  PLT 666* 648* 663*   Basic Metabolic Panel: Recent Labs  Lab 01/23/18 2041 01/25/18 0430 01/26/18 0450  NA 134* 139 139  K 3.0* 3.0* 3.6  CL 99 106 109  CO2 27 24 23   GLUCOSE 126* 110* 118*  BUN 8  <5* 9  CREATININE 0.50 0.34* 0.49  CALCIUM 8.4* 7.9* 8.6*   GFR: Estimated Creatinine Clearance: 107.5 mL/min (by C-G formula based on SCr of 0.49 mg/dL). Liver Function Tests: No results for input(s): AST, ALT, ALKPHOS, BILITOT, PROT, ALBUMIN in the last 168 hours. No results for input(s): LIPASE, AMYLASE in the last 168 hours. No results for input(s): AMMONIA in the last 168 hours. Coagulation Profile: No results for input(s): INR, PROTIME in the last 168 hours. Cardiac Enzymes: No results for input(s): CKTOTAL, CKMB, CKMBINDEX, TROPONINI in the last 168 hours. BNP (last 3 results) No results for input(s): PROBNP in the last 8760 hours. HbA1C: No results for input(s): HGBA1C in the last 72 hours. CBG: No results for input(s): GLUCAP in the last 168 hours. Lipid Profile: No results for input(s): CHOL, HDL, LDLCALC, TRIG, CHOLHDL, LDLDIRECT in the last 72 hours. Thyroid Function Tests: No results for input(s): TSH, T4TOTAL, FREET4, T3FREE, THYROIDAB in the last 72 hours. Anemia Panel: No results for input(s): VITAMINB12, FOLATE, FERRITIN, TIBC, IRON, RETICCTPCT in the last 72 hours. Urine analysis:    Component Value Date/Time   COLORURINE YELLOW 01/23/2018 2335   APPEARANCEUR CLEAR 01/23/2018 2335   LABSPEC 1.004 (L) 01/23/2018 2335   PHURINE 7.0 01/23/2018 2335   GLUCOSEU NEGATIVE 01/23/2018 2335   HGBUR MODERATE (A) 01/23/2018 2335   BILIRUBINUR NEGATIVE 01/23/2018 2335   KETONESUR NEGATIVE 01/23/2018 2335   PROTEINUR NEGATIVE 01/23/2018 2335   NITRITE NEGATIVE 01/23/2018 2335   LEUKOCYTESUR NEGATIVE 01/23/2018 2335   Sepsis Labs: @LABRCNTIP (procalcitonin:4,lacticidven:4)  ) Recent Results (from the past 240 hour(s))  Blood Culture (routine x 2)     Status: None   Collection Time: 01/24/18  1:05 AM  Result Value Ref Range Status   Specimen Description BLOOD RIGHT ANTECUBITAL  Final   Special Requests   Final    BOTTLES DRAWN AEROBIC AND ANAEROBIC Blood Culture  adequate volume   Culture   Final    NO GROWTH 5 DAYS Performed at [facility], [address] [place], [place] [address]    Report Status 01/29/2018 FINAL  Final  Blood Culture (routine x 2)     Status: None   Collection Time: 01/24/18  1:26 AM  Result Value Ref Range Status   Specimen Description BLOOD LEFT ANTECUBITAL  Final   Special Requests   Final    BOTTLES DRAWN AEROBIC AND ANAEROBIC Blood Culture results may not be optimal due to an inadequate volume of blood received in culture bottles   Culture   Final    NO GROWTH 5 DAYS Performed at [facility], [address] [place], [place] [address]    Report Status 01/29/2018 FINAL  Final  Surgical pcr screen     Status: Abnormal   Collection Time: 01/24/18 12:15 PM  Result Value Ref Range Status   MRSA, PCR POSITIVE (A) NEGATIVE Final    Comment: RESULT CALLED TO, READ BACK BY AND VERIFIED WITH: Lavell Luster RN 16:05 01/24/18 ([email or web address])    Staphylococcus aureus POSITIVE (A) NEGATIVE Final    Comment: (NOTE) The Xpert SA Assay (FDA approved for NASAL specimens in patients 22 years  of age and older), is one component of a comprehensive surveillance program. It is not intended to diagnose infection nor to guide or monitor treatment. Performed at [facility], [address] [place], [place] [address]   Aerobic/Anaerobic Culture (surgical/deep wound)     Status: None   Collection Time: 01/24/18  7:01 PM  Result Value Ref Range Status   Specimen Description WOUND LEFT HAND  Final   Special Requests SAMPLE A  Final   Gram Stain   Final    MODERATE WBC PRESENT,BOTH PMN AND MONONUCLEAR FEW GRAM POSITIVE COCCI    Culture   Final    FEW METHICILLIN RESISTANT STAPHYLOCOCCUS AUREUS NO ANAEROBES ISOLATED Performed at [facility], [address] [place], [place] [address]    Report Status 01/29/2018 FINAL  Final   Organism ID, Bacteria METHICILLIN RESISTANT STAPHYLOCOCCUS AUREUS  Final       Susceptibility   Methicillin resistant staphylococcus aureus - MIC*    CIPROFLOXACIN <=0.5 SENSITIVE Sensitive     ERYTHROMYCIN >=8 RESISTANT Resistant     GENTAMICIN <=0.5 SENSITIVE Sensitive     OXACILLIN >=4 RESISTANT Resistant     TETRACYCLINE <=1 SENSITIVE Sensitive     VANCOMYCIN <=0.5 SENSITIVE Sensitive     TRIMETH/SULFA <=10 SENSITIVE Sensitive     CLINDAMYCIN <=0.25 SENSITIVE Sensitive     RIFAMPIN <=0.5 SENSITIVE Sensitive     Inducible Clindamycin NEGATIVE Sensitive     * FEW METHICILLIN RESISTANT STAPHYLOCOCCUS AUREUS  Aerobic/Anaerobic Culture (surgical/deep wound)     Status: None   Collection Time: 01/24/18  7:02 PM  Result Value Ref Range Status   Specimen Description TISSUE RIGHT HAND  Final   Special Requests SAMPLE B  Final   Gram Stain   Final    ABUNDANT WBC PRESENT, PREDOMINANTLY PMN RARE GRAM POSITIVE COCCI    Culture   Final    FEW STREPTOCOCCUS ANGINOSIS RARE STAPHYLOCOCCUS AUREUS NO ANAEROBES ISOLATED Performed at [facility], [address] [place], [place] [address]    Report Status 01/29/2018 FINAL  Final   Organism ID, Bacteria STREPTOCOCCUS ANGINOSIS  Final   Organism ID, Bacteria STAPHYLOCOCCUS AUREUS  Final      Susceptibility   Staphylococcus aureus - MIC*    CIPROFLOXACIN <=0.5 SENSITIVE Sensitive     ERYTHROMYCIN <=0.25 SENSITIVE Sensitive     GENTAMICIN <=0.5 SENSITIVE Sensitive     OXACILLIN 0.5 SENSITIVE Sensitive     TETRACYCLINE <=1 SENSITIVE Sensitive     VANCOMYCIN <=0.5 SENSITIVE Sensitive     TRIMETH/SULFA <=10 SENSITIVE Sensitive     CLINDAMYCIN <=0.25 SENSITIVE Sensitive     RIFAMPIN <=0.5 SENSITIVE Sensitive     Inducible Clindamycin NEGATIVE Sensitive     * RARE STAPHYLOCOCCUS AUREUS   Streptococcus anginosis - MIC*    PENICILLIN <=0.06 SENSITIVE Sensitive     CEFTRIAXONE 0.5 SENSITIVE Sensitive     ERYTHROMYCIN <=0.12 SENSITIVE Sensitive     LEVOFLOXACIN <=0.25 SENSITIVE Sensitive     VANCOMYCIN 0.5 SENSITIVE  Sensitive     * FEW STREPTOCOCCUS ANGINOSIS         Radiology Studies: Dg Chest 2 View  Result Date: 01/30/2018 CLINICAL DATA:  Shortness of breath.  Septic embolism. EXAM: CHEST - 2 VIEW COMPARISON:  CT scan of January 24, 2018. Radiographs of January 23, 2018. FINDINGS: The heart size and mediastinal contours are within normal limits. No pneumothorax or pleural effusion is noted. Right lung is clear. Decreased left upper lobe  opacity is noted suggesting improving pneumonia. Continued presence of probable cavitary abnormality seen in left upper lobe. The visualized skeletal structures are unremarkable. IMPRESSION: Improving left upper lobe pneumonia with persistent cavitary abnormality. Electronically Signed   By: Lupita Raider, M.D.   On: 01/30/2018 11:49        Scheduled Meds: . buprenorphine-naloxone  1 tablet Sublingual BID  . enoxaparin (LOVENOX) injection  40 mg Subcutaneous Q24H  . nicotine  21 mg Transdermal Daily   Continuous Infusions: . vancomycin 1,250 mg (01/30/18 0402)     LOS: 6 days    Time spent:    Zannie Cove, MD [facility]  01/30/2018, 3:39 PM

## 2018-01-30 NOTE — Progress Notes (Signed)
[facility]    Date of Admission:  01/23/2018   Total days of antibiotics 8        Day 8 vanco   ID: Cheryl Hogan is a 33 y.o. female with SSTI/hand abscess s/p I x D on 1/22 Principal Problem:   Sepsis (HCC) Active Problems:   Abscess of right hand   Cellulitis of left hand   Suspected endocarditis   Heroin abuse (HCC)   Pulmonary abscess (HCC)   IVDU (intravenous drug user)   Interval Hx: Preparing for discharge - TEE w/o any evidence of endocarditis. She will continue care with suboxone managed through IM Team.  She had I x D on 1/22  Medications:  . buprenorphine-naloxone  1 tablet Sublingual BID  . enoxaparin (LOVENOX) injection  40 mg Subcutaneous Q24H  . nicotine  21 mg Transdermal Daily    Objective: Vital signs in last 24 hours: Temp:  [97.8 F (36.6 C)-98 F (36.7 C)] 97.8 F (36.6 C) (01/28 1100) Pulse Rate:  [87-101] 96 (01/28 1100) Resp:  [19-20] 20 (01/28 1100) BP: (104-116)/(72-88) 116/88 (01/28 1100) SpO2:  [100 %] 100 % (01/28 0405) Weight:  [67.2 kg] 67.2 kg (01/28 0405)  Physical Exam  Constitutional:  oriented to person, place, and time. appears well-developed and well-nourished. No distress.  HENT: [place]/AT, PERRLA, no scleral icterus Mouth/Throat: Oropharynx is clear and moist. No oropharyngeal exudate.  Cardiovascular: Normal rate, regular rhythm and normal heart sounds. Exam reveals no gallop and no friction rub.  No murmur heard.  Pulmonary/Chest: Effort normal and breath sounds normal. No respiratory distress.  has no wheezes.  Neck = supple, no nuchal rigidity Abdominal: Soft. Bowel sounds are normal.  exhibits no distension. There is no tenderness.  Lymphadenopathy: no cervical adenopathy. No axillary adenopathy Neurological: alert and oriented to person, place, and time.  Ext: bilateral hands/forearms wrapped Skin: Skin is warm and dry. Scarring from previous injection drug use Psychiatric: a normal mood and  affect.  behavior is normal.    Lab Results No results for input(s): WBC, HGB, HCT, NA, K, CL, CO2, BUN, CREATININE, GLU in the last 72 hours.  Invalid input(s): PLATELETS  Microbiology: Blood cx x 2 NGTD Left HAND Wound: MRSA  Methicillin resistant staphylococcus aureus    MIC    CIPROFLOXACIN <=0.5 SENSI... Sensitive    CLINDAMYCIN <=0.25 SENS... Sensitive    ERYTHROMYCIN >=8 RESISTANT  Resistant    GENTAMICIN <=0.5 SENSI... Sensitive    Inducible Clindamycin NEGATIVE  Sensitive    OXACILLIN >=4 RESISTANT  Resistant    RIFAMPIN <=0.5 SENSI... Sensitive    TETRACYCLINE <=1 SENSITIVE  Sensitive    TRIMETH/SULFA <=10 SENSIT... Sensitive    VANCOMYCIN <=0.5 SENSI... Sensitive     Right Hand CX susceptibility 1/22: strep anginosis and MSSA   Streptococcus anginosis Staphylococcus aureus    MIC MIC    CEFTRIAXONE 0.5 SENSITIVE  Sensitive      CIPROFLOXACIN   <=0.5 SENSI... Sensitive    CLINDAMYCIN   <=0.25 SENS... Sensitive    ERYTHROMYCIN <=0.12 SENS... Sensitive <=0.25 SENS... Sensitive    GENTAMICIN   <=0.5 SENSI... Sensitive    Inducible Clindamycin   NEGATIVE  Sensitive    LEVOFLOXACIN <=0.25 SENS... Sensitive      OXACILLIN   0.5 SENSITIVE  Sensitive    PENICILLIN <=0.06 SENS... Sensitive      RIFAMPIN   <=0.5 SENSI... Sensitive    TETRACYCLINE   <=1 SENSITIVE  Sensitive  TRIMETH/SULFA   <=10 SENSIT... Sensitive    VANCOMYCIN 0.5 SENSITIVE  Sensitive <=0.5 SENSI... Sensitive           Susceptibility Comments    Studies/Results: TTE: Study Conclusions  - Left ventricle: The cavity size was normal. Systolic function was   normal. The estimated ejection fraction was in the range of 50%   to 55%. Wall motion was normal; there were no regional wall   motion abnormalities. Left ventricular diastolic function   parameters were normal. - Aortic valve: There was a possible, small vegetation on the left   ventricular aspect. Valve area (VTI): 3.37 cm^2. Valve area    (Vmax): 3.23 cm^2. Valve area (Vmean): 3.21 cm^2. - Mitral valve: There was a possible, small, 0.6 cm (L) x 0.6 cm   (W) vegetation on the atrial aspect of the anterior leaflet. - Left atrium: The atrium was mildly dilated. - Atrial septum: No defect or patent foramen ovale was identified. - Tricuspid valve: There was a possible, medium-sized, 1.3 cm (W) x   1.8 cm (L), pedunculated, mobile vegetation on the right atrial   aspect.  TEE: 01/29/18 LV EF: 60% -   65%  ------------------------------------------------------------------- Study Conclusions  - Left ventricle: The cavity size was normal. Wall thickness was   normal. Systolic function was normal. The estimated ejection   fraction was in the range of 60% to 65%. Wall motion was normal;   there were no regional wall motion abnormalities. - Aortic valve: No evidence of vegetation. No evidence of   vegetation. - Mitral valve: No evidence of vegetation. - Right atrium: No evidence of thrombus in the atrial cavity or   appendage. - Atrial septum: No defect or patent foramen ovale was identified. - Pulmonic valve: No evidence of vegetation.  Impressions:  - No evidence of endocarditis. There was no evidence of a   vegetation.    Assessment/Plan: 33yo F with MRSA/MSSA/strep anginosus wound infection to hands s/p I x D.   Suspected Endocarditis = Initially concern for 3-valve endocarditis however TEE today shows no evidence of this. She does have pulmonary infection that is most consistent with septic emboli from right sided endocarditis given her history. No bacteremia detected on current admission. MRSA/Strep species b/l hand abscesses that have been drained. Will treat with 4 weeks of linezolid therapy - TOC pharmacy to provide rx for her.   Abnormal CT Chest = findings of multifocal pneumonia with LUL consolidation concerning for infection, abscess/septic emboli. Linezolid to cover all pathogens on hands that are highly  suspect to have mobilized to lungs. Will repeat chest x ray at end of therapy to ensure resolution.   Hand wounds s/p I x D - follow up with dr Melvyn Novasortmann for wound management on dressing instructions. Treatment as above.   She has a follow up appointment with me on February 12th @ 2:30 pm arranged for follow up lab work and arranging a follow up Chest XRay.   Cheryl AlbertsStephanie Alejos Reinhardt, MSN, NP-C [facility]  [email or web address] Pager: [phone]

## 2018-01-30 NOTE — Care Management Note (Addendum)
Case Management Note  Patient Details  Name: Cheryl Hogan MRN: [id number] Date of Birth: 1985/03/01  Subjective/Objective:  Pt presented for chest pain and increased swelling to left hand. PTA Independent- lives in a tent with significant other.                   Action/Plan: CM did schedule patient a hospital follow up appointment at [facility] [place]. Will place information on AVS. Patient will be able to utilize the [facility] for medications post hospitalization- cost ranging from $4.00-$10.00. Patient will need new Rx's sent to [facility] so medications can be delivered to bedside. CM and MD discussed that MD will call Internal Medicine to see if physician can follow for Suboxone. CM will continue to monitor for additional needs.   Expected Discharge Date:                  Expected Discharge Plan:  Home/Self Care  In-House Referral:  Clinical Social Work  Discharge planning Services  CM Consult, Indigent Health Clinic, Medication Assistance, Follow-up appt scheduled  Post Acute Care Choice:  NA Choice offered to:  NA  DME Arranged:  N/A DME Agency:  NA  HH Arranged:  NA HH Agency:  NA  Status of Service:  Completed, signed off  If discussed at [employer] of Stay Meetings, dates discussed:    Additional Comments: 1030 01-31-18 Tomi Bamberger, RN,BSN [phone] CM spoke with patient regarding hospital follow up appointments and clinic for medication assistance. Rx for Suboxone is on the shadow chart-CM was able to speak with Victorino Dike at Internal Medicine and patient to call for intake appointment. CM called outpatient pharmacy for Suboxone- in-stock and was able to fill. CM picked up medications. Staff RN Debby Bud to provide to patient. No further needs from CM at this time.  Gala Lewandowsky, RN 01/30/2018, 1:24 PM

## 2018-01-30 NOTE — Progress Notes (Signed)
Attempted to get instructions on dressing changes for patient. Called [employer] and requested to speak with Dr Melvyn Novas. Spoke with Alexa and was told she would contact Him and call me back.

## 2018-01-30 NOTE — Social Work (Signed)
CSW met with patient at bedside to follow up on substance use treatment resources. Did previously provide lists and discussed supporting with referrals for residential treatment facilities. Patient now prefers outpatient treatment and has started to follow through with calls to some treatment centers.   Noted patient wants to continue on suboxone after discharge. Patient indicated she is now connected with internal medicine clinic for suboxone. CSW provided list of of other clinics in the area that do provide suboxone.   Patient had questions about applying for Medicaid. Financial counseling did see patient towards the beginning of patient's admission, but patient does not recall their visit, likely due to active withdrawals. CSW spoke to Marne in [facility] and she will revisit patient.  Did also previously provide patient with shelter list. Patient lives in a tent with her spouse. Spouse has income from employment and reports he is looking for permanent housing.  CSW will sign off, as all concerns addressed and patient tentatively planned for discharge tomorrow. Please re-consult if additional needs arise.   Estanislado Emms, LCSW [phone]

## 2018-01-31 ENCOUNTER — Encounter (HOSPITAL_COMMUNITY): Payer: Self-pay | Admitting: Cardiovascular Disease

## 2018-01-31 ENCOUNTER — Telehealth: Payer: Self-pay

## 2018-01-31 LAB — BASIC METABOLIC PANEL
Anion gap: 10 (ref 5–15)
BUN: 21 mg/dL — ABNORMAL HIGH (ref 6–20)
CALCIUM: 9.2 mg/dL (ref 8.9–10.3)
CO2: 24 mmol/L (ref 22–32)
Chloride: 101 mmol/L (ref 98–111)
Creatinine, Ser: 0.57 mg/dL (ref 0.44–1.00)
GFR calc Af Amer: >60 mL/min (ref >60)
GFR calc non Af Amer: >60 mL/min (ref >60)
Glucose, Bld: 94 mg/dL (ref 70–99)
Potassium: 4.5 mmol/L (ref 3.5–5.1)
Sodium: 135 mmol/L (ref 135–145)

## 2018-01-31 MED FILL — BUPRENORPHIN-NALOXON 8-2 MG: 8-2 | 7 days supply | Qty: 14 | Fill #0

## 2018-01-31 NOTE — Telephone Encounter (Signed)
Please put her on the schedule for 2/4 as a new patient.  Please call patient with the time, she will have to make this appointment to get refills from [place] as we have not seen her to evaluate her in the inpatient side.  Thanks

## 2018-01-31 NOTE — Plan of Care (Signed)
  Problem: Education: Goal: Knowledge of General Education information will improve Description Including pain rating scale, medication(s)/side effects and non-pharmacologic comfort measures Outcome: Progressing   Problem: Clinical Measurements: Goal: Ability to maintain clinical measurements within normal limits will improve Outcome: Progressing Goal: Diagnostic test results will improve Outcome: Progressing   Problem: Clinical Measurements: Goal: Respiratory complications will improve Outcome: Completed/Met   Problem: Activity: Goal: Risk for activity intolerance will decrease Outcome: Completed/Met   Problem: Elimination: Goal: Will not experience complications related to bowel motility Outcome: Completed/Met Goal: Will not experience complications related to urinary retention Outcome: Completed/Met

## 2018-01-31 NOTE — Telephone Encounter (Signed)
Would like to speak with Cheryl Hogan for OUD appt.

## 2018-01-31 NOTE — Telephone Encounter (Signed)
Patient scheduled in OUD 02/06/2018 at 1045. She states she is very excited. Understands she must keep this appt to receive refills on suboxone. Kinnie Feil, RN, BSN

## 2018-01-31 NOTE — Telephone Encounter (Signed)
Received call from Steward Drone, RN Case Manager on 6E. States patient will be discharged today and has been given 1 week's worth of suboxone but TOC price is $100 which patient cannot afford. Wanted to know if we could get it cheaper. Informed her that [facility] is where many of our patient's go. States she will call them. Wanted to make OUD appt for patient. Explained intake process. Will forward to OUD Attendings. Kinnie Feil, RN, BSN

## 2018-01-31 NOTE — Discharge Instructions (Signed)
KEEP BANDAGE CLEAN AND DRY CALL OFFICE FOR F/U APPT [phone] in 2 days KEEP HAND ELEVATED ABOVE HEART OK TO APPLY ICE TO OPERATIVE AREA CONTACT OFFICE IF ANY WORSENING PAIN OR CONCERNS.  

## 2018-02-06 ENCOUNTER — Telehealth: Payer: Self-pay | Admitting: *Deleted

## 2018-02-06 ENCOUNTER — Other Ambulatory Visit: Payer: Self-pay

## 2018-02-06 ENCOUNTER — Ambulatory Visit (INDEPENDENT_AMBULATORY_CARE_PROVIDER_SITE_OTHER): Payer: Self-pay | Admitting: Internal Medicine

## 2018-02-06 VITALS — BP 103/65 | HR 80 | Temp 97.7°F | Ht 69.0 in | Wt 154.9 lb

## 2018-02-06 DIAGNOSIS — F1120 Opioid dependence, uncomplicated: Secondary | ICD-10-CM | POA: Insufficient documentation

## 2018-02-06 DIAGNOSIS — L02511 Cutaneous abscess of right hand: Secondary | ICD-10-CM

## 2018-02-06 DIAGNOSIS — F419 Anxiety disorder, unspecified: Secondary | ICD-10-CM

## 2018-02-06 DIAGNOSIS — F411 Generalized anxiety disorder: Secondary | ICD-10-CM

## 2018-02-06 DIAGNOSIS — R748 Abnormal levels of other serum enzymes: Secondary | ICD-10-CM

## 2018-02-06 DIAGNOSIS — F339 Major depressive disorder, recurrent, unspecified: Secondary | ICD-10-CM

## 2018-02-06 DIAGNOSIS — F32A Anxiety disorder, unspecified: Secondary | ICD-10-CM | POA: Insufficient documentation

## 2018-02-06 DIAGNOSIS — R768 Other specified abnormal immunological findings in serum: Secondary | ICD-10-CM

## 2018-02-06 DIAGNOSIS — F329 Major depressive disorder, single episode, unspecified: Secondary | ICD-10-CM | POA: Insufficient documentation

## 2018-02-06 DIAGNOSIS — L02512 Cutaneous abscess of left hand: Secondary | ICD-10-CM

## 2018-02-06 MED ORDER — BUPRENORPHINE HCL-NALOXONE HCL 8-2 MG SL SUBL: 1.0000 | SUBLINGUAL_TABLET | Freq: Two times a day (BID) | SUBLINGUAL | 0 refills | Status: DC

## 2018-02-06 MED FILL — BUPRENORPHIN-NALOXON 8-2 MG: 8-2 | 7 days supply | Qty: 14 | Fill #0

## 2018-02-06 NOTE — Assessment & Plan Note (Signed)
Patient endorses notable anxiety and depression with a GAD-7 score of 17 and PHQ-9 score of 16. She stated today that she has experienced symptoms since the loss of her mother at age 33. She is in contact is in the process of choosing a behavioral health specialist when she leaves today.   She denied suicidal thoughts or plan and stated that she has much to live for.

## 2018-02-06 NOTE — Progress Notes (Signed)
02/06/2018  Cheryl Hogan presents for buprenorphine/naloxone intake visit.   I have reviewed EPIC data including labwork which was available.  I have reviewed outside records provided by patient if available.  The salient points were confirmed with the patient.    Review of substance use history (first use, substances used, any illicit purchases): First used at age 33 when her mother passed. Used prescription pills regularly until she moved to the area and could only find heroin. 2-4 grams per day. Marijuana, cocaine, methamphetamine, and opioid pain medications.   Last substance used: 01/23/2018  If last substance not an opioid, last opioid used (type, dose, route, withdrawal symptoms):  Used heroin, IV 2grams. Diaphoresis, chills, nausea, diarrhea, myalgias, restless legs and arms.  Mental Health History: [facility] in [facility] for psychological evaluation. Has a diagnosis of MDD and GAD.   Current counseling/behavioural health provider: Does not have one, plans to establish with either [facility] or [place].   This patient has Opioid Use Disorder by following DSM-V criteria: Keep those that apply.  - Opioids taken in larger amounts or over a longer period than intended - Persistent desire to cut down - A great deal of time is spent to obtain/use/recover from the opioid - Cravings to use opioids - Use resulting in a failure to fulfill major role obligations - Continue opioid use despite persistent social or interpersonal problems - Important activities are given up or reduced because of opioid use - Recurrent opioid use in situations in which it is physically hazardous - Use despite knowledge of health problems caused by opioids - Tolerance - Withdrawal  Past Medical History:  Diagnosis Date  . ADD (attention deficit disorder)   . Heroin abuse (HCC)   . IVDU (intravenous drug user)     Current Outpatient Medications on File Prior to Visit  Medication Sig Dispense Refill  .  buprenorphine-naloxone (SUBOXONE) 8-2 mg SUBL SL tablet Place 1 tablet under the tongue 2 (two) times daily for 7 days. 14 tablet 0  . linezolid (ZYVOX) 600 MG tablet Take 1 tablet (600 mg total) by mouth 2 (two) times daily for 30 days. 60 tablet 0   No current facility-administered medications on file prior to visit.     Physical Exam  Vitals:   02/06/18 1106  BP: 103/65  Pulse: 80  Temp: 97.7 F (36.5 C)  TempSrc: Oral  SpO2: 100%  Weight: 154 lb 14.4 oz (70.3 kg)  Height: 5\' 9"  (1.753 m)    Clinical Opiate Withdrawal Scale: bold applicable COWS scoring   - Resting HR:    - 0 for < 80   - 1 for 81 - 100   - 2 for 101 - 120   - 4 for > 120  - Sweating:   - 0 for no chills/flushing   - 1 for subjective chills/flushing   - 3 for beads of sweat on brow/face   - 4 for sweat streaming off of face  - Restlessness:    - 0 for able to sit still   - 1 for subjective difficulty sitting still   - 3 for frequent shifting or extraneous movement   - 5 for unable to sit still for more than a few seconds  - Pupil size:    - 0 for pinpoint or normal   - 1 for possibly larger than normal   - 2 for moderately dilated   - 5 for only iris rim visible  - Bone/joint pain:    -  0 for not present   - 1 for mild diffuse discomfort   - 2 severe diffuse aching   - 4 for objectively rubbing joints/muscles and obviously in pain  - Runny nose/tearing:    - 0 for not present   - 1 for stuffy nose/moist eyes   - 2 for nose running/tearing   - 4 for nose constantly running or tears streaming down cheeks  - GI Upset:    - 0 for no GI symptoms   - 1 for stomach cramps   - 2 for nausea or loose stool   - 3 for vomiting or diarrhea   - 5 for multiple episodes of vomiting or diarrhea  - Tremor observation of outstretched hands:    - 0 for no tremor   - 1 for tremor can be felt but not observed   - 2 for slight tremor observable   - 4 for gross tremor or muscle twitching  - Yawning:    - 0  for no yawning   - 1 for yawning once or twice during assessment   - 2 for yawning three or more times during assessment   - 4 for yawning several times per minute  - Anxiety or irritability:    - 0 for none   - 1 for patient reports increasing irritability or anxiousness   - 2 for patient obviously irritable/anxious   - 4 for patient so irritable/anxious that assessment is difficult  - Gooseflesh:    - 0 for skin is smooth   - 3 for piloerection of skin can be felt or seen   - 5 for prominent piloerection  TOTAL: 7  Assessment/Plan:   Based on a review of the patient's medical history including substance use and mental health factors, and physical exam, Viyona Gutmann is a suitable candidate for MAT with buprenorphine/naloxone.  UDS ordered this visit.    I have discussed HIV and Hepatitis C screening with this patient.    I will place orders for CMET for baseline LFT evaluation if needed.    Home Induction:   I have instructed the patient how to appropriately take this medication, including placing under the tongue with head relaxed for 10 minutes and allowing to dissolve without chewing or swallowing tab/film, and with nothing to eat or drink in the subsequent 15 minutes.  They have been told not to start taking the medication until they have significant signs of withdrawal and I have explained the concept of precipitated withdrawal with the patient.    Intervisit Care:  I will call the patient the morning after induction to assess symptom burden and determine the need for additional dose titration.  We discussed this medication must be kept in a safe place and away from children.   We will see the patient back in 1 week in clinic, with options for a sooner appointment based on patient and provider preference.   Patient was encouraged to call the office and speak with the MD on call for any urgent concerns.    Lanelle Bal, MD 02/06/2018 11:11 AM

## 2018-02-06 NOTE — Telephone Encounter (Signed)
Left message on patient's self-identified VM with next week's appt, 02/13/2018 at 1015. Requested return call to confirm this. Kinnie Feil, RN, BSN

## 2018-02-06 NOTE — Assessment & Plan Note (Addendum)
The patient stated that she last used heroin the day she was admitted to the hospital. She endorses a concomitant history of depression and anxiety never fully having recovered from the loss of her mother at age 34, the year she began using illicit drugs. Her drug of choice was initially prescription opioids but was unable to obtain those when she moved to [facility]. As a result, she began using heroin. She was hospitalized for a hand abscess and started on suboxone prior to discharge.  The patient is positive for severe opioid use disorder and appears to be appropriate for continued suboxone therapy.  She will likely need weekly follow-up appointments until stable.  A database search was positive for the suboxone only  Plan: Will obtain liver profile and Hep C screening today Urine tox ordered Continue suboxone 8mg  BID

## 2018-02-06 NOTE — Patient Instructions (Addendum)
Please schedule at the front desk for a one week follow-up.  Also, as we discussed, if you wish to see a primary care doctor you may be eligible for financial assistance for which I have included information that you would need to bring to said appointment. Please call and schedule this if you wish. In addition, you may consider our behavioral health specialist in house as well as the others you have discussed. Please do not hesitate to call with questions.

## 2018-02-07 ENCOUNTER — Telehealth: Payer: Self-pay | Admitting: Internal Medicine

## 2018-02-07 LAB — HEPATIC FUNCTION PANEL
ALBUMIN: 3.8 g/dL — AB (ref 3.9–5.0)
ALT: 90 IU/L — ABNORMAL HIGH (ref 0–32)
AST: 83 IU/L — ABNORMAL HIGH (ref 0–40)
Alkaline Phosphatase: 175 IU/L — ABNORMAL HIGH (ref 39–117)
Bilirubin Total: 0.3 mg/dL (ref 0.0–1.2)
Bilirubin, Direct: 0.14 mg/dL (ref 0.00–0.40)
Total Protein: 6.9 g/dL (ref 6.0–8.5)

## 2018-02-07 LAB — HEPATITIS C ANTIBODY: Hep C Virus Ab: >11 s/co ratio — ABNORMAL HIGH (ref 0.0–0.9)

## 2018-02-07 NOTE — Telephone Encounter (Signed)
Attempted to notify the patient of her positive Hep C screen that will need confirmatory testing at her next visit on 02/13/2018. She did not answer but a HIPPA compliant voicemail was left for her to return the call. Please notify her that she will need to have an additional confirmatory test completed when she returns for a subsequent visit as one of her screening labs was potentially positive.   Lanelle Bal, MD [facility] Internal Medicine, PGY-2

## 2018-02-07 NOTE — Progress Notes (Signed)
Internal Medicine Clinic Attending  I saw and evaluated the patient.  I personally confirmed the key portions of the history and exam documented by Dr. Berline Lopes and I reviewed pertinent patient test results.  The assessment, diagnosis, and plan were formulated together and I agree with the documentation in the resident's note.    I agree with Dr. Berline Lopes, this is a 33 year old female who was recently started on Suboxone, as she is treating the sequela of her heroin addiction.  As noted she was found to have bilateral hand abscesses she has been continued on a prolonged course of Zyvox per infectious disease-she was thought to possibly have endocarditis but TEE did not show any there was some concerned about septic emboli therefore she is completing a 4-week course of Zyvox.  Overall her cravings appear to be well controlled at the 16 mg dose.  She did not have LFTs prior to her coming here or hep C screen.  We have checked these LFTs have come back elevated along with alk phos and hep C is positive we will need to obtain a genotype and repeat labs at the next visit I would also like to obtain a GGT given her elevated alk phos.  I will plan to see her back in 1 week.  Agree with COWS exam as documented by Dr Berline Lopes additional examination reveals a young female in no acute distress bilateral hands are currently wrapped with bandages good capillary refill and movement of all fingers.  Heart is regular rate and rhythm I did not appreciate a murmur lungs are clear to auscultation abdomen is soft and nontender.  Skin does reveal some upper extremity track marks at the antecubital fossa bilaterally no sign of any acute cellulitis or abscess.  Lower extremities are without edema or erythema.

## 2018-02-09 NOTE — Discharge Summary (Addendum)
Physician Discharge Summary  Cheryl SpangleRachel Hogan UJW:[id number] DOB: October 06, 1985 DOA: 01/23/2018  PCP: Patient, [name]  Admit date: 01/23/2018 Discharge date: 01/31/2018  Time spent: 45 minutes  Recommendations for Outpatient Follow-up:  1. Hand surgery Dr. Melvyn Novasrtmann in 7 to 10 days 2. [place] internal medicine Suboxone clinic next Tuesday  3.  Infectious disease clinic on 2/12 at 2:30 PM needs follow-up blood work and x-ray at that time  Discharge Diagnoses:  Principal Problem:   Sepsis (HCC) Active Problems:   Heroin abuse (HCC)   Abscess of right hand   Septic Pulmonary emboli   Cellulitis of left hand   IVDU (intravenous drug user)   Suspected endocarditis prior not current   Discharge Condition: sTable  Diet recommendation: Regular  Filed Weights   01/29/18 0630 01/30/18 0405 01/31/18 0504  Weight: 66.5 kg 67.2 kg 67.8 kg    History of present illness:  33 year old female with history of active IV heroin abuse presented to the ED with swelling abscess overlying right hand dorsal surface and left hand involving index finger.  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 Luciana Axeomer consulted, TEE yesterday negative for any endocarditis now -Even though patient ruled out for acute endocarditis at this time we strongly suspect prior episodes of endocarditis that likely resulted in  septic pulmonary emboli noted this admission -change to PO Abx for hand abscess and septi lung emboli likley from prior IE  Septic Pulmonary emboli/obstructed lingular abscess -d/w ID Dr.Comer, TEE negative for infective endocarditis at this time, however could have had prior episodes from long history of IV heroin abuse -did not have pneumonia -Blood cultures are negative -Plan to treat with oral Zyvox for 4 weeks, she [facility]  Positive beta hCG -Repeat is negative  Heroin abuse -Started on buprenorphine for opiate withdrawal treatment protocol, current currently tolerating 8 mg sublingual twice daily dose, wishes to continue this at discharge to ensure long-term abstinence from heroin -Called [facility] for follow-up, they will attempt to arrange follow-up next week, give patient a short supply of Suboxone at discharge  Tobacco abuse -Nicotine patch  Discharge Exam: Vitals:   01/30/18 2014 01/31/18 0504  BP: 98/80 111/75  Pulse: 93 88  Resp:    Temp: 97.9 F (36.6 C) 97.7 F (36.5 C)  SpO2: 100% 98%    General: AAOx3 Cardiovascular: S1S2/RRR Respiratory: CTAB  Discharge Instructions   Discharge Instructions    Diet Carb Modified   Complete by:  As directed    Increase activity slowly   Complete by:  As directed      Allergies as of 01/31/2018   No Known Allergies     Medication List    TAKE these medications   linezolid 600 MG tablet Commonly known as:  ZYVOX Take 1 tablet (600 mg total) by mouth 2 (two) times daily for 30 days.      No Known Allergies Follow-up Information  Bing NeighborsHarris, Kimberly S, FNP Follow up on 02/21/2018.   Specialty:  Family Medicine Why:  @ 2:50 for hospital follow up. Please call to reschedule if you can not make this scheduled appointment .  Contact information: [address] [place] [address] [phone]        Bradly Bienenstockrtmann, Fred, MD  In 2 days.   Specialty:  Orthopedic Surgery Contact information: [address] [place] [address] [phone]            The results of significant diagnostics from this hospitalization (including imaging, microbiology, ancillary and laboratory) are listed below for reference.    Significant Diagnostic Studies: Dg Chest 2 View  Result Date: 01/30/2018 CLINICAL DATA:  Shortness of breath.  Septic embolism. EXAM: CHEST - 2 VIEW COMPARISON:  CT scan of January 24, 2018. Radiographs of January 23, 2018. FINDINGS: The heart size and mediastinal contours are within normal limits. No pneumothorax or pleural effusion is noted. Right lung is clear. Decreased left upper lobe opacity is noted suggesting improving pneumonia. Continued presence of probable cavitary abnormality seen in left upper lobe. The visualized skeletal structures are unremarkable. IMPRESSION: Improving left upper lobe pneumonia with persistent cavitary abnormality. Electronically Signed   By: Lupita RaiderJames  Green Jr, M.D.   On: 01/30/2018 11:49   Dg Chest 2 View  Result Date: 01/23/2018 CLINICAL DATA:  Chest pain x2 weeks. Patient states she needs detoxification from drugs. Swollen hand with track marks. EXAM: CHEST - 2 VIEW COMPARISON:  None. FINDINGS: Heart size and mediastinal contours are within normal limits. Pulmonary consolidations in the lingula and left upper lobe with air fluid level in the left upper lobe consolidation raises concern for multifocal pneumonia with cavitary component in the left upper lobe. Differential considerations would include pulmonary abscess, less likely septic emboli given the unilateral appearance of this process. No acute osseous abnormality. No effusion or pneumothorax. IMPRESSION: Pulmonary consolidations in the lingula and left upper lobe. Findings raise concern for multilobar pneumonia with cavitary component in the left upper lobe. Differential considerations would include  pulmonary abscess, less likely septic emboli or cavitary mass. Electronically Signed   By: Tollie Ethavid  Kwon M.D.   On: 01/23/2018 21:44   Ct Chest Wo Contrast  Result Date: 01/24/2018 CLINICAL DATA:  Chest pain and cough for 2 weeks. History of intravenous drug abuse. EXAM: CT CHEST WITHOUT CONTRAST TECHNIQUE: Multidetector CT imaging of the chest was performed following the standard protocol without IV contrast. COMPARISON:  Chest radiograph January 23, 2018 FINDINGS: CARDIOVASCULAR: Heart and pericardium are unremarkable. Thoracic aorta is normal course and caliber, unremarkable. MEDIASTINUM/NODES: LEFT > RIGHT axillary lymphadenopathy with pericapsular fat stranding. Limited assessment for mediastinal or hilar lymphadenopathy by noncontrast CT. LUNGS/PLEURA: Proximal tracheobronchial tree is patent, lingular bronchus wall thickening and subsequent obstruction with multifocal collapse. Multifocal consolidation LEFT upper lobe with dominant 6.8 cm air-filled consolidation LEFT upper lobe, apicoposterior segment. Additional patchy ground-glass opacities/subsolid pulmonary nodules LEFT lower lobe, and RIGHT upper and RIGHT lower lobes. No pleural effusion. UPPER ABDOMEN: Nonacute. MUSCULOSKELETAL: Nonacute. Mild degenerative change of the thoracic spine. IMPRESSION: 1. Limited noncontrast CT chest. 2. Multifocal pneumonia with LEFT upper lobe air-containing consolidation seen with atypical infection, abscess and septic emboli. 3. Obstructed lingular bronchus with partial collapse. 4. Reactive LEFT > RIGHT axillary lymphadenopathy. Electronically Signed   By: Awilda Metroourtnay  Bloomer M.D.   On: 01/24/2018 05:32    Microbiology: No results found for this or any previous visit (from the past 240 hour(s)).  Labs: Basic Metabolic Panel: No results for input(s): NA, K, CL, CO2, GLUCOSE, BUN, CREATININE, CALCIUM, MG, PHOS in the last 168 hours. Liver Function Tests: Recent Labs  Lab 02/06/18 1143  AST 83*  ALT 90*   ALKPHOS 175*  BILITOT 0.3  PROT 6.9  ALBUMIN 3.8*   No results for input(s): LIPASE, AMYLASE in the last 168 hours. No results for input(s): AMMONIA in the last 168 hours. CBC: No results for input(s): WBC, NEUTROABS, HGB, HCT, MCV, PLT in the last 168 hours. Cardiac Enzymes: No results for input(s): CKTOTAL, CKMB, CKMBINDEX, TROPONINI in the last 168 hours. BNP: BNP (last 3 results) No results for input(s): BNP in the last 8760 hours.  ProBNP (last 3 results) No results for input(s): PROBNP in the last 8760 hours.  CBG: No results for input(s): GLUCAP in the last 168 hours.     Signed:  Zannie Cove MD.  [facility] 02/09/2018, 1:38 PM

## 2018-02-10 LAB — TOXASSURE SELECT,+ANTIDEPR,UR

## 2018-02-13 ENCOUNTER — Encounter: Payer: Self-pay | Admitting: Internal Medicine

## 2018-02-13 ENCOUNTER — Ambulatory Visit (INDEPENDENT_AMBULATORY_CARE_PROVIDER_SITE_OTHER): Payer: Self-pay | Admitting: Internal Medicine

## 2018-02-13 ENCOUNTER — Other Ambulatory Visit: Payer: Self-pay

## 2018-02-13 VITALS — BP 104/70 | HR 92 | Temp 98.7°F | Wt 157.8 lb

## 2018-02-13 DIAGNOSIS — R768 Other specified abnormal immunological findings in serum: Secondary | ICD-10-CM

## 2018-02-13 DIAGNOSIS — F1120 Opioid dependence, uncomplicated: Secondary | ICD-10-CM

## 2018-02-13 DIAGNOSIS — B182 Chronic viral hepatitis C: Secondary | ICD-10-CM | POA: Insufficient documentation

## 2018-02-13 DIAGNOSIS — Z8619 Personal history of other infectious and parasitic diseases: Secondary | ICD-10-CM | POA: Insufficient documentation

## 2018-02-13 MED ORDER — BUPRENORPHINE HCL-NALOXONE HCL 8-2 MG SL SUBL: 1.0000 | SUBLINGUAL_TABLET | Freq: Two times a day (BID) | SUBLINGUAL | 0 refills | Status: AC

## 2018-02-13 NOTE — Assessment & Plan Note (Addendum)
Screen positive. Patient notified of result and is in agreement with the plan below.  Ordered Hep C quant and genotype Ordered Hep B core Ab, surface Ab, surface Ag. Patient to see [facility] tomorrow, she is likely to reschedule this to which she was advised to complete the appointment ASAP.

## 2018-02-13 NOTE — Progress Notes (Signed)
Internal Medicine Clinic Attending  I saw and evaluated the patient.  I personally confirmed the key portions of the history and exam documented by Dr. Crista Elliot and I reviewed pertinent patient test results.  The assessment, diagnosis, and plan were formulated together and I agree with the documentation in the resident's note.     Doing well on Suboxone therapy.  We will continue Suboxone 8-2 mg twice daily.  She will follow-up with [place] in 2 weeks.  Additionally her hep C antibody screen was positive we will obtain a viral load and genotype as well as screening hep B studies.  She does plan to follow with infectious disease hopefully they can assist [place] with getting patient assistance for her hep C therapy as she is already made a big step in getting treatment for her opioid use disorder.

## 2018-02-13 NOTE — Assessment & Plan Note (Addendum)
A: Continued on therapy, denied acute issues, stated that her cravings are very manageable. She continues to use the prescribed dose and denied use of other drugs prescribed or illicit.   Plan: CMP unremarkable U-tox repeat today Continue Suboxone 16mg  daily Follow-up in two weeks

## 2018-02-13 NOTE — Progress Notes (Signed)
   02/13/2018  Cheryl Hogan presents for follow up of opioid use disorder I have reviewed the prior induction visit, follow up visits, and telephone encounters relevant to opiate use disorder (OUD) treatment.   Current daily dose: 16mg  daily  Date of Induction: 01/23/2018   Current follow up interval, in weeks: Two weeks  The patient has been adherent with the buprenorphine for OUD contract.   Last UDS Result: Positive for Buprenorphine as expected   HPI: Cheryl Hogan is a 33 yo female who presents today for continued treatment of her opioid use disorder. Since induction on 01/23/2018 and subsequent continuation on 02/06/2018 she stated that she has remained compliant with treatment, denied relapse or use of other medications or substances. She sees behavioral health next week. She continues to see her sponsor PRN. She is concerned that her treatment may interfere with her CPS case for her children to which I informed her that to my knowledge it was viewed favorably.   Exam:   Vitals:   02/13/18 1023  BP: 104/70  Pulse: 92  Temp: 98.7 F (37.1 C)  TempSrc: Oral  SpO2: 99%  Weight: 157 lb 12.8 oz (71.6 kg)    General: A/O x4, in no acute distress, afebrile, nondiaphoretic Cardio: RRR, no mrg's Pulmonary: CTA bilaterally MSK: BLE nontender, nonedematous Neuro: Alert, conversational, strength 5/5 in the upper and lower extremities bilaterally, normal gait Psych: Appropriate affect, not depressed in appearance, engages well, slightly anxious, restless but less so than prior week   Assessment/Plan:  See Problem Based Charting in the Encounters Tab   Lanelle Bal, MD  02/13/2018  11:18 AM

## 2018-02-14 ENCOUNTER — Inpatient Hospital Stay: Payer: Self-pay | Admitting: Infectious Diseases

## 2018-02-14 LAB — HEPATITIS B SURFACE ANTIGEN: Hepatitis B Surface Ag: NEGATIVE

## 2018-02-14 LAB — HEPATITIS B SURFACE ANTIBODY,QUALITATIVE: Hep B Surface Ab, Qual: NONREACTIVE

## 2018-02-14 LAB — HEPATITIS B CORE ANTIBODY, TOTAL: Hep B Core Total Ab: NEGATIVE

## 2018-02-16 LAB — HCV RNA QUANT
HCV log10: 3.943 log10 IU/mL
Hepatitis C Quantitation: 8760 IU/mL

## 2018-02-16 LAB — HEPATITIS C GENOTYPE: Hepatitis C Genotype: 3

## 2018-02-17 LAB — TOXASSURE SELECT,+ANTIDEPR,UR

## 2018-02-21 ENCOUNTER — Inpatient Hospital Stay: Payer: Self-pay | Admitting: Family Medicine

## 2018-02-22 ENCOUNTER — Inpatient Hospital Stay: Payer: Self-pay | Admitting: Family

## 2018-02-27 ENCOUNTER — Inpatient Hospital Stay: Payer: Self-pay | Admitting: Family

## 2018-02-27 MED FILL — BUPRENORPHIN-NALOXON 8-2 MG: 8-2 | 14 days supply | Qty: 28 | Fill #0

## 2018-03-06 ENCOUNTER — Ambulatory Visit (INDEPENDENT_AMBULATORY_CARE_PROVIDER_SITE_OTHER): Payer: Self-pay | Admitting: Internal Medicine

## 2018-03-06 ENCOUNTER — Ambulatory Visit: Payer: Self-pay | Admitting: Internal Medicine

## 2018-03-06 ENCOUNTER — Other Ambulatory Visit: Payer: Self-pay

## 2018-03-06 VITALS — BP 106/66 | HR 85 | Temp 98.1°F | Wt 168.1 lb

## 2018-03-06 DIAGNOSIS — D649 Anemia, unspecified: Secondary | ICD-10-CM | POA: Insufficient documentation

## 2018-03-06 DIAGNOSIS — F1120 Opioid dependence, uncomplicated: Secondary | ICD-10-CM

## 2018-03-06 DIAGNOSIS — Z653 Problems related to other legal circumstances: Secondary | ICD-10-CM

## 2018-03-06 DIAGNOSIS — B182 Chronic viral hepatitis C: Secondary | ICD-10-CM

## 2018-03-06 DIAGNOSIS — F17210 Nicotine dependence, cigarettes, uncomplicated: Secondary | ICD-10-CM | POA: Insufficient documentation

## 2018-03-06 MED ORDER — BUPRENORPHINE HCL-NALOXONE HCL 8-2 MG SL SUBL: 1.0000 | SUBLINGUAL_TABLET | Freq: Every day | SUBLINGUAL | 0 refills | Status: DC

## 2018-03-07 ENCOUNTER — Encounter: Payer: Self-pay | Admitting: Family Medicine

## 2018-03-07 ENCOUNTER — Encounter: Payer: Self-pay | Admitting: Internal Medicine

## 2018-03-07 MED ORDER — BUPRENORPHINE HCL-NALOXONE HCL 8-2 MG SL SUBL: 1.0000 | SUBLINGUAL_TABLET | Freq: Two times a day (BID) | SUBLINGUAL | 0 refills | Status: DC

## 2018-03-07 NOTE — Assessment & Plan Note (Signed)
Recheck U tox.  Continue Suboxone 16 mg total daily dose. Plan for follow-up in 2 weeks, he is interested in establishing care with our clinic for her primary care I think this is reasonable.  I will have her seen in [facility] at next visit to establish care in roughly 2 weeks.

## 2018-03-07 NOTE — Progress Notes (Signed)
   03/07/2018  [name] presents for follow up of opioid use disorder I have reviewed the prior induction visit, follow up visits, and telephone encounters relevant to opiate use disorder (OUD) treatment.   Current daily dose: suboxone 16mg  daily  Date of Induction: 01/23/2018   Current follow up interval, in weeks: Two weeks  The patient has been adherent with the buprenorphine for OUD contract.   Last UDS Result: Positive for Buprenorphine as expected   HPI: Cheryl Hogan is a 33 yo female who presents today for continued treatment of her opioid use disorder.  Cheryl Hogan is done very well on Suboxone.  Unfortunately she had to cancel her visit last week, also did not have money for Rx and had to delay picking up Rx she decreased her dose of Suboxone to try to make her prescription last.  She denies any illicit opioid use.  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  However this has led to some irregular training hours and made appointments difficult.  She had plan to establish with a primary care physician however she has had trouble keeping the appointment she is also due to see infectious disease he had to reschedule her last appointment.  She reports to me that she does have some ongoing legal issues she is working to regain custody of her child who is currently in foster care.     Exam:   Vitals:   03/06/18 1052  BP: 106/66  Pulse: 85  Temp: 98.1 F (36.7 C)  TempSrc: Oral  SpO2: 99%  Weight: 168 lb 1.6 oz (76.2 kg)    General: A/O x4, in no acute distress Cardio: RRR, no mrg's Pulmonary: CTA bilaterally MSK: Bilateral hands well-healing no new track marks.  Sutures have been removed.  Good grip strength slightly decreased for left index finger, no warmth or erythema. Neuro: Alert, conversational, strength 5/5 in the upper and lower extremities bilaterally, Psych: Appropriate affect   Assessment/Plan:  See Problem Based  Charting in the Encounters Tab   Gust Rung, DO  03/07/2018  1:51 PM

## 2018-03-07 NOTE — Assessment & Plan Note (Signed)
Has missed follow-ups with infectious disease due to scheduling conflicts and lack of transportation.

## 2018-03-09 ENCOUNTER — Inpatient Hospital Stay: Payer: Self-pay | Admitting: Family Medicine

## 2018-03-10 LAB — TOXASSURE SELECT,+ANTIDEPR,UR

## 2018-03-20 ENCOUNTER — Telehealth: Payer: Self-pay | Admitting: *Deleted

## 2018-03-20 NOTE — Telephone Encounter (Addendum)
Patient called in thinking she had an OUD appt today but then remembered she didn't make one. She states she is supposed to be seen every 2 weeks and doesn't want to be kicked out of the program. Explained that we are not making appts at this time due to covid restrictions but are managing meds via televisits with OUD Attending. Patient has a 2 week supply of suboxone that was sent to [facility] pharmacy on 3/10. She has not picked that Rx up yet but will do so today. She will call back in 2 weeks. Placed on OUD schedule for 04/03/2018 as reminder. Kinnie Feil, RN, BSN

## 2018-03-26 ENCOUNTER — Telehealth: Payer: Self-pay | Admitting: Internal Medicine

## 2018-03-26 ENCOUNTER — Other Ambulatory Visit: Payer: Self-pay | Admitting: Internal Medicine

## 2018-03-26 MED ORDER — TRIAMCINOLONE ACETONIDE 0.1 % EX OINT: 1.0000 "application " | TOPICAL_OINTMENT | Freq: Two times a day (BID) | CUTANEOUS | 1 refills | Status: DC

## 2018-03-26 MED ORDER — BUPRENORPHINE HCL-NALOXONE HCL 8-2 MG SL SUBL: 1.0000 | SUBLINGUAL_TABLET | Freq: Two times a day (BID) | SUBLINGUAL | 1 refills | Status: AC

## 2018-03-26 MED FILL — BUPRENORPHIN-NALOXON 8-2 MG: 8-2 | 14 days supply | Qty: 28 | Fill #0

## 2018-03-26 NOTE — Telephone Encounter (Signed)
Spoke w/ pt and pharm, she will pick up wed at [facility].

## 2018-03-26 NOTE — Telephone Encounter (Signed)
Spoke with Cheryl Hogan, doing well with suboxone- no relapse, still able to work at [employer].  Discussed we are deferring follow up appointments for OUD clinic due to COVID right now, I am sending in a 4 week Rx of suboxone to [facility] outpatient pharmacy.   She also noted she was not able to schedule a visit to our clinic yet because of COVID to establish care, she has psorasis and it has flaired. She wanted a steroid cream for it, cannot remember what cream she has had in the past.  I will send in kenalog 0.1% ointment to use BID, discussed not for use on face.  She will call back with any issues.

## 2018-03-26 NOTE — Telephone Encounter (Signed)
Refill Request Please call pt   buprenorphine-naloxone (SUBOXONE) 8-2 mg SUBL SL tablet

## 2018-03-30 ENCOUNTER — Telehealth: Payer: Self-pay | Admitting: General Practice

## 2018-03-30 NOTE — Telephone Encounter (Signed)
rtc to pt, she was concerned because she got a reminder call for appt 3/31 and wanted to confirm that her appt is cancelled, informed her I would re confirm w/ dr Mikey Bussing

## 2018-03-30 NOTE — Telephone Encounter (Signed)
Her appointment is cancelled, the reminder call was a mistake

## 2018-03-30 NOTE — Telephone Encounter (Signed)
Pls contact [phone]

## 2018-04-27 MED FILL — BUPRENORPHIN-NALOXON 8-2 MG: 8-2 | 14 days supply | Qty: 28 | Fill #1

## 2018-05-03 ENCOUNTER — Telehealth: Payer: Self-pay | Admitting: *Deleted

## 2018-05-03 NOTE — Telephone Encounter (Signed)
Have attempted to reach pt via both ph# listed, message states they cannot be completed, may have been disconnected. Will try again tomorrow

## 2018-05-18 ENCOUNTER — Telehealth: Payer: Self-pay | Admitting: General Practice

## 2018-05-18 MED ORDER — BUPRENORPHINE HCL-NALOXONE HCL 8-2 MG SL SUBL: 1.0000 | SUBLINGUAL_TABLET | Freq: Two times a day (BID) | SUBLINGUAL | 1 refills | Status: DC

## 2018-05-18 MED FILL — BUPRENORPHINE HCL-NALOXONE: 8-2 | 14 days supply | Qty: 28 | Fill #0

## 2018-05-18 NOTE — Telephone Encounter (Signed)
Needs refill on suboxene [facility] [place], [place] - [address] [place] ;pt contact [phone]

## 2018-05-18 NOTE — Telephone Encounter (Signed)
No answer, no VM to my return call. It looks like she has been doing well per chart review. The PMP review is appropriate. I will go ahead and approve another one month supply to [facility]. I am not sure why the suboxone fell off med list, but I added it back. Hopefully we can schedule an inperson follow up in one month to eval inperson for next refill.

## 2018-05-21 ENCOUNTER — Telehealth: Payer: Self-pay

## 2018-05-21 NOTE — Telephone Encounter (Signed)
Patient made aware that refill sent to [facility] on 05/18/2018. Confirmed appt for 6/6 at 1115. Patient very appreciative. Kinnie Feil, RN, BSN

## 2018-05-21 NOTE — Telephone Encounter (Signed)
buprenorphine-naloxone (SUBOXONE) 8-2 mg SUBL SL tablet, refill request @ [facility].

## 2018-06-14 ENCOUNTER — Ambulatory Visit (INDEPENDENT_AMBULATORY_CARE_PROVIDER_SITE_OTHER): Payer: Self-pay | Admitting: Student in an Organized Health Care Education/Training Program

## 2018-06-14 ENCOUNTER — Telehealth: Payer: Self-pay | Admitting: *Deleted

## 2018-06-14 ENCOUNTER — Other Ambulatory Visit: Payer: Self-pay

## 2018-06-14 DIAGNOSIS — F1120 Opioid dependence, uncomplicated: Secondary | ICD-10-CM

## 2018-06-14 MED ORDER — BUPRENORPHINE HCL-NALOXONE HCL 8-2 MG SL SUBL: 1.0000 | SUBLINGUAL_TABLET | Freq: Two times a day (BID) | SUBLINGUAL | 1 refills | Status: DC

## 2018-06-14 NOTE — Telephone Encounter (Signed)
Dr Evette Doffing you may call pt at [phone], Cheryl Hogan has checked her in

## 2018-06-14 NOTE — Progress Notes (Signed)
   This is a telephone encounter between Cheryl Hogan and Axel Filler on 06/14/2018 for opioid use disorder. The visit was conducted with the patient located at home and Axel Filler at [facility]. The patient's identity was confirmed using their DOB and current address. The patient has consented to being evaluated through a telephone encounter and understands the associated risks/benefits. I personally spent 12 on medical discussion.    Assessment and Plan:  See Encounters tab for problem-based medical decision making.   __________________________________________________________  HPI:   33 year old person here for follow-up with opioid use disorder.  Patient missed our appointment on Tuesday, says that she thought the appointment was today instead.  Sounds like there was some confusion.  Instead we are going to conduct a disappointments over the telephone.  Patient reports doing very well on Suboxone 2 tablets daily.  She has been on treatment since January reports her last drug use was on January 23.  Reports having her cravings well controlled, denies any withdrawal.  Denies any recent drug use.  Reports engaging in counseling services through [facility] [place].  Says she does telemetry counseling visits with them, pays on a sliding scale basis.  They have not started in person visits or group visits yet because of COVID-19 restrictions.  She works a full-time job Water engineer, lives in [place] with her husband, reports a safe living situation.  __________________________________________________________  Problem List: Patient Active Problem List   Diagnosis Date Noted  . Cigarette smoker 03/06/2018  . Normocytic anemia 03/06/2018  . Chronic hepatitis C ([place]) 02/13/2018  . Opioid use disorder, severe, dependence ([place]) 02/06/2018  . Anxiety and depression 02/06/2018    Medications: Reconciled today in Epic

## 2018-06-14 NOTE — Assessment & Plan Note (Signed)
Per report stable, seems to be doing well.  Plan is to continue with Suboxone 8 mg generic tablet, 2/day.  I sent a 2-week supply with 1 refill to the [facility] outpatient pharmacy.  She pays $25 cash which is affordable for her.  Will need to see her in person for next visits, would like more consistency as she has had a few missed visits with [place] in the past.  Next visit scheduled for July 7. Will plan for tox screen then.

## 2018-06-14 NOTE — Patient Instructions (Signed)
Tele visit

## 2018-07-05 MED FILL — BUPRENORPHIN-NALOXON 8-2 MG: 8-2 | 14 days supply | Qty: 28 | Fill #1

## 2018-07-10 ENCOUNTER — Other Ambulatory Visit: Payer: Self-pay

## 2018-07-10 ENCOUNTER — Ambulatory Visit (INDEPENDENT_AMBULATORY_CARE_PROVIDER_SITE_OTHER): Payer: Self-pay | Admitting: Internal Medicine

## 2018-07-10 VITALS — BP 116/104 | HR 85 | Temp 97.8°F | Wt 195.6 lb

## 2018-07-10 DIAGNOSIS — F1120 Opioid dependence, uncomplicated: Secondary | ICD-10-CM

## 2018-07-10 DIAGNOSIS — B182 Chronic viral hepatitis C: Secondary | ICD-10-CM

## 2018-07-10 MED ORDER — BUPRENORPHINE HCL-NALOXONE HCL 8-2 MG SL SUBL: 1.0000 | SUBLINGUAL_TABLET | Freq: Two times a day (BID) | SUBLINGUAL | 1 refills | Status: DC

## 2018-07-10 NOTE — Patient Instructions (Addendum)
Fritzi, It was nice meeting you! You are doing well on Suboxone dosing which we will continue.    I will try to schedule you for a primary care appointment the same morning as your Suboxone clinic visit for convenience.   Be sure to call and schedule an appointment with the infectious disease clinic to treat your Hep C.   We'll see you back in 4 weeks!   Take care,  Dr. Koleen Distance

## 2018-07-10 NOTE — Assessment & Plan Note (Addendum)
Stable. She has done very well on Suboxone 8 mg BID. Cravings well controlled. No adverse side effects. PDMP reviewed. Appears she got slightly off schedule but no other red flags. Will continue current dosing and follow-up in 4 weeks. Tox screen done today.

## 2018-07-10 NOTE — Addendum Note (Signed)
Addended by: Lalla Brothers T on: 07/10/2018 02:12 PM   Modules accepted: Level of Service

## 2018-07-10 NOTE — Progress Notes (Signed)
   07/10/2018  Cheryl Hogan presents for follow up of opioid use disorder I have reviewed the prior induction visit, follow up visits, and telephone encounters relevant to opiate use disorder (OUD) treatment.   Current daily dose: Suboxone 8 mg BID   Date of Induction: 01/23/2018  Current follow up interval, in weeks: 4 weeks  The patient has been adherent with the buprenorphine for OUD contract.   Last UDS Result: appropriate for Suboxone   HPI: Cheryl Hogan is a 64 y 14 female who presents for continued treatment of her opioid use disorder. She continues to well on Suboxone and is taking consistently twice daily. Controls cravings well. She has not used any illicit opioids since induction in January. Patient is doing well with her counselor and plans so start group visits soon. It was also recommended that she undergo a formal psychiatric evaluation. She has never been on any psychiatric medications in the past.  She notes she frequently feels tired, but denies any depressed mood.  Her work scheduled has made if difficult for her make appointments, but plans to establish with PCP here and make an appointment with infectious disease for chronic hep C.   Exam:   Vitals:   07/10/18 0956  BP: (!) 116/104  Pulse: 85  Resp: (!) 100  Temp: 97.8 F (36.6 C)  TempSrc: Oral  Weight: 195 lb 9.6 oz (88.7 kg)    General: pleasant female in NAD  Cardio: RRR; no m/r/g Pulm: normal work of breathing; lungs CTAB Psych: appropriate mood and affect   Assessment/Plan:  See Problem Based Charting in the Encounters Tab    Modena Nunnery D, DO  07/10/2018  10:02 AM

## 2018-07-10 NOTE — Assessment & Plan Note (Signed)
Had several conflicts between [place] and her work schedule, but plans on making an appointment with ID in the next few weeks.

## 2018-07-10 NOTE — Progress Notes (Signed)
Internal Medicine Clinic Attending  I saw and evaluated the patient.  I personally confirmed the key portions of the history and exam documented by Dr. Bloomfield and I reviewed pertinent patient test results.  The assessment, diagnosis, and plan were formulated together and I agree with the documentation in the resident's note.  

## 2018-07-19 LAB — TOXASSURE SELECT,+ANTIDEPR,UR

## 2018-08-14 ENCOUNTER — Ambulatory Visit (INDEPENDENT_AMBULATORY_CARE_PROVIDER_SITE_OTHER): Payer: Self-pay | Admitting: Student in an Organized Health Care Education/Training Program

## 2018-08-14 ENCOUNTER — Other Ambulatory Visit: Payer: Self-pay

## 2018-08-14 VITALS — BP 100/57 | HR 95 | Temp 99.2°F | Ht 69.0 in | Wt 198.9 lb

## 2018-08-14 DIAGNOSIS — F1120 Opioid dependence, uncomplicated: Secondary | ICD-10-CM

## 2018-08-14 MED ORDER — BUPRENORPHINE HCL-NALOXONE HCL 8-2 MG SL SUBL: 1.0000 | SUBLINGUAL_TABLET | Freq: Two times a day (BID) | SUBLINGUAL | 1 refills | Status: DC

## 2018-08-14 MED FILL — BUPRENORPHIN-NALOXON 8-2 MG: 8-2 | 14 days supply | Qty: 28 | Fill #0

## 2018-08-14 NOTE — Progress Notes (Signed)
   08/14/2018  [name] presents for follow up of opioid use disorder I have reviewed the prior induction visit, follow up visits, and telephone encounters relevant to opiate use disorder (OUD) treatment.   Current daily dose: Suboxone 8 mg twice daily  Date of Induction: 01/23/18  Current follow up interval, in weeks: 4 weeks  The patient has been adherent with the buprenorphine for OUD contract.   Last UDS Result: Appropriate   HPI: 33 year old person here for follow-up of opioid use disorder.  Reports doing well at home.  Reports good compliance with Suboxone.  Denies adverse side effects.  Reports well-controlled cravings.  Denies relapses.  Having some stress right now related to work.  She works at [employer], says a Mudlogger has COVID-19.  She denies any fevers or chills.  She reports a chronic smoker's cough.  Says she does not feel sick otherwise.  Still going to work, continues to struggle with finances.  She is uninsured and reports that insurance does not offer through her work.  Exam:   Vitals:   08/14/18 0929  BP: (!) 100/57  Pulse: 95  Temp: 99.2 F (37.3 C)  TempSrc: Oral  SpO2: 99%  Weight: 198 lb 14.4 oz (90.2 kg)  Height: 5\' 9"  (1.753 m)    General: Well-appearing, no distress Lungs: Unlabored, clear throughout with no crackles Neuro: Alert, conversational, normal strength throughout, normal gait Psych: Appropriate, not depressed or anxious appearing  Assessment/Plan:  See Problem Based Charting in the Encounters Tab     Axel Filler, MD  08/14/2018  10:09 AM

## 2018-08-14 NOTE — Assessment & Plan Note (Signed)
OUD is stable.  Cravings well controlled.  Reports no relapses.  Plan to check urine tox assure today.  Continue with generic Suboxone 8 mg twice daily.  We talked about the ending of the outpatient pharmacy assistance program which is allowing her to afford the Suboxone therapy right now.  Unfortunately she does not think she will be able to afford it at other pharmacies.  She is not eligible for health insurance through her employer.  She does not think she is eligible for Medicaid.  I asked her to do some research and see if she can find a source of health insurance with her current circumstances.  Also asked her to look for coupons.  We will readdress in 1 month to see if she can continue on with treatment or if it will become unaffordable.

## 2018-08-16 LAB — TOXASSURE SELECT,+ANTIDEPR,UR

## 2018-08-27 ENCOUNTER — Telehealth: Payer: Self-pay | Admitting: *Deleted

## 2018-08-27 NOTE — Telephone Encounter (Signed)
Cheryl Hogan, pt wants to establish with you, could you call her at [place], pt needs a pcp

## 2018-09-07 MED FILL — BUPRENORPHIN-NALOXON 8-2 MG: 8-2 | 14 days supply | Qty: 28 | Fill #0

## 2018-09-18 ENCOUNTER — Other Ambulatory Visit: Payer: Self-pay | Admitting: *Deleted

## 2018-09-19 MED ORDER — BUPRENORPHINE HCL-NALOXONE HCL 8-2 MG SL SUBL: 1.0000 | SUBLINGUAL_TABLET | Freq: Two times a day (BID) | SUBLINGUAL | 1 refills | Status: DC

## 2018-09-19 MED FILL — BUPRENORPHIN-NALOXON 8-2 MG: 8-2 | 14 days supply | Qty: 28 | Fill #0

## 2018-09-24 ENCOUNTER — Ambulatory Visit (INDEPENDENT_AMBULATORY_CARE_PROVIDER_SITE_OTHER): Payer: Self-pay | Admitting: Infectious Diseases

## 2018-09-24 ENCOUNTER — Encounter: Payer: Self-pay | Admitting: Infectious Diseases

## 2018-09-24 ENCOUNTER — Other Ambulatory Visit: Payer: Self-pay

## 2018-09-24 ENCOUNTER — Telehealth: Payer: Self-pay | Admitting: Pharmacy Technician

## 2018-09-24 VITALS — BP 110/74 | HR 98

## 2018-09-24 DIAGNOSIS — Z598 Other problems related to housing and economic circumstances: Secondary | ICD-10-CM

## 2018-09-24 DIAGNOSIS — F1120 Opioid dependence, uncomplicated: Secondary | ICD-10-CM | POA: Insufficient documentation

## 2018-09-24 DIAGNOSIS — Z3009 Encounter for other general counseling and advice on contraception: Secondary | ICD-10-CM | POA: Insufficient documentation

## 2018-09-24 DIAGNOSIS — B182 Chronic viral hepatitis C: Secondary | ICD-10-CM

## 2018-09-24 DIAGNOSIS — Z0000 Encounter for general adult medical examination without abnormal findings: Secondary | ICD-10-CM

## 2018-09-24 DIAGNOSIS — Z5989 Other problems related to housing and economic circumstances: Secondary | ICD-10-CM | POA: Insufficient documentation

## 2018-09-24 MED ORDER — MAVYRET 100-40 MG PO TABS: 1.0000 | ORAL_TABLET | Freq: Every day | ORAL | 1 refills | Status: DC

## 2018-09-24 NOTE — Progress Notes (Signed)
Patient Name: Cheryl Hogan  Date of Birth: October 05, 1985  MRN: [id number]  PCP: Bridget Hartshorn, DO  Referring Provider: Bridget Hartshorn, DO, Ph#: [phone]   Patient Active Problem List   Diagnosis Date Noted  . Healthcare maintenance 09/24/2018  . Underinsured 09/24/2018  . Opioid dependence on agonist therapy (HCC)   . Cigarette smoker 03/06/2018  . Normocytic anemia 03/06/2018  . Chronic hepatitis C (HCC) 02/13/2018  . Opioid use disorder, severe, dependence (HCC) 02/06/2018  . Anxiety and depression 02/06/2018    CC:  New patient - initial evaluation and management of chronic hepatitis C infection.   HPI/ROS:  Cheryl Hogan is a 33 y.o. female. Past medical history includes normocytic anemia, anxiety and depression, cigarette smoking.   Patient tested positive for hepatitis C during hospitalization back in February of this year. To the best of her knowledge she had never been tested prior to that. Previous Hepatitis C-associated risk factors are IV drug abuse, which she has been clean from since January 2020 with use of Suboxone managed by Internal Medicine. Patient lab work in February showed a positive Hepatitis C antibody with a viral load of 8,760 and genotype 3. She has not had prior treatment for Hepatitis C. She does not have a personal or familial history of liver disease and does not have any associated signs or symptoms related to liver disease, although she reports "sometimes doesn't feel well." Labs reviewed and confirm chronic hepatitis C with a positive viral load.    Additionally, patient does not have documented immunity to Hepatitis A. Patient does not have documented immunity to Hepatitis B. Recently had negative HbsAg and HIV antibody.    Review of Systems  Constitutional: Positive for malaise/fatigue. Negative for chills and fever.  Eyes:       No scleral icterus  Respiratory: Negative.   Cardiovascular: Negative for leg swelling.   Gastrointestinal: Negative for abdominal pain, diarrhea, nausea and vomiting.  Musculoskeletal: Negative for myalgias.  Skin: Negative for itching and rash.  Neurological: Negative for dizziness, tremors and headaches.  Psychiatric/Behavioral: Negative for depression. The patient is not nervous/anxious.     All other systems reviewed and are negative      Past Medical History:  Diagnosis Date  . Abscess of right hand 01/24/2018  . ADD (attention deficit disorder)   . Anxiety   . Cellulitis of left hand   . Depression   . Injection of illicit drug within last 12 months   . Opioid dependence on agonist therapy (HCC)   . Psoriasis   . Pulmonary abscess (HCC) 01/24/2018    Prior to Admission medications   Medication Sig Start Date End Date Taking? Authorizing Provider  buprenorphine-naloxone (SUBOXONE) 8-2 mg SUBL SL tablet Place 1 tablet under the tongue 2 (two) times daily. 09/19/18  Yes Tyson Alias, MD  triamcinolone ointment (KENALOG) 0.1 % Apply 1 application topically 2 (two) times daily. 03/26/18  Yes Gust Rung, DO    No Known Allergies  Social History   Tobacco Use  . Smoking status: Current Every Day Smoker    Packs/day: 0.40    Types: Cigarettes  . Smokeless tobacco: Never Used  . Tobacco comment: .5 PPD  Substance Use Topics  . Alcohol use: Not Currently  . Drug use: Not Currently    Comment: Last used IV heroin January 2020    Family History  Problem Relation Age of Onset  . Liver cancer Neg Hx   . Liver disease  Neg Hx     Objective:   Vitals:   09/24/18 0910  BP: 110/74  Pulse: 98   Physical Exam Constitutional:      Appearance: Normal appearance.  HENT:     Head: Normocephalic.  Cardiovascular:     Rate and Rhythm: Normal rate and regular rhythm.     Heart sounds: Normal heart sounds.  Pulmonary:     Effort: Pulmonary effort is normal.  Abdominal:     General: Abdomen is flat.     Palpations: Abdomen is soft.  Skin:     General: Skin is warm and dry.     Coloration: Skin is not jaundiced.  Neurological:     General: No focal deficit present.     Mental Status: She is alert and oriented to person, place, and time.  Psychiatric:        Mood and Affect: Mood normal.        Behavior: Behavior normal.        Thought Content: Thought content normal.        Judgment: Judgment normal.    Laboratory: Genotype: 3 HCV viral load: 8,760 Lab Results  Component Value Date   WBC 13.0 (H) 01/26/2018   HGB 9.7 (L) 01/26/2018   HCT 29.6 (L) 01/26/2018   MCV 83.4 01/26/2018   PLT 663 (H) 01/26/2018    Lab Results  Component Value Date   CREATININE 0.57 01/31/2018   BUN 21 (H) 01/31/2018   NA 135 01/31/2018   K 4.5 01/31/2018   CL 101 01/31/2018   CO2 24 01/31/2018    Lab Results  Component Value Date   ALT 90 (H) 02/06/2018   AST 83 (H) 02/06/2018   ALKPHOS 175 (H) 02/06/2018    Lab Results  Component Value Date   BILITOT 0.3 02/06/2018   ALBUMIN 3.8 (L) 02/06/2018    Assessment & Plan:   Problem List Items Addressed This Visit      Unprioritized   Chronic hepatitis C ([place]) - Primary (Chronic)    Porfiria had positive hepatitis C antibody with viral load of 8,760 back in February of this year. Previously was an IV drug user, however has been sober since January on Suboxone managed by Internal Medicine. She reports she is doing well on this regimen. I counseled her on hepatitis C and long term effects without treatment. We discussed treatment options as well. Plan is to repeat hepatitis C RNA level today to ensure she has not independently cleared the virus on her own given low level back in 02/2018. If viral load remains present, will initiate Mavyret for 8 weeks. Other lab work to be completed includes CBC, CMET, LFT's, Fibrotest, and PT INR.       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 She was counseled on hepatitis B and agreed to vaccine series and was given the the first vaccine today.       Opioid dependence on agonist therapy ([place])    Well maintained on suboxone as treated by Internal Medicine Clinic. Greatly appreciate their care.       Underinsured    Will give her applications for [employer] and [employer] assistance  programs today.  Mavyret patient assistance signed.          I spent 45 minutes with the patient including greater than 70% of time in face to face counsel of the patient re hepatitis c and the details described above and in coordination of their care.  Rexene AlbertsStephanie Dixon, MSN, NP-C [facility]  [email or web address] Pager: [phone] Office: [phone] [facility] Main Line: [phone]

## 2018-09-24 NOTE — Assessment & Plan Note (Addendum)
She does not have immunity to hepatitis B based on labs from February. She was counseled on hepatitis B and agreed to vaccine series and was given the the first vaccine today.

## 2018-09-24 NOTE — Telephone Encounter (Addendum)
[facility] Patient Advocate Encounter    Findings of the benefits investigation conducted this morning via test claims for the patient's upcoming appointment on 09/21 are as follows:   Insurance: uninsured  Patient has completed and signed both applications for patient assistance. Will more than likely be going with [employer] assist for [place]. An income verification letter has been completed to accompany the application. She would like her medication shipped to the clinic due to inconsistencies with apartment complex mail system.  Bartholomew Crews, CPhT Specialty Pharmacy Patient [facility] for Infectious Disease Phone: [phone] Fax: [phone] 09/24/2018 8:45 AM

## 2018-09-24 NOTE — Patient Instructions (Addendum)
Nice to meet you today!    We need to get a little more information about your hepatitis c infection before we start your treatment. I anticipate that we can get you started in a few weeks after we submit approval to your insurance to ensure payment. We may need to place referral for an ultrasound and/or gastroenterology if your blood work indicates more damage to the liver than expected.     ABOUT HEPATITIS C VIRUS:   Chronic Hepatitis C is the most common blood-borne infection in the [place], affecting approximately 3 million people.   It is the leading cause of cirrhosis, liver cancer, and end stage liver disease requiring transplantation when this infection goes untreated for many years   The majority of people who are infected are unaware because there are not many early symptoms that are specific to this and often go undiagnosed until a specific blood test is drawn.    The hepatitis c virus is passed primarily through direct exposure of contaminated blood or body fluids. It is most efficiently transmitted through repeated exposure to infected blood.   Risk for sexual transmission is very low but is possible if there is high frequency of unprotected sexual activity with known hepatitis c partner or multiple partners of known status.   Over time, approximately 60-70% of people can develop some degree of liver disease. Cirrhosis occurs in 10-20% of those with chronic infection. 1-5% will get liver cancer, which has a very high rate of death.    Approximately 15-25% clear the infection without medication (usually in the first 6 months of becoming exposed to virus)   Newer medications provide over 95% cure rate when taken as prescribed    IN GENERAL ABOUT DIET  . Persons living with chronic hepatitis c infection should eat a diet to maintain a healthy weight and avoid nutritional deficiencies.   . Completely avoiding alcohol is the best decision for your liver health. If  unable to do so please limit alcohol to as little as possible to less than 1 standard drink a day - this is very irritating to your liver.  . Limit tylenol use to less than 2,000 mg daily (two extra strength tablets only twice a day)  . If you have cirrhosis of the liver please take no more than 1,000 mg tylenol a day  . Patients with cirrhosis should not have protein restriction; we recommend a protein intake of approximately 1.2-1.5 g/kg/day.   . For patients with cirrhosis and hepatic encephalopathy, the American Association for the Study of Liver Diseases (AASLD) recommended protein intake is 1.2-1.5 g/kg/day.  . If you experience ascites (fluid accumulation in the abdomen associated with severe liver damage / cirrhosis) please limit sodium intake to < 2000 mg a day    UNTIL YOU HAVE BEEN TREATED AND CURED:  . Use condoms with all sexual encounters or practice abstinence to avoid sexual transmission   . No sharing of razors, toothbrushes, nail clippers or anything that could potentially have blood on it.   . If you cut yourself please clean and cover any wounds or open sores to others do not come into contact with your blood.   . If blood spills onto item/surface please clean with 1:10 bleach solution and allow to dry, EVEN if it is dried blood.    GENERAL HELPFUL HINTS ON HCV THERAPY:  1. Stay well-hydrated.  2. Notify the ID Clinic of any changes in your other over-the-counter/herbal or prescription medications.    3. If you miss a dose of your medication, take the missed dose as soon as you remember. Return to your regular time/dose schedule the next day.   4.  Do not stop taking your medications without first talking with your healthcare provider.  5.  You will see our pharmacist-specialist within the first 2 weeks of starting your medication to monitor for any possible side effects.  6.  You will have blood work once during treatment 4 weeks after your first pill. Again  soon after treatment is completed and one final lab 3 months after your last pill to ensure cure!   TIPS TO BE SUCCESSFUL WITH DAILY MEDICATION USE:  1. Set a reminder on your phone  2. Try filling out a pill box for the week - pick a day and put one pill for every day during the week so you know right away if you missed a pill.   3. Have a trusted family member ask you about your medications.   4. Smartphone app    Medication we would like to use for you will be : Solicitor Instructions:  1. Take Mavyret, three tablets (at the same time) daily with food. Please take ALL THREE PILLS AT ONCE. You should take it at approximately the same time every day. Treatment will be for 8 weeks. Do not miss a dose.    2. Do not run out of [place]! If you are down to one week of medication left and have not heard about your next shipment, please let us know as soon as possible. You will be given 28 days of treatment at a time and will receive one refill.   3. If you need to start a new medication, prescription from your doctor or over the counter medication, you need to contact us to make sure it does not interfere with [place]. There are several medications that can interfere with Mavyret and can make you sick or make the medication not work.  4. If you need to take a medication for acid reflux, you can take omeprazole 20mg  daily.     5. Tylenol (acetominophen) and Advil (ibuprofen) are safe to take with [place] if needed for headache, fever, pain.   6. IF YOU ARE ON BIRTH CONTROL PILLS, YOU RECEIVE A SHOT FOR BIRTH CONTROL, OR YOU HAVE AN IUD, please notify your provider to make sure it is safe with Mavyret.   7. DO NOT stop Mavyret unless instructed 8. to by your provider. If you are hospitalized while taking this medication please bring it with you to the hospital to avoid interruption of therapy. Every pill is important!  9. The most common side effects associated with Mavyret include:   o Fatigue o Headache o Nausea o Diarrhea o Insomnia

## 2018-09-24 NOTE — Assessment & Plan Note (Signed)
Well maintained on suboxone as treated by [facility] Medicine Clinic. Greatly appreciate their care.

## 2018-09-24 NOTE — Assessment & Plan Note (Signed)
Cheryl Hogan had positive hepatitis C antibody with viral load of 8,760 back in February of this year. Previously was an IV drug user, however has been sober since January on Suboxone managed by Internal Medicine. She reports she is doing well on this regimen. I counseled her on hepatitis C and long term effects without treatment. We discussed treatment options as well. Plan is to repeat hepatitis C RNA level today to ensure she has not independently cleared the virus on her own given low level back in 02/2018. If viral load remains present, will initiate Mavyret for 8 weeks. Other lab work to be completed includes CBC, CMET, LFT's, Fibrotest, and PT INR.

## 2018-09-24 NOTE — Addendum Note (Signed)
Addended by: Reggy Eye on: 09/24/2018 02:15 PM   Modules accepted: Orders

## 2018-09-24 NOTE — Assessment & Plan Note (Signed)
Will give her applications for [facility] Assistance and [place] assistance programs today.  Mavyret patient assistance signed.

## 2018-09-28 LAB — CBC
HCT: 43.6 % (ref 35.0–45.0)
Hemoglobin: 15.1 g/dL (ref 11.7–15.5)
MCH: 32 pg (ref 27.0–33.0)
MCHC: 34.6 g/dL (ref 32.0–36.0)
MCV: 92.4 fL (ref 80.0–100.0)
MPV: 11.2 fL (ref 7.5–12.5)
Platelets: 289 10*3/uL (ref 140–400)
RBC: 4.72 10*6/uL (ref 3.80–5.10)
RDW: 13.1 % (ref 11.0–15.0)
WBC: 6.5 10*3/uL (ref 3.8–10.8)

## 2018-09-28 LAB — COMPREHENSIVE METABOLIC PANEL
AG Ratio: 1.6 (calc) (ref 1.0–2.5)
ALT: 81 U/L — ABNORMAL HIGH (ref 6–29)
AST: 79 U/L — ABNORMAL HIGH (ref 10–30)
Albumin: 4.3 g/dL (ref 3.6–5.1)
Alkaline phosphatase (APISO): 83 U/L (ref 31–125)
BUN: 11 mg/dL (ref 7–25)
CO2: 27 mmol/L (ref 20–32)
Calcium: 9.4 mg/dL (ref 8.6–10.2)
Chloride: 105 mmol/L (ref 98–110)
Creat: 0.68 mg/dL (ref 0.50–1.10)
Globulin: 2.7 g/dL (calc) (ref 1.9–3.7)
Glucose, Bld: 96 mg/dL (ref 65–99)
Potassium: 4.1 mmol/L (ref 3.5–5.3)
Sodium: 141 mmol/L (ref 135–146)
Total Bilirubin: 0.3 mg/dL (ref 0.2–1.2)
Total Protein: 7 g/dL (ref 6.1–8.1)

## 2018-09-28 LAB — LIVER FIBROSIS, FIBROTEST-ACTITEST
ALT: 83 U/L — ABNORMAL HIGH (ref 6–29)
Alpha-2-Macroglobulin: 317 mg/dL — ABNORMAL HIGH (ref 106–279)
Apolipoprotein A1: 181 mg/dL (ref 101–198)
Bilirubin: 0.3 mg/dL (ref 0.2–1.2)
Fibrosis Score: 0.21
GGT: 70 U/L — ABNORMAL HIGH (ref 3–50)
Haptoglobin: 78 mg/dL (ref 43–212)
Necroinflammat ACT Score: 0.46
Reference ID: 3090281

## 2018-09-28 LAB — PROTIME-INR
INR: 1.1
Prothrombin Time: 10.9 s (ref 9.0–11.5)

## 2018-09-28 LAB — HEPATITIS C RNA QUANTITATIVE
HCV Quantitative Log: 4.1 Log IU/mL — ABNORMAL HIGH
HCV RNA, PCR, QN: 12500 IU/mL — ABNORMAL HIGH

## 2018-10-02 ENCOUNTER — Telehealth: Payer: Self-pay | Admitting: Pharmacy Technician

## 2018-10-02 ENCOUNTER — Telehealth: Payer: Self-pay | Admitting: Internal Medicine

## 2018-10-02 ENCOUNTER — Other Ambulatory Visit: Payer: Self-pay

## 2018-10-02 NOTE — Progress Notes (Signed)
Patient with established chronic Hep c with second + VL > 6 months after the first.  Genotype 3, Fibrosis score F0-1 as indicated on Fibrotest.   Will let pharmacy team know that we can proceed with mavyret x 8 weeks. I have sent Cheryl Hogan a MyChart message to discuss the results.

## 2018-10-02 NOTE — Telephone Encounter (Addendum)
[facility] Patient Advocate Encounter  Completed manufacturer application for [place] for 8 weeks in an effort to get medication provided at no charge.    She has been approved and [employer] will send the medication to the clinic for her to pick up here.  Cheryl Hogan. Cheryl Hogan Patient [facility] Phone: [phone] Fax:  [phone]

## 2018-10-02 NOTE — Telephone Encounter (Signed)
I tried calling, went straight to voice mail. I left a message.

## 2018-10-02 NOTE — Telephone Encounter (Signed)
Pt was at work and missed her Tele [place]ath Visit this morning.  Patient asking for someone to call her back if possible.

## 2018-10-04 ENCOUNTER — Telehealth: Payer: Self-pay

## 2018-10-04 NOTE — Telephone Encounter (Signed)
Requesting to speak with a nurse about something. Please call pt back.  °

## 2018-10-04 NOTE — Telephone Encounter (Signed)
Rtc, lm for rtc 

## 2018-10-09 ENCOUNTER — Other Ambulatory Visit: Payer: Self-pay

## 2018-10-09 ENCOUNTER — Ambulatory Visit (INDEPENDENT_AMBULATORY_CARE_PROVIDER_SITE_OTHER): Payer: Self-pay | Admitting: Internal Medicine

## 2018-10-09 DIAGNOSIS — F1120 Opioid dependence, uncomplicated: Secondary | ICD-10-CM

## 2018-10-09 MED ORDER — BUPRENORPHINE HCL-NALOXONE HCL 8-2 MG SL SUBL: 1.0000 | SUBLINGUAL_TABLET | Freq: Two times a day (BID) | SUBLINGUAL | 1 refills | Status: DC

## 2018-10-09 NOTE — Assessment & Plan Note (Addendum)
Ms. Tarver is doing very well.  She notes that she has been taking her buprenorphine without issue, she has no issue with the medication dissolving under her tongue.  She is proud of staying clean for this long.  I will send her questions to our pharmacist, including when/if the IM program for drug assistance is going to end and how to operate Good Rx coupons.  PDMP reviewed and appropriate; based on refill history it does not appear she always takes the medication twice per day.  However, her Utox has been appropriate since 02/2018 and Utox from August also appropriate.   Continue current dose of medication Follow up for telehealth visit in 4 weeks.

## 2018-10-09 NOTE — Telephone Encounter (Signed)
[facility] Patient Advocate Encounter  Patient called front desk looking for financial assistance for lab paperwork and to find out next steps for receiving her medication. She is currently residing at a hotel and will have her medication shipped to the clinic. [facility] confirmed they will be shipping both boxes of her treatment to arrive 10/15/2018. Patient will pick up on 10/13, at this time, she would also like to get the lab paperwork.

## 2018-10-09 NOTE — Progress Notes (Signed)
  [facility] Residency Telephone Encounter Continuity Care Appointment  HPI:  This telephone encounter was created for Ms. Cheryl Hogan on 10/09/2018 for the following purpose: Cheryl Hogan presents for follow up of opioid use disorder I have reviewed the prior induction visit, follow up visits, and telephone encounters relevant to opiate use disorder (OUD) treatment.   Current daily dose: 16mg  of buprenorphine daily  Date of Induction: 01/23/18  Current follow up interval, in weeks: 4 (televisits)  The patient has been adherent with the buprenorphine for OUD contract.   Last UDS Result: Appropriate.  Cheryl Hogan reports that she is doing very well.  She works at [employer] and has had no relapses.  She is proud that she has been clean for coming up on a year.  She is concerned about affording medications if the IM Program were to end for her medication.  She did find a Technical sales engineer with a good price, which she is hopeful will allow her to continue on buprenorphine.    Past Medical History:  Past Medical History:  Diagnosis Date  . Abscess of right hand 01/24/2018  . ADD (attention deficit disorder)   . Anxiety   . Cellulitis of left hand   . Depression   . Injection of illicit drug within last 12 months   . Opioid dependence on agonist therapy ([place])   . Psoriasis   . Pulmonary abscess ([place]) 01/24/2018      ROS:   She has no concerning symptoms today, no withdrawal symptoms   Assessment / Plan / Recommendations:   Please see A&P under problem oriented charting for assessment of the patient's acute and chronic medical conditions.   As always, pt is advised that if symptoms worsen or new symptoms arise, they should go to an urgent care facility or to to ER for further evaluation.   Consent and Medical Decision Making:   This is a telephone encounter between Cheryl Hogan and Gilles Chiquito on 10/09/2018 for follow up of opioid use disorder. The visit was conducted  with the patient located at home and Gilles Chiquito at [facility]. The patient's identity was confirmed using their DOB and current address. The patient has consented to being evaluated through a telephone encounter and understands the associated risks (an examination cannot be done and the patient may need to come in for an appointment) / benefits (allows the patient to remain at home, decreasing exposure to coronavirus). I personally spent 10 minutes on medical discussion.    Follow up with telehealth visit in 4 weeks.

## 2018-10-09 NOTE — Patient Instructions (Signed)
Instructions given over the phone.  

## 2018-10-15 ENCOUNTER — Telehealth: Payer: Self-pay | Admitting: Pharmacist

## 2018-10-15 NOTE — Telephone Encounter (Addendum)
[facility] Patient Advocate Encounter  Patient's medication has arrived and she is planning to come today to pick up instead of 10/13. Medication has been picked up.  I called [facility] to find out why they only shipped one box in this patient's shipment, the representative explained that they were mistaken and only one box is mailed at a time to the clinic but they will ship 2 boxes at a time if going a patient's home address. I updated the patient on this information. Next shipment will be sent out in 3 weeks.

## 2018-10-15 NOTE — Telephone Encounter (Signed)
Excellent, thank you! I know she is ready to get this treated.

## 2018-10-15 NOTE — Telephone Encounter (Signed)
Patient is approved to receive Mavyret x 8 weeks for chronic Hepatitis C infection. Counseled patient to take all three tablets of Mavyret daily with food.  Counseled patient the need to take all three tablets together and to not separate them out during the day. Encouraged patient not to miss any doses and explained how their chance of cure could go down with each dose missed. Counseled patient on what to do if dose is missed - if it is closer to the missed dose take immediately; if closer to next dose then skip dose and take the next dose at the usual time.   Counseled patient on common side effects such as headache, fatigue, and nausea and that these normally decrease with time. I reviewed patient medications and found no drug interactions. Discussed with patient that there are several drug interactions with Mavyret and instructed patient to call the clinic if she wishes to start a new medication during course of therapy. Also advised patient to call if she experiences any side effects. Patient will follow-up with me in the pharmacy clinic on 11/11.

## 2018-10-29 MED FILL — BUPRENORPHIN-NALOXON 8-2 MG: 8-2 | 14 days supply | Qty: 28 | Fill #1

## 2018-11-06 ENCOUNTER — Other Ambulatory Visit: Payer: Self-pay

## 2018-11-06 ENCOUNTER — Telehealth: Payer: Self-pay

## 2018-11-06 ENCOUNTER — Ambulatory Visit (INDEPENDENT_AMBULATORY_CARE_PROVIDER_SITE_OTHER): Payer: Self-pay | Admitting: Internal Medicine

## 2018-11-06 DIAGNOSIS — F1120 Opioid dependence, uncomplicated: Secondary | ICD-10-CM

## 2018-11-06 NOTE — Telephone Encounter (Signed)
Patient called office requesting refills on [place]. Will forward message to [facility], Pharmacist to advise on refills. [place]

## 2018-11-06 NOTE — Assessment & Plan Note (Signed)
She is doing well, no cravings, no relapse.  She prefers telehealth visits.  We will see her one more time for telehealth and then see her in person in January.  She is almost 1 year clean which she is proud of.   Plan Return in 5 weeks for visit (to get Refill and visit on cycle) Continue Suboxone 8-2mg  BID UDS in January when she returns to in person visits.

## 2018-11-06 NOTE — Patient Instructions (Signed)
Instructions given over the phone.  

## 2018-11-06 NOTE — Progress Notes (Signed)
  [facility] Medicine Residency Telephone Encounter Continuity Care Appointment  HPI:   This telephone encounter was created for Ms. Cheryl Hogan on 11/06/2018 for the follow up of OUD being treated with Suboxone.    11/06/2018  Cheryl Hogan presents for follow up of opioid use disorder I have reviewed the prior induction visit, follow up visits, and telephone encounters relevant to opiate use disorder (OUD) treatment.  Ms. Angello is taking buprenorphine twice daily, she reports no cravings and that she is doing well.  She would prefer to remain in telehealth.  She reports taking her medications without issue.   Current daily dose: Suboxone 8mg  BID  Date of Induction: 01/23/18  Current follow up interval, in weeks: 4 weeks  The patient has been adherent with the buprenorphine for OUD contract.   Last UDS Result: 08/14/18 - as expected.       Past Medical History:  Past Medical History:  Diagnosis Date  . Abscess of right hand 01/24/2018  . ADD (attention deficit disorder)   . Anxiety   . Cellulitis of left hand   . Depression   . Injection of illicit drug within last 12 months   . Opioid dependence on agonist therapy ([place])   . Psoriasis   . Pulmonary abscess ([place]) 01/24/2018      ROS:      Assessment / Plan / Recommendations:   Please see A&P under problem oriented charting for assessment of the patient's acute and chronic medical conditions.   As always, pt is advised that if symptoms worsen or new symptoms arise, they should go to an urgent care facility or to to ER for further evaluation.   Consent and Medical Decision Making:   This is a telephone encounter between Carlena Ruybal and Gilles Chiquito on 11/06/2018 for follow up of OUD. The visit was conducted with the patient located at home and Gilles Chiquito at [facility]. The patient's identity was confirmed using their DOB and full name. The patient has consented to being evaluated through a telephone encounter and  understands the associated risks (an examination cannot be done and the patient may need to come in for an appointment) / benefits (allows the patient to remain at home, decreasing exposure to coronavirus). I personally spent 10 minutes on medical discussion.    Please see A&P for further details.   Follow up in 5 weeks.

## 2018-11-07 NOTE — Telephone Encounter (Addendum)
[facility] Patient Advocate Encounter  Spoke with [facility] pharmacy to check on the status of her next shipment to the clinic. It appears when they split the last order it did not flag to send her second shipment. The cut off for mailing would have it arriving some time Friday but requires signature so they were going to push it to Monday arrival. This would be cutting it close to the day the patient would need their next dose. The representative said Mavyret is still in short supply and when she checked to get an override for overnight shipment, the medication was out of stock. I let the patient know Monday is the anticipated arrival for her medication and she will pick up a box at her 11:30am appointment. She works nights and would not be able to make a 2:30pm appointment time.  At the time of her appointment on 11/09, she would like to get information on labwork financial assistance and fill out the paperwork to submit for reimbursement.

## 2018-11-12 ENCOUNTER — Other Ambulatory Visit: Payer: Self-pay

## 2018-11-12 ENCOUNTER — Ambulatory Visit (INDEPENDENT_AMBULATORY_CARE_PROVIDER_SITE_OTHER): Payer: Self-pay | Admitting: Pharmacist

## 2018-11-12 DIAGNOSIS — B182 Chronic viral hepatitis C: Secondary | ICD-10-CM

## 2018-11-12 DIAGNOSIS — Z0000 Encounter for general adult medical examination without abnormal findings: Secondary | ICD-10-CM

## 2018-11-12 NOTE — Progress Notes (Signed)
HPI: Cheryl Hogan is a 33 y.o. female who presents to the [facility] clinic for Hepatitis C follow-up.  Medication: Mavyret  Start Date: 10/15/2018  Hepatitis C Genotype: 3  Fibrosis Score: F0/F1  Hepatitis C RNA: 12,500 on 09/24/2018  Patient Active Problem List   Diagnosis Date Noted  . Healthcare maintenance 09/24/2018  . Underinsured 09/24/2018  . Opioid dependence on agonist therapy ([place])   . Cigarette smoker 03/06/2018  . Normocytic anemia 03/06/2018  . Chronic hepatitis C ([place]) 02/13/2018  . Opioid use disorder, severe, dependence ([place]) 02/06/2018  . Anxiety and depression 02/06/2018    Patient's Medications  New Prescriptions   No medications on file  Previous Medications   BUPRENORPHINE-NALOXONE (SUBOXONE) 8-2 MG SUBL SL TABLET    Place 1 tablet under the tongue 2 (two) times daily.   GLECAPREVIR-PIBRENTASVIR (MAVYRET) 100-40 MG TABS    Take 1 tablet by mouth daily with breakfast.   TRIAMCINOLONE OINTMENT (KENALOG) 0.1 %    Apply 1 application topically 2 (two) times daily.  Modified Medications   No medications on file  Discontinued Medications   No medications on file    Allergies: No Known Allergies  Past Medical History: Past Medical History:  Diagnosis Date  . Abscess of right hand 01/24/2018  . ADD (attention deficit disorder)   . Anxiety   . Cellulitis of left hand   . Depression   . Injection of illicit drug within last 12 months   . Opioid dependence on agonist therapy ([place])   . Psoriasis   . Pulmonary abscess ([place]) 01/24/2018    Social History: Social History   Socioeconomic History  . Marital status: Married    Spouse name: Not on file  . Number of children: Not on file  . Years of education: Not on file  . Highest education level: Not on file  Occupational History  . Not on file  Social Needs  . Financial resource strain: Not on file  . Food insecurity    Worry: Not on file    Inability: Not on file  . Transportation  needs    Medical: Not on file    Non-medical: Not on file  Tobacco Use  . Smoking status: Current Every Day Smoker    Packs/day: 0.40    Types: Cigarettes  . Smokeless tobacco: Never Used  . Tobacco comment: .5 PPD  Substance and Sexual Activity  . Alcohol use: Not Currently  . Drug use: Not Currently    Comment: Last used IV heroin January 2020  . Sexual activity: Not Currently  Lifestyle  . Physical activity    Days per week: Not on file    Minutes per session: Not on file  . Stress: Not on file  Relationships  . Social Herbalist on phone: Not on file    Gets together: Not on file    Attends religious service: Not on file    Active member of club or organization: Not on file    Attends meetings of clubs or organizations: Not on file    Relationship status: Not on file  Other Topics Concern  . Not on file  Social History Narrative  . Not on file    Labs: Hepatitis C Lab Results  Component Value Date   HCVRNAPCRQN 12,500 (H) 09/24/2018   FIBROSTAGE F0-F1 09/24/2018   Hepatitis B Lab Results  Component Value Date   HEPBSAG Negative 02/13/2018   HEPBCAB Negative 02/13/2018   Hepatitis  A No results found for: HAV HIV Lab Results  Component Value Date   HIV Non Reactive 01/24/2018   Lab Results  Component Value Date   CREATININE 0.68 09/24/2018   CREATININE 0.57 01/31/2018   CREATININE 0.49 01/26/2018   CREATININE 0.34 (L) 01/25/2018   CREATININE 0.50 01/23/2018   Lab Results  Component Value Date   AST 79 (H) 09/24/2018   AST 83 (H) 02/06/2018   ALT 81 (H) 09/24/2018   ALT 83 (H) 09/24/2018   ALT 90 (H) 02/06/2018   INR 1.1 09/24/2018    Assessment: Cheryl Hogan is here for follow up on her hepatitis C treatment and also to pick up her second and final month of Mavyret. She reports taking all three tablets at the same time everyday with food. She does complain of nausea, headaches and fatigue which are commonly associated with this medication.  However, she does say these side effects have improved since starting treatment and are manageable. She did report one instance of forgetting to take her medication, however she remembered only a few hours later and took it as is recommended. Today we will check a hepatitis C viral load to see how she is responding to the medication and see her back in a month when she is scheduled to finish treatment. She cannot remember if she has received the influenza vaccine this year or not so we will hold off on vaccinating today until she can confirm one way or the other.    Plan: - Mavyret x1 more month - Hepatitis C viral load, CMET - Follow up 12/9 with Cheryl Hogan  Cheryl Hogan, PharmD PGY2 Infectious Disease Pharmacy Resident  [facility] 11/12/2018, 10:36 AM

## 2018-11-13 ENCOUNTER — Telehealth: Payer: Self-pay | Admitting: *Deleted

## 2018-11-13 NOTE — Telephone Encounter (Signed)
Patient has noticed a new brownish tint to her hands, is wondering if this could be a side effect of mavyret.  RN asked if she was changing lotions. She stated she just started using Jergen's natural glow, just realized it has a sunless tanner in it.  RN praised patient for taking her health seriously and asking the question.  She will continue to take the [facility], felt relieved (and a little embarrassed).  Landis Gandy, RN

## 2018-11-14 ENCOUNTER — Ambulatory Visit: Payer: Self-pay | Admitting: Pharmacist

## 2018-11-14 NOTE — Telephone Encounter (Signed)
Thank you :)

## 2018-11-16 LAB — COMPREHENSIVE METABOLIC PANEL
AG Ratio: 1.8 (calc) (ref 1.0–2.5)
ALT: 42 U/L — ABNORMAL HIGH (ref 6–29)
AST: 37 U/L — ABNORMAL HIGH (ref 10–30)
Albumin: 4.5 g/dL (ref 3.6–5.1)
Alkaline phosphatase (APISO): 72 U/L (ref 31–125)
BUN: 10 mg/dL (ref 7–25)
CO2: 26 mmol/L (ref 20–32)
Calcium: 9.6 mg/dL (ref 8.6–10.2)
Chloride: 103 mmol/L (ref 98–110)
Creat: 0.61 mg/dL (ref 0.50–1.10)
Globulin: 2.5 g/dL (calc) (ref 1.9–3.7)
Glucose, Bld: 91 mg/dL (ref 65–99)
Potassium: 4.2 mmol/L (ref 3.5–5.3)
Sodium: 138 mmol/L (ref 135–146)
Total Bilirubin: 0.4 mg/dL (ref 0.2–1.2)
Total Protein: 7 g/dL (ref 6.1–8.1)

## 2018-11-16 LAB — HEPATITIS C RNA QUANTITATIVE
HCV Quantitative Log: <1.18 Log IU/mL
HCV RNA, PCR, QN: <15 IU/mL

## 2018-11-19 ENCOUNTER — Other Ambulatory Visit: Payer: Self-pay | Admitting: Internal Medicine

## 2018-11-19 MED FILL — BUPRENORPHIN-NALOXON 8-2 MG: 8-2 | 14 days supply | Qty: 28 | Fill #0

## 2018-11-19 NOTE — Telephone Encounter (Signed)
Needs refill on buprenorphine-naloxone (SUBOXONE) 8-2 mg SUBL SL tablet  ;pt contact [place], [place] - [address] [facility].

## 2018-12-03 ENCOUNTER — Telehealth: Payer: Self-pay | Admitting: Internal Medicine

## 2018-12-03 MED FILL — BUPRENORPHIN-NALOXON 8-2 MG: 8-2 | 14 days supply | Qty: 28 | Fill #0

## 2018-12-03 NOTE — Telephone Encounter (Signed)
Pt calling back again about her   buprenorphine-naloxone (SUBOXONE) 8-2 mg SUBL SL tablet  [place], [place] - [address] [place].

## 2018-12-03 NOTE — Telephone Encounter (Signed)
#  [phone] with 1 refill sent 11/19/2018. Confirmed with Manuela Schwartz at [facility] that they filled this for patient but she never picked it up so it was returned to stock. She will get this ready for patient now. Call placed to patient. States this is not what she was told by Pharmacy. States she's not out of suboxone but cannot say how may tabs she is taking daily; doesn't keep track. She is trying not to take them, tries to do other activities when feels craving. Confirmed telehealth OUD appt on 12/11/2018 at 0945. Hubbard Hartshorn, BSN, RN-BC

## 2018-12-11 ENCOUNTER — Ambulatory Visit (INDEPENDENT_AMBULATORY_CARE_PROVIDER_SITE_OTHER): Payer: Self-pay | Admitting: Internal Medicine

## 2018-12-11 ENCOUNTER — Other Ambulatory Visit: Payer: Self-pay | Admitting: Pharmacist

## 2018-12-11 ENCOUNTER — Other Ambulatory Visit: Payer: Self-pay

## 2018-12-11 DIAGNOSIS — F1120 Opioid dependence, uncomplicated: Secondary | ICD-10-CM

## 2018-12-11 DIAGNOSIS — B182 Chronic viral hepatitis C: Secondary | ICD-10-CM

## 2018-12-11 MED ORDER — BUPRENORPHINE HCL-NALOXONE HCL 8-2 MG SL SUBL: 1.0000 | SUBLINGUAL_TABLET | Freq: Two times a day (BID) | SUBLINGUAL | 1 refills | Status: DC

## 2018-12-11 NOTE — Progress Notes (Signed)
  [facility] Internal Medicine Residency Telephone Encounter Continuity Care Appointment  HPI:   This telephone encounter was created for Ms. [name] on 12/11/2018 for the follow up of OUD being treated with Suboxone.    12/11/2018  [name] presents for follow up of opioid use disorder I have reviewed the prior induction visit, follow up visits, and telephone encounters relevant to opiate use disorder (OUD) treatment.  Ms. Baune is taking buprenorphine twice daily, she reports no cravings and that she is doing well.  She would prefer to remain in telehealth.  She reports taking her medications without issue.   Current daily dose: Suboxone 8mg  BID  Date of Induction: 01/23/18  Current follow up interval, in weeks: 4 weeks  The patient has been adherent with the buprenorphine for OUD contract.   Last UDS Result: 08/14/18 - as expected.       Past Medical History:  Past Medical History:  Diagnosis Date  . Abscess of right hand 01/24/2018  . ADD (attention deficit disorder)   . Anxiety   . Cellulitis of left hand   . Depression   . Injection of illicit drug within last 12 months   . Opioid dependence on agonist therapy ([place])   . Psoriasis   . Pulmonary abscess ([place]) 01/24/2018     ROS:      Assessment / Plan / Recommendations:   Please see A&P under problem oriented charting for assessment of the patient's acute and chronic medical conditions.   As always, pt is advised that if symptoms worsen or new symptoms arise, they should go to an urgent care facility or to to ER for further evaluation.   Consent and Medical Decision Making:   This is a telephone encounter between Sole Lengacher and Lucious Groves on 12/11/2018 for follow up of OUD. The visit was conducted with the patient located at home and Lucious Groves at [facility]. The patient's identity was confirmed using their DOB and full name. The patient has consented to being evaluated through a telephone encounter  and understands the associated risks (an examination cannot be done and the patient may need to come in for an appointment) / benefits (allows the patient to remain at home, decreasing exposure to coronavirus). I personally spent 9 minutes on medical discussion.    Please see A&P for further details.   Follow up in 4-5 weeks.
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
## 2018-12-12 ENCOUNTER — Ambulatory Visit: Payer: Self-pay | Admitting: Pharmacist

## 2018-12-17 MED FILL — BUPRENORPHIN-NALOXON 8-2 MG: 8-2 | 14 days supply | Qty: 28 | Fill #1

## 2019-01-08 MED FILL — BUPRENORPHIN-NALOXON 8-2 MG: 8-2 | 14 days supply | Qty: 28 | Fill #0

## 2019-01-09 ENCOUNTER — Ambulatory Visit: Payer: Self-pay | Admitting: Pharmacist

## 2019-01-10 ENCOUNTER — Ambulatory Visit: Payer: Self-pay | Admitting: Pharmacist

## 2019-01-31 MED FILL — BUPRENORPHIN-NALOXON 8-2 MG: 8-2 | 14 days supply | Qty: 28 | Fill #1

## 2019-02-19 ENCOUNTER — Ambulatory Visit (INDEPENDENT_AMBULATORY_CARE_PROVIDER_SITE_OTHER): Payer: Self-pay | Admitting: Student in an Organized Health Care Education/Training Program

## 2019-02-19 ENCOUNTER — Other Ambulatory Visit: Payer: Self-pay

## 2019-02-19 ENCOUNTER — Ambulatory Visit (INDEPENDENT_AMBULATORY_CARE_PROVIDER_SITE_OTHER): Payer: Self-pay | Admitting: Pharmacist

## 2019-02-19 VITALS — BP 103/69 | HR 78 | Temp 98.7°F | Wt 213.1 lb

## 2019-02-19 DIAGNOSIS — Z872 Personal history of diseases of the skin and subcutaneous tissue: Secondary | ICD-10-CM

## 2019-02-19 DIAGNOSIS — B182 Chronic viral hepatitis C: Secondary | ICD-10-CM

## 2019-02-19 DIAGNOSIS — Z86711 Personal history of pulmonary embolism: Secondary | ICD-10-CM

## 2019-02-19 DIAGNOSIS — F1120 Opioid dependence, uncomplicated: Secondary | ICD-10-CM

## 2019-02-19 DIAGNOSIS — Z8619 Personal history of other infectious and parasitic diseases: Secondary | ICD-10-CM

## 2019-02-19 MED ORDER — BUPRENORPHINE HCL-NALOXONE HCL 8-2 MG SL SUBL: 1.0000 | SUBLINGUAL_TABLET | Freq: Two times a day (BID) | SUBLINGUAL | 1 refills | Status: DC

## 2019-02-19 MED FILL — BUPRENORPHIN-NALOXON 8-2 MG: 8-2 | 14 days supply | Qty: 28 | Fill #0

## 2019-02-19 NOTE — Progress Notes (Signed)
   Assessment and Plan:  See Encounters tab for problem-based medical decision making.   __________________________________________________________  HPI:   34 year old person here for follow-up of medication assisted treatment of opioid use disorder.  History of chronic hepatitis C that has been treated with Mavyret last fall.  She is planning on getting labs today with [facility] to ensure sustained viral clearance.  She reports doing well with Suboxone over the last few months, 1 missed appointment with [place] in January but may have been some confusion on scheduling on our part.  She has had no lapses in Suboxone use.  Denies any relapses.  Has been on treatment for about a year.  Reports cravings are well controlled.  Denies any other side effects.  We first met this patient in January 2020 when she was hospitalized for a right hand abscess which required surgical debridement and a week of IV antibiotics.  Thankfully she was ruled out for endocarditis or bloodstream infection at that time, though she did have septic lung emboli.  Now she is working a full-time job, reports a safe living environment, seems like she has really made significant positive progress.  __________________________________________________________  Problem List: Patient Active Problem List   Diagnosis Date Noted  . Healthcare maintenance 09/24/2018  . Underinsured 09/24/2018  . Opioid dependence on agonist therapy ([place])   . Cigarette smoker 03/06/2018  . Normocytic anemia 03/06/2018  . Chronic hepatitis C ([place]) 02/13/2018  . Opioid use disorder, severe, dependence ([place]) 02/06/2018  . Anxiety and depression 02/06/2018    Medications: Reconciled today in Epic __________________________________________________________  Physical Exam:  Vital Signs: Vitals:   02/19/19 0936  BP: 103/69  Pulse: 78  Temp: 98.7 F (37.1 C)  TempSrc: Oral  SpO2: 98%  Weight: 213 lb 1.6 oz (96.7 kg)    Gen: Well appearing, NAD Neck:  No cervical LAD, No thyromegaly or nodules, No JVD. CV: RRR, no murmurs Pulm: Normal effort, CTA throughout, no wheezing Ext: Warm, no edema, normal joints

## 2019-02-19 NOTE — Assessment & Plan Note (Signed)
Reportedly well controlled.  Some inconsistency with her follow-up visits lately, but nothing that the red flag.  Plan to check a urine tox assure today.  I reviewed the database which was appropriate.  She accesses generic Suboxone through the [facility] outpatient pharmacy program, says it is a bit of a long drive but there is no other pharmacy closer that provides it affordably.  I provided another 4-week prescription of generic Suboxone, 2 tablets daily.  Follow-up by telephone visit in 4 weeks.

## 2019-02-19 NOTE — Progress Notes (Signed)
HPI: Cheryl Hogan is a 34 y.o. female who presents to the [facility] pharmacy clinic for Hepatitis C follow-up.  Medication: Mavyret x 8 weeks  Start Date: 10/15/18  Hepatitis C Genotype: 3  Fibrosis Score: F0/F1  Hepatitis C RNA: 12,500  Patient Active Problem List   Diagnosis Date Noted  . Healthcare maintenance 09/24/2018  . Underinsured 09/24/2018  . Opioid dependence on agonist therapy (HCC)   . Cigarette smoker 03/06/2018  . Normocytic anemia 03/06/2018  . Chronic hepatitis C (HCC) 02/13/2018  . Opioid use disorder, severe, dependence (HCC) 02/06/2018  . Anxiety and depression 02/06/2018    Patient's Medications  New Prescriptions   No medications on file  Previous Medications   BUPRENORPHINE-NALOXONE (SUBOXONE) 8-2 MG SUBL SL TABLET    Place 1 tablet under the tongue 2 (two) times daily.   TRIAMCINOLONE OINTMENT (KENALOG) 0.1 %    Apply 1 application topically 2 (two) times daily.  Modified Medications   No medications on file  Discontinued Medications   No medications on file    Allergies: No Known Allergies  Past Medical History: Past Medical History:  Diagnosis Date  . Abscess of right hand 01/24/2018  . ADD (attention deficit disorder)   . Anxiety   . Cellulitis of left hand   . Depression   . Injection of illicit drug within last 12 months   . Opioid dependence on agonist therapy (HCC)   . Psoriasis   . Pulmonary abscess (HCC) 01/24/2018    Social History: Social History   Socioeconomic History  . Marital status: Married    Spouse name: Not on file  . Number of children: Not on file  . Years of education: Not on file  . Highest education level: Not on file  Occupational History  . Not on file  Tobacco Use  . Smoking status: Current Every Day Smoker    Packs/day: 0.40    Types: Cigarettes  . Smokeless tobacco: Never Used  . Tobacco comment: .5 PPD  Substance and Sexual Activity  . Alcohol use: Not Currently  . Drug use: Not Currently   Comment: Last used IV heroin January 2020  . Sexual activity: Not Currently  Other Topics Concern  . Not on file  Social History Narrative  . Not on file   Social Determinants of Health   Financial Resource Strain:   . Difficulty of Paying Living Expenses: Not on file  Food Insecurity:   . Worried About Programme researcher, broadcasting/film/video in the Last Year: Not on file  . Ran Out of Food in the Last Year: Not on file  Transportation Needs:   . Lack of Transportation (Medical): Not on file  . Lack of Transportation (Non-Medical): Not on file  Physical Activity:   . Days of Exercise per Week: Not on file  . Minutes of Exercise per Session: Not on file  Stress:   . Feeling of Stress : Not on file  Social Connections:   . Frequency of Communication with Friends and Family: Not on file  . Frequency of Social Gatherings with Friends and Family: Not on file  . Attends Religious Services: Not on file  . Active Member of Clubs or Organizations: Not on file  . Attends Banker Meetings: Not on file  . Marital Status: Not on file    Labs: Hepatitis C Lab Results  Component Value Date   HCVRNAPCRQN <15 NOT DETECTED 11/12/2018   HCVRNAPCRQN 12,500 (H) 09/24/2018   FIBROSTAGE F0-F1  09/24/2018   Hepatitis B Lab Results  Component Value Date   HEPBSAG Negative 02/13/2018   HEPBCAB Negative 02/13/2018   Hepatitis A No results found for: HAV HIV Lab Results  Component Value Date   HIV Non Reactive 01/24/2018   Lab Results  Component Value Date   CREATININE 0.61 11/12/2018   CREATININE 0.68 09/24/2018   CREATININE 0.57 01/31/2018   CREATININE 0.49 01/26/2018   CREATININE 0.34 (L) 01/25/2018   Lab Results  Component Value Date   AST 37 (H) 11/12/2018   AST 79 (H) 09/24/2018   AST 83 (H) 02/06/2018   ALT 42 (H) 11/12/2018   ALT 81 (H) 09/24/2018   ALT 83 (H) 09/24/2018   INR 1.1 09/24/2018    Assessment: Cheryl Hogan is here for end of treatment hepatitis C follow-up. She was  taking Mavyret for 8 weeks and finished in December with no missed doses. Reports taking 3 tablets all at once every day. She rescheduled her end of treatment appointment in December for today. Tanita said that she keeps getting bills for the hepatitis C labs even though we enrolled her in a hepatitis C treatment coverage program. We explained to her that she should not be paying anything for her treatment and she should call the number on the bill form to see if all the bills have been paid. Since her cure appointment is in March, we will hold off on checking viral load today. Her last viral load in November was undetectable. Explained to the patient that she will have hepatitis C antibodies, and she should notify future healthcare providers about this.   Plan: - No labs today. Will do HCV RNA and CMET at next appointment - Will follow-up with Cassie March 20 at 3:30 PM  Julieta Bellini, 4th Year PharmD Candidate

## 2019-02-22 LAB — TOXASSURE SELECT,+ANTIDEPR,UR

## 2019-03-05 MED FILL — BUPRENORPHIN-NALOXON 8-2 MG: 8-2 | 14 days supply | Qty: 28 | Fill #1

## 2019-03-19 ENCOUNTER — Ambulatory Visit (INDEPENDENT_AMBULATORY_CARE_PROVIDER_SITE_OTHER): Payer: Self-pay | Admitting: Internal Medicine

## 2019-03-19 ENCOUNTER — Other Ambulatory Visit: Payer: Self-pay

## 2019-03-19 DIAGNOSIS — F1120 Opioid dependence, uncomplicated: Secondary | ICD-10-CM

## 2019-03-19 MED ORDER — BUPRENORPHINE HCL-NALOXONE HCL 8-2 MG SL SUBL: 1.0000 | SUBLINGUAL_TABLET | Freq: Two times a day (BID) | SUBLINGUAL | 1 refills | Status: DC

## 2019-03-19 MED FILL — BUPRENORPHIN-NALOXON 8-2 MG: 8-2 | 14 days supply | Qty: 28 | Fill #0

## 2019-03-19 NOTE — Progress Notes (Signed)
    This is a telephone encounter between Abbygael Curtiss and Reymundo Poll on 03/19/2019 for OUD follow up. The visit was conducted with the patient located at home and Reymundo Poll at [facility]. The patient's identity was confirmed using their DOB and current address. The patient has consented to being evaluated through a telephone encounter and understands the associated risks (an examination cannot be done and the patient may need to come in for an appointment) / benefits (allows the patient to remain at home, decreasing exposure to coronavirus). I personally spent 5 minutes on medical discussion.    Jelene Albano presents for follow up of opioid use disorder I have reviewed the prior induction visit, follow up visits, and telephone encounters relevant to opiate use disorder (OUD) treatment.   Current daily dose: Suboxone 8-2 mg BID  Date of Induction: 01/23/18  Current follow up interval, in weeks: 4 weeks  The patient has been adherent with the buprenorphine for OUD contract.   Last UDS Result: Appropriate for buprenorphine and metabolite, inappropriate for ethanol metabolites.   HPI: For the details of today's visit, please refer to the assessment and plan.   Review of Systems  Gastrointestinal: Negative for nausea and vomiting.  Neurological: Negative for tremors.     Assessment & Plan:   See Encounters Tab for problem based charting.

## 2019-03-19 NOTE — Addendum Note (Signed)
Addended by: Burnell Blanks on: 03/19/2019 02:14 PM   Modules accepted: Level of Service

## 2019-03-19 NOTE — Assessment & Plan Note (Signed)
Reports symptoms are well controlled. Denies cravings and relapse. We discussed her last UDS being positive for ethanol metabolites. She admitted she "might" have been drinking the day of her visit, she doesn't remember. I asked about her alcohol use. She denies this is a problem for her. She says she drinks only once a month. Advised caution with suboxone use. Will continue to monitor. Database reviewed, refill history appropriate. Sent 4 week prescriptions of her suboxone to [facility] outpatient pharmacy. Follow up 4 weeks in person.

## 2019-04-02 ENCOUNTER — Ambulatory Visit: Payer: Self-pay | Admitting: Pharmacist

## 2019-04-09 MED FILL — BUPRENORPHIN-NALOXON 8-2 MG: 8-2 | 14 days supply | Qty: 28 | Fill #1

## 2019-04-16 ENCOUNTER — Ambulatory Visit (INDEPENDENT_AMBULATORY_CARE_PROVIDER_SITE_OTHER): Payer: Self-pay | Admitting: Internal Medicine

## 2019-04-16 ENCOUNTER — Other Ambulatory Visit: Payer: Self-pay

## 2019-04-16 DIAGNOSIS — F1120 Opioid dependence, uncomplicated: Secondary | ICD-10-CM

## 2019-04-16 DIAGNOSIS — Z8619 Personal history of other infectious and parasitic diseases: Secondary | ICD-10-CM

## 2019-04-16 NOTE — Assessment & Plan Note (Signed)
HPI: She reports she is doing very well on Suboxone 2 tablets daily.  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  She also feels things are going well with her husband and overall has a positive outlook on life.  She feels good that she has been treated for hepatitis C.  She has not had any opioid cravings while on Suboxone. Last U tox was in February and was appropriate.  Assessment OUD severe in remission  Plan: Continue Suboxone 8-2 mg sublingual tablets 1 tablet twice daily. We will have her follow-up in 1 month in person.

## 2019-04-16 NOTE — Progress Notes (Signed)
  [facility] Internal Medicine Residency Telephone Encounter Continuity Care Appointment  HPI:   This telephone encounter was created for Ms. Cheryl Hogan on 04/16/2019 for the following purpose/cc OUD.   Past Medical History:  Past Medical History:  Diagnosis Date  . Abscess of right hand 01/24/2018  . ADD (attention deficit disorder)   . Anxiety   . Cellulitis of left hand   . Depression   . Injection of illicit drug within last 12 months   . Opioid dependence on agonist therapy (HCC)   . Psoriasis   . Pulmonary abscess (HCC) 01/24/2018      Assessment / Plan / Recommendations:   Please see A&P under problem oriented charting for assessment of the patient's acute and chronic medical conditions.   As always, pt is advised that if symptoms worsen or new symptoms arise, they should go to an urgent care facility or to to ER for further evaluation.   Consent and Medical Decision Making:    This is a telephone encounter between Cheryl Hogan and Gust Rung on 04/16/2019 for OUD. The visit was conducted with the patient located at home and Gust Rung at [facility]. The patient's identity was confirmed using their DOB and current address. The patient has consented to being evaluated through a telephone encounter and understands the associated risks (an examination cannot be done and the patient may need to come in for an appointment) / benefits (allows the patient to remain at home, decreasing exposure to coronavirus). I personally spent 8 minutes on medical discussion.

## 2019-04-30 ENCOUNTER — Other Ambulatory Visit: Payer: Self-pay | Admitting: Internal Medicine

## 2019-04-30 DIAGNOSIS — F1120 Opioid dependence, uncomplicated: Secondary | ICD-10-CM

## 2019-04-30 MED ORDER — BUPRENORPHINE HCL-NALOXONE HCL 8-2 MG SL SUBL: 1.0000 | SUBLINGUAL_TABLET | Freq: Two times a day (BID) | SUBLINGUAL | 1 refills | Status: DC

## 2019-04-30 MED FILL — BUPRENORPHIN-NALOXON 8-2 MG: 8-2 | 14 days supply | Qty: 28 | Fill #0

## 2019-04-30 NOTE — Addendum Note (Signed)
Addended by: Debe Coder B on: 04/30/2019 04:29 PM   Modules accepted: Orders

## 2019-05-01 NOTE — Telephone Encounter (Signed)
Appt has been sch for 05/14/2019 @ 9:15 am with he OUD clinic.

## 2019-05-02 ENCOUNTER — Ambulatory Visit: Payer: Self-pay | Admitting: Pharmacist

## 2019-05-13 ENCOUNTER — Ambulatory Visit: Payer: Self-pay | Admitting: Pharmacist

## 2019-05-13 ENCOUNTER — Other Ambulatory Visit: Payer: Self-pay

## 2019-05-13 DIAGNOSIS — B182 Chronic viral hepatitis C: Secondary | ICD-10-CM

## 2019-05-13 NOTE — Progress Notes (Signed)
HPI: Cheryl Hogan is a 34 y.o. female who presents to the [place] clinic for Hepatitis C follow-up.  Medication: Mavyret x 8 weeks  Start Date: 10/15/18  Hepatitis C Genotype: 3  Fibrosis Score: F0/F1  Hepatitis C RNA: Undetectable  Patient Active Problem List   Diagnosis Date Noted  . Healthcare maintenance 09/24/2018  . Underinsured 09/24/2018  . Opioid dependence on agonist therapy ([place])   . Cigarette smoker 03/06/2018  . Normocytic anemia 03/06/2018  . History of hepatitis C 02/13/2018  . Opioid use disorder, severe, dependence ([place]) 02/06/2018  . Anxiety and depression 02/06/2018    Patient's Medications  New Prescriptions   No medications on file  Previous Medications   BUPRENORPHINE-NALOXONE (SUBOXONE) 8-2 MG SUBL SL TABLET    Place 1 tablet under the tongue 2 (two) times daily.   TRIAMCINOLONE OINTMENT (KENALOG) 0.1 %    Apply 1 application topically 2 (two) times daily.  Modified Medications   No medications on file  Discontinued Medications   No medications on file    Allergies: No Known Allergies  Past Medical History: Past Medical History:  Diagnosis Date  . Abscess of right hand 01/24/2018  . ADD (attention deficit disorder)   . Anxiety   . Cellulitis of left hand   . Depression   . Injection of illicit drug within last 12 months   . Opioid dependence on agonist therapy ([place])   . Psoriasis   . Pulmonary abscess ([place]) 01/24/2018    Social History: Social History   Socioeconomic History  . Marital status: Married    Spouse name: Not on file  . Number of children: Not on file  . Years of education: Not on file  . Highest education level: Not on file  Occupational History  . Not on file  Tobacco Use  . Smoking status: Current Every Day Smoker    Packs/day: 0.40    Types: Cigarettes  . Smokeless tobacco: Never Used  . Tobacco comment: .5 PPD  Substance and Sexual Activity  . Alcohol use: Not Currently  . Drug use: Not  Currently    Comment: Last used IV heroin January 2020  . Sexual activity: Not Currently  Other Topics Concern  . Not on file  Social History Narrative  . Not on file   Social Determinants of Health   Financial Resource Strain:   . Difficulty of Paying Living Expenses:   Food Insecurity:   . Worried About Charity fundraiser in the Last Year:   . Arboriculturist in the Last Year:   Transportation Needs:   . Film/video editor (Medical):   Marland Kitchen Lack of Transportation (Non-Medical):   Physical Activity:   . Days of Exercise per Week:   . Minutes of Exercise per Session:   Stress:   . Feeling of Stress :   Social Connections:   . Frequency of Communication with Friends and Family:   . Frequency of Social Gatherings with Friends and Family:   . Attends Religious Services:   . Active Member of Clubs or Organizations:   . Attends Archivist Meetings:   Marland Kitchen Marital Status:     Labs: Hepatitis C Lab Results  Component Value Date   HCVRNAPCRQN <15 NOT DETECTED 11/12/2018   HCVRNAPCRQN 12,500 (H) 09/24/2018   FIBROSTAGE F0-F1 09/24/2018   Hepatitis B Lab Results  Component Value Date   HEPBSAG Negative 02/13/2018   HEPBCAB Negative 02/13/2018   Hepatitis A No  results found for: HAV HIV Lab Results  Component Value Date   HIV Non Reactive 01/24/2018   Lab Results  Component Value Date   CREATININE 0.61 11/12/2018   CREATININE 0.68 09/24/2018   CREATININE 0.57 01/31/2018   CREATININE 0.49 01/26/2018   CREATININE 0.34 (L) 01/25/2018   Lab Results  Component Value Date   AST 37 (H) 11/12/2018   AST 79 (H) 09/24/2018   AST 83 (H) 02/06/2018   ALT 42 (H) 11/12/2018   ALT 81 (H) 09/24/2018   ALT 83 (H) 09/24/2018   INR 1.1 09/24/2018    Assessment: Cheryl Hogan presents today for a 26-month cure Hepatitis C follow-up visit. She completed 8 weeks of Mavyret in December 2020 with no missed doses. Last HepC viral load was undetectable in November 2020. Patient  reports doing well with no complaints. Discussed limiting risky behavior as she can still be reinfected with HepC even though she has completed treatment. Also explained to patient that she will always have Hepatitis C antibodies and should notify future healthcare providers that she has completed treatment. Patient verbalized understanding.   Plan: -Check HCV RNA today  Fabio Neighbors, PharmD PGY1 Ambulatory Care Resident [facility] for Infectious Disease 05/13/2019, 1:00 PM

## 2019-05-14 ENCOUNTER — Other Ambulatory Visit: Payer: Self-pay

## 2019-05-14 ENCOUNTER — Ambulatory Visit (INDEPENDENT_AMBULATORY_CARE_PROVIDER_SITE_OTHER): Payer: Self-pay | Admitting: Internal Medicine

## 2019-05-14 VITALS — BP 112/68 | HR 83 | Temp 98.0°F | Ht 69.0 in | Wt 212.2 lb

## 2019-05-14 DIAGNOSIS — Z6839 Body mass index (BMI) 39.0-39.9, adult: Secondary | ICD-10-CM

## 2019-05-14 DIAGNOSIS — E669 Obesity, unspecified: Secondary | ICD-10-CM

## 2019-05-14 DIAGNOSIS — F1120 Opioid dependence, uncomplicated: Secondary | ICD-10-CM

## 2019-05-15 LAB — HEPATITIS C RNA QUANTITATIVE
HCV Quantitative Log: <1.18 Log IU/mL
HCV RNA, PCR, QN: <15 IU/mL

## 2019-05-16 ENCOUNTER — Telehealth: Payer: Self-pay | Admitting: Pharmacist

## 2019-05-16 DIAGNOSIS — E669 Obesity, unspecified: Secondary | ICD-10-CM | POA: Insufficient documentation

## 2019-05-16 NOTE — Telephone Encounter (Cosign Needed)
Informed patient HCV RNA viral load remains undetectable at at 12 weeks (SVR - cured). No further therapy is required.   Cheryl Hogan, PharmD PGY1 Ambulatory Care Resident

## 2019-05-16 NOTE — Assessment & Plan Note (Signed)
She continues to do well with Suboxone therapy. There is still some social stressors in her life and I do not think she is ready to taper yet. I'm in favor of continuing her on Suboxone 2 tablets daily. We'll check back in 4 weeks with telehealth visit.

## 2019-05-16 NOTE — Assessment & Plan Note (Signed)
We did spend some time today talking about her weight. She has gained about 60 pounds over the last year this has corresponded with her stopping using injection drugs, starting work in [employer] and having some instability of housing (living in long-term hotel). She was a bit shocked by how much weight she has gained she is interested in trying to lose weight we discussed healthy weight loss of a goal of 1 pound a week we discussed calorie reduction.

## 2019-05-16 NOTE — Progress Notes (Signed)
    Cheryl Hogan presents for follow up of opioid use disorder I have reviewed the prior induction visit, follow up visits, and telephone encounters relevant to opiate use disorder (OUD) treatment.   Current daily dose: Suboxone 8-2 mg BID  Date of Induction: 01/23/18  Current follow up interval, in weeks: 4 weeks  The patient has been adherent with the buprenorphine for OUD contract.   Last UDS Result: Appropriate for buprenorphine, ethanol metabolites also present.   HPI: She returns today to follow-up for her opioid use disorder. Overall she is done fairly well during the coronavirus pandemic. We have mostly done telehealth visits with occasional in person visits. She reports she has continued to stay busy with work. 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 She has she has been saving up some money her and her husband are planning to move out of the long-term hotel which they stay to an apartment next week she is very excited about this. As far as her opioid use disorder her cravings have continued to be well controlled. She denies any relapses and feels things are going much better in her life.  General: calm, no distress Pulm:CTAB Card: RRR Derm: no stigmata of recent injection Psych: non anxious  Assessment & Plan:   See Encounters Tab for problem based charting.

## 2019-05-19 LAB — TOXASSURE SELECT,+ANTIDEPR,UR

## 2019-06-18 MED FILL — BUPRENORPHIN-NALOXON 8-2 MG: 8-2 | 14 days supply | Qty: 28 | Fill #1

## 2019-06-23 IMAGING — CT CT CHEST W/O CM
2 of 3 series · 15 of 36 positions shown, 18 images · non-contrast
Comparison: Chest radiograph January 23, 2018

CLINICAL DATA: Chest pain and cough for 2 weeks. History of
intravenous drug abuse.

EXAM:
CT CHEST WITHOUT CONTRAST
TECHNIQUE: Multidetector CT imaging of the chest was performed following the
standard protocol without IV contrast.

[Series 3: chest w/o 2mm st · axial · non-contrast · 0.85mm/px · z∈[+1072,+1326]mm · 12 of 149 slices shown, 15 images]
[im 11/149  mediastinal]
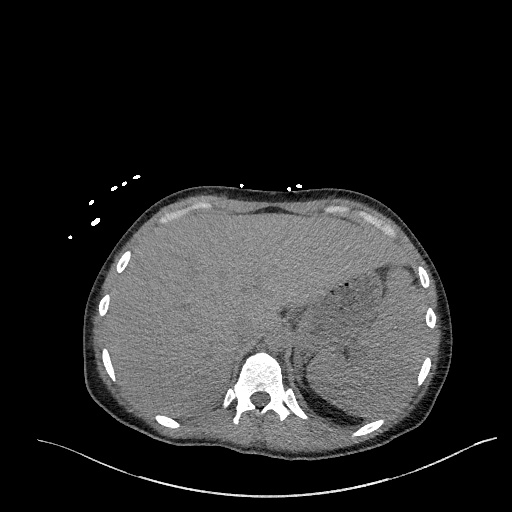
[im 11/149  lung]
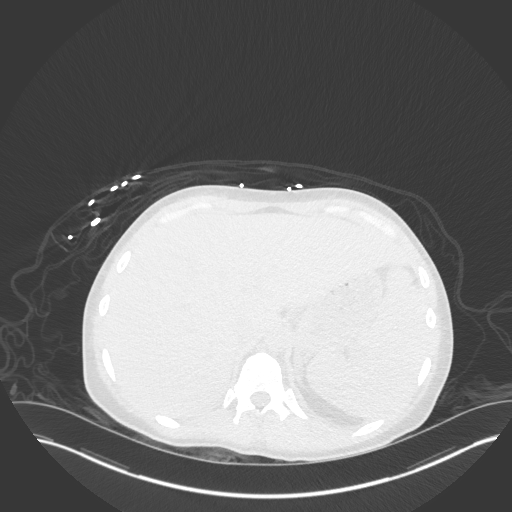
[im 22/149  lung]
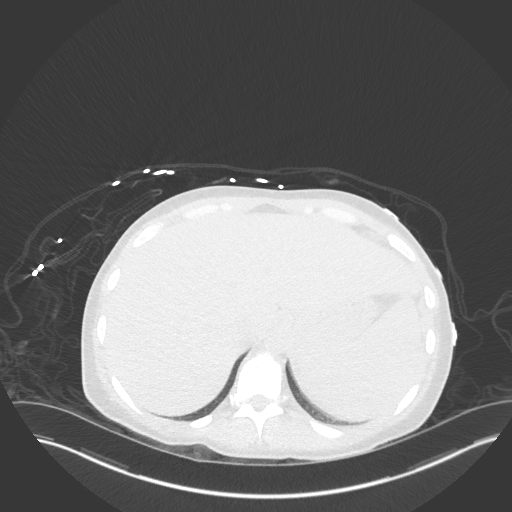
[im 33/149  lung]
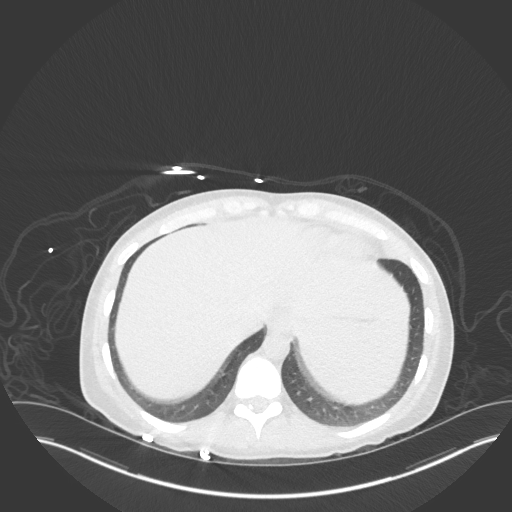
[im 44/149  lung]
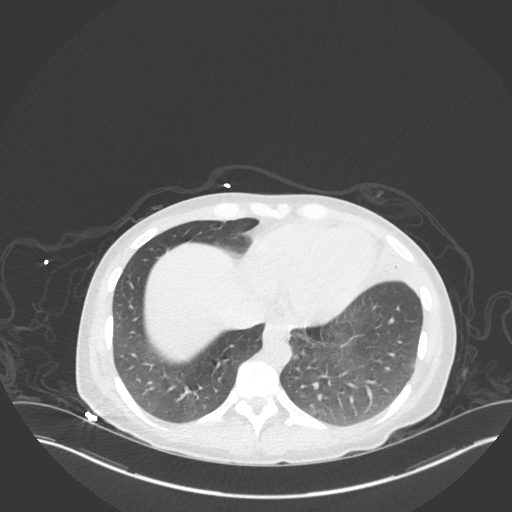
[im 55/149  mediastinal]
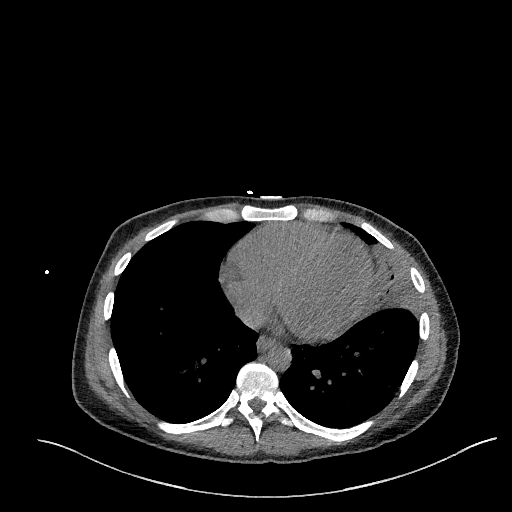
[im 55/149  lung]
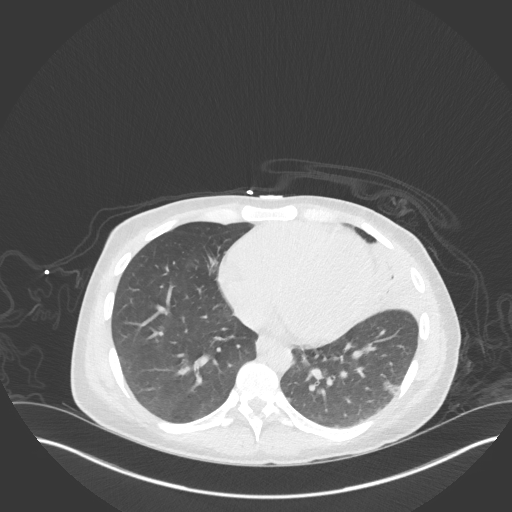
[im 66/149  lung]
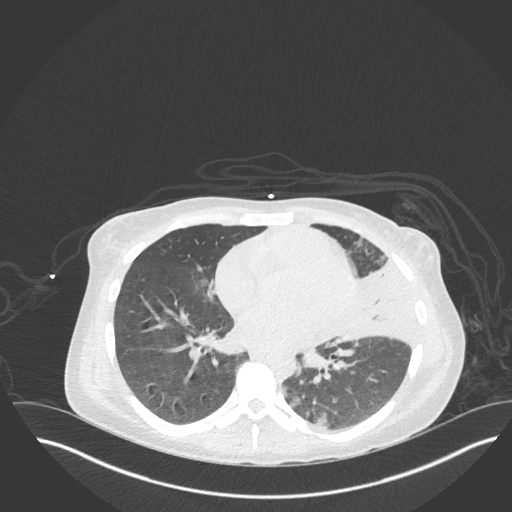
[im 83/149  lung]
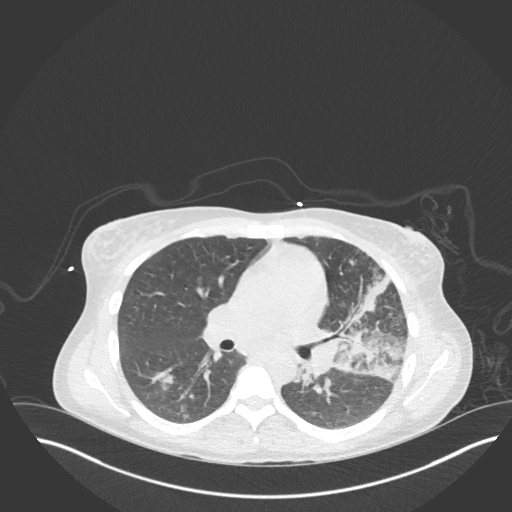
[im 94/149  lung]
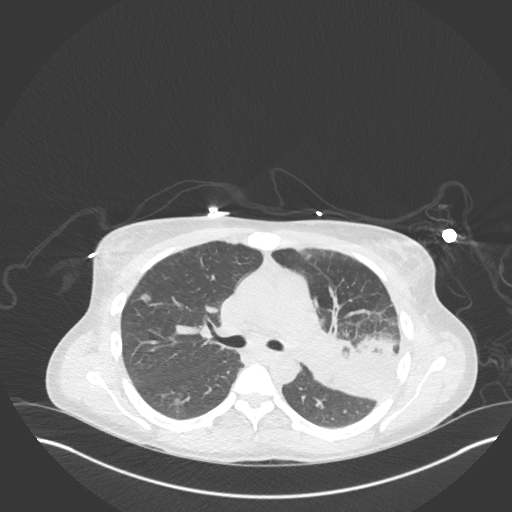
[im 105/149  mediastinal]
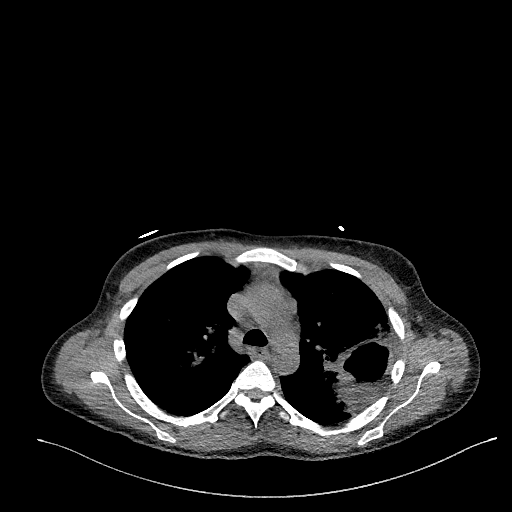
[im 105/149  lung]
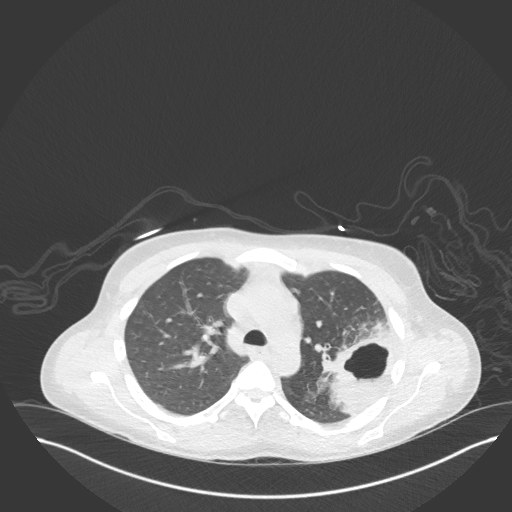
[im 116/149  lung]
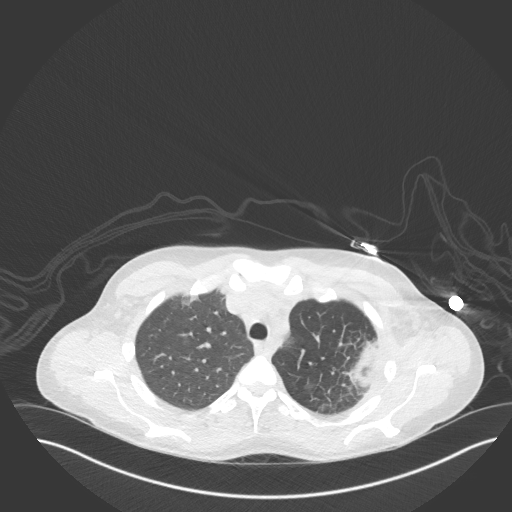
[im 127/149  lung]
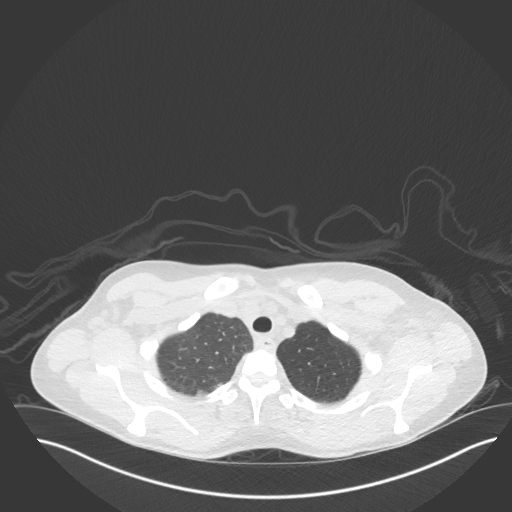
[im 138/149  lung]
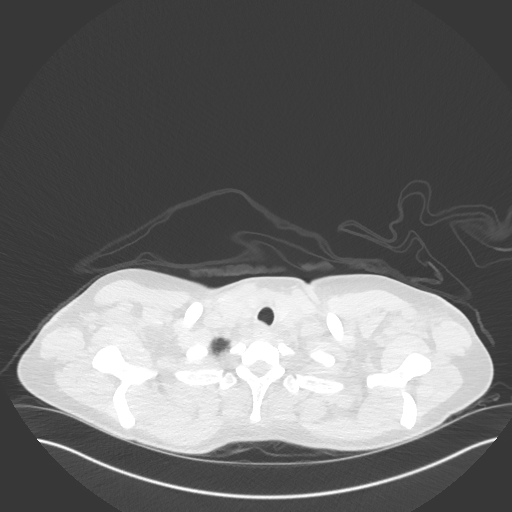

[Series 6: chest w/o 3mm st cor · coronal · non-contrast · 0.58mm/px · 3 of 77 slices shown]
[im 16/77  lung]
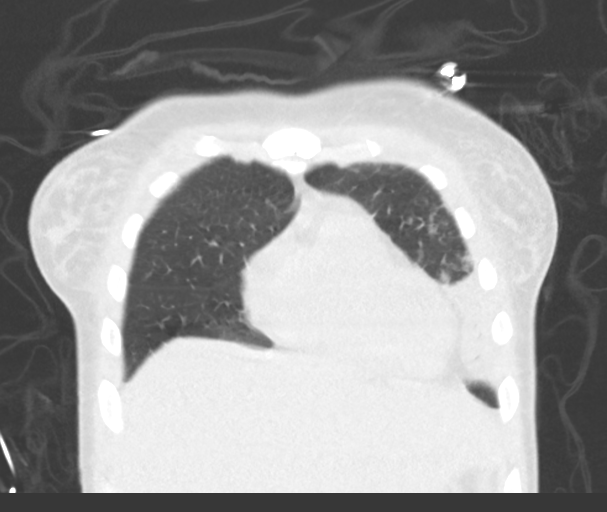
[im 31/77  lung]
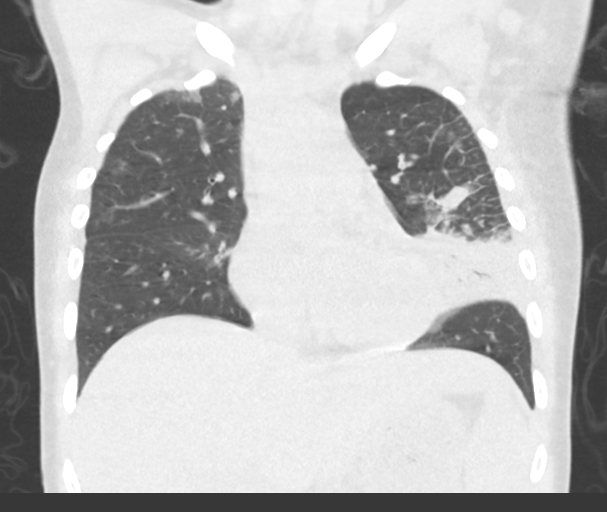
[im 46/77  lung]
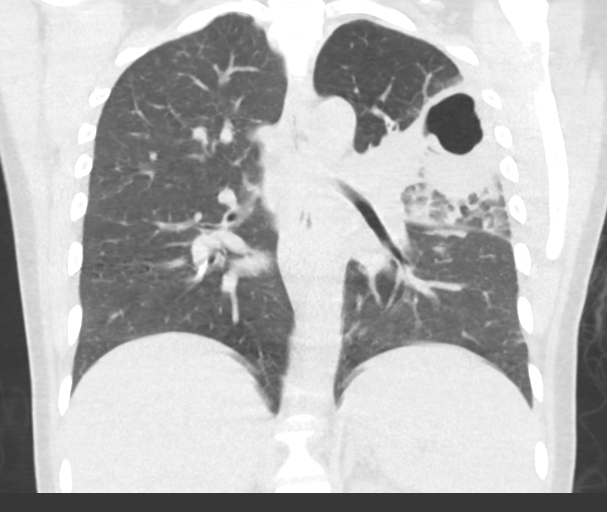

[15 of 36 positions shown; findings below may reference images not displayed]

FINDINGS: CARDIOVASCULAR: Heart and pericardium are unremarkable. Thoracic
aorta is normal course and caliber, unremarkable.

MEDIASTINUM/NODES: LEFT > RIGHT axillary lymphadenopathy with
pericapsular fat stranding. Limited assessment for mediastinal or
hilar lymphadenopathy by noncontrast CT.

LUNGS/PLEURA: Proximal tracheobronchial tree is patent, lingular
bronchus wall thickening and subsequent obstruction with multifocal
collapse. Multifocal consolidation LEFT upper lobe with dominant
cm air-filled consolidation LEFT upper lobe, apicoposterior segment.
Additional patchy ground-glass opacities/subsolid pulmonary nodules
LEFT lower lobe, and RIGHT upper and RIGHT lower lobes. No pleural
effusion.

UPPER ABDOMEN: Nonacute.

MUSCULOSKELETAL: Nonacute. Mild degenerative change of the thoracic
spine.
IMPRESSION: 1. Limited noncontrast CT chest.
2. Multifocal pneumonia with LEFT upper lobe air-containing
consolidation seen with atypical infection, abscess and septic
emboli.
3. Obstructed lingular bronchus with partial collapse.
4. Reactive LEFT > RIGHT axillary lymphadenopathy.

## 2019-06-29 IMAGING — DX DG CHEST 2V
2 series · 2 of 2 positions shown · non-contrast
Comparison: CT scan of January 24, 2018. Radiographs January 23, 2018.

CLINICAL DATA: Shortness of breath.  Septic embolism.

EXAM:
CHEST - 2 VIEW

[w chest pa]
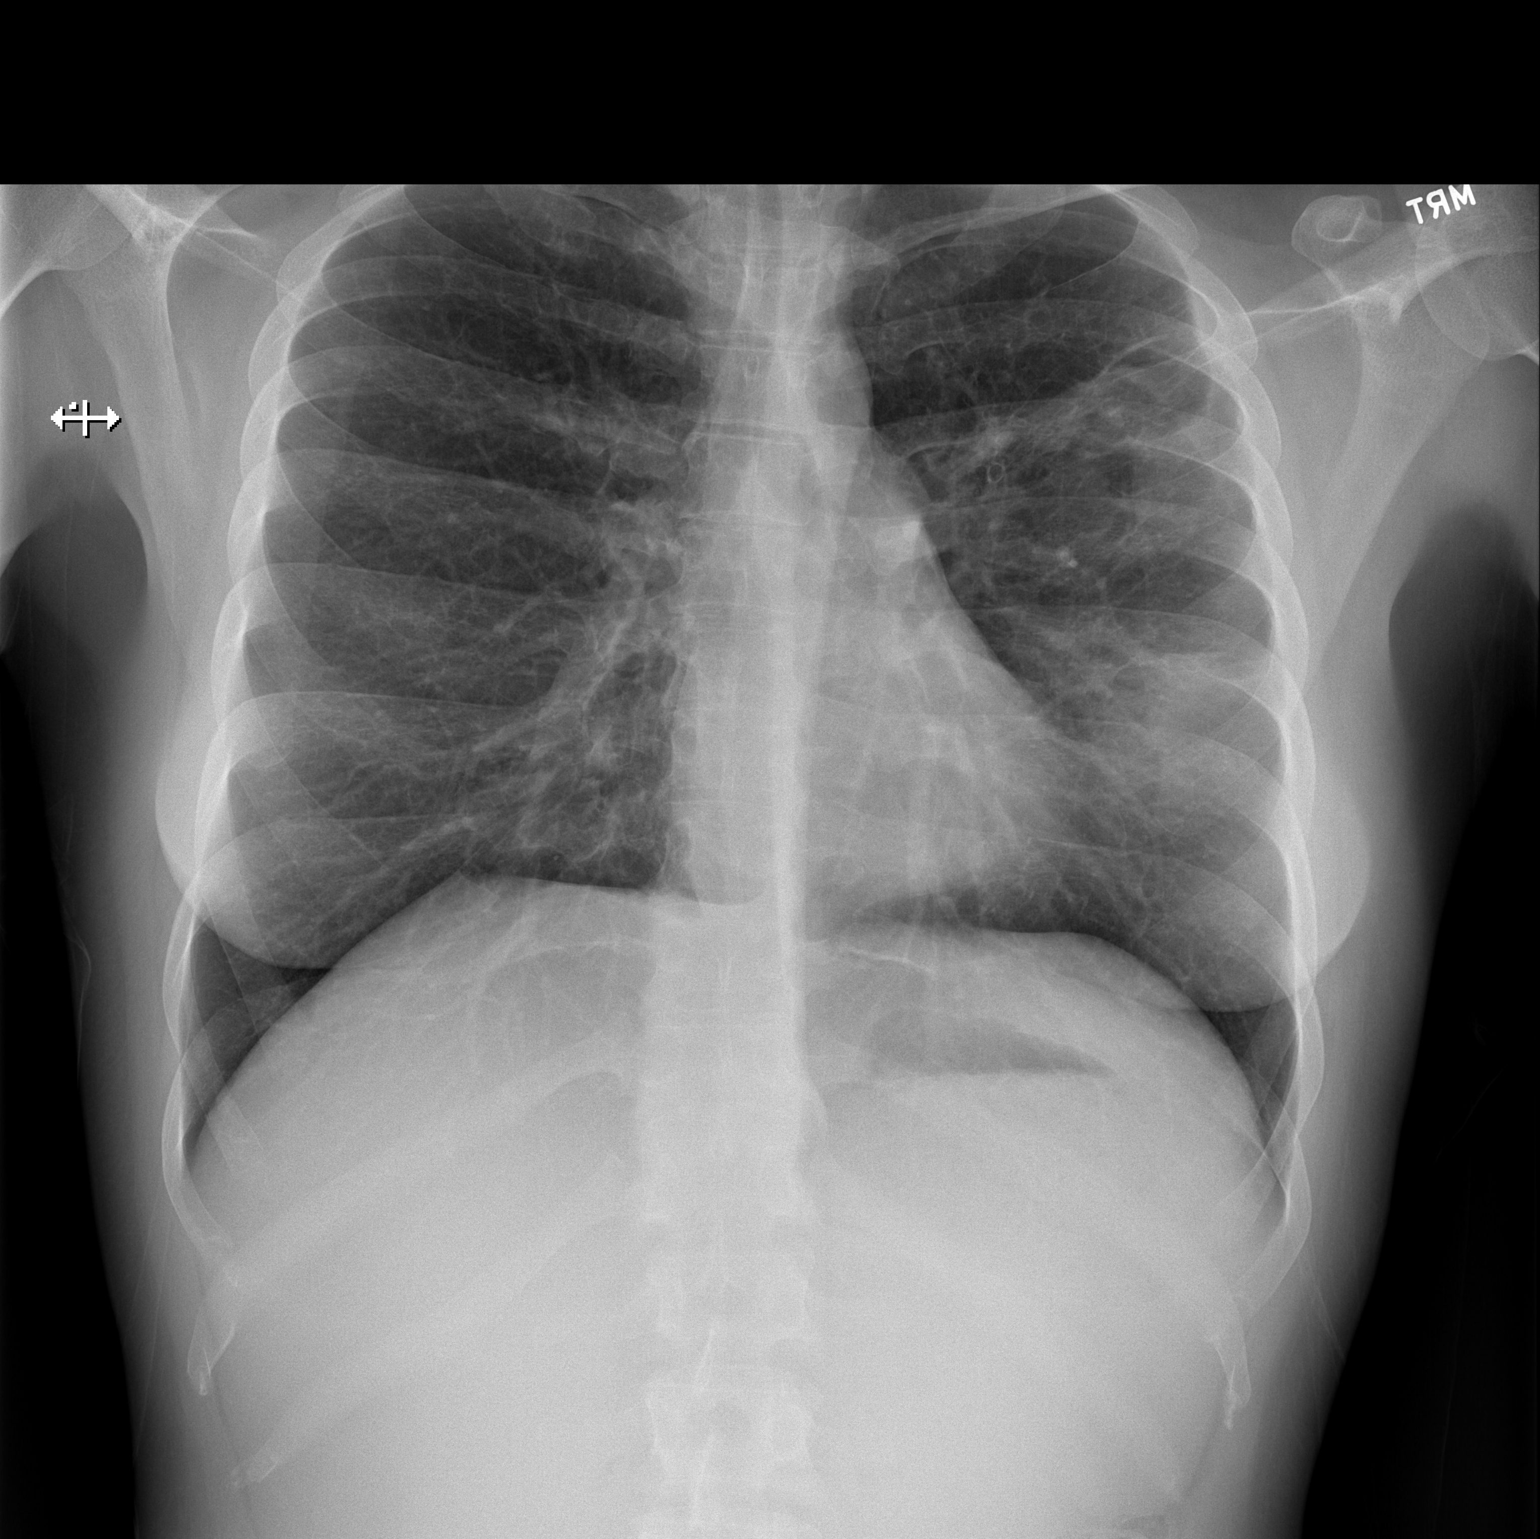

[w chest lat]
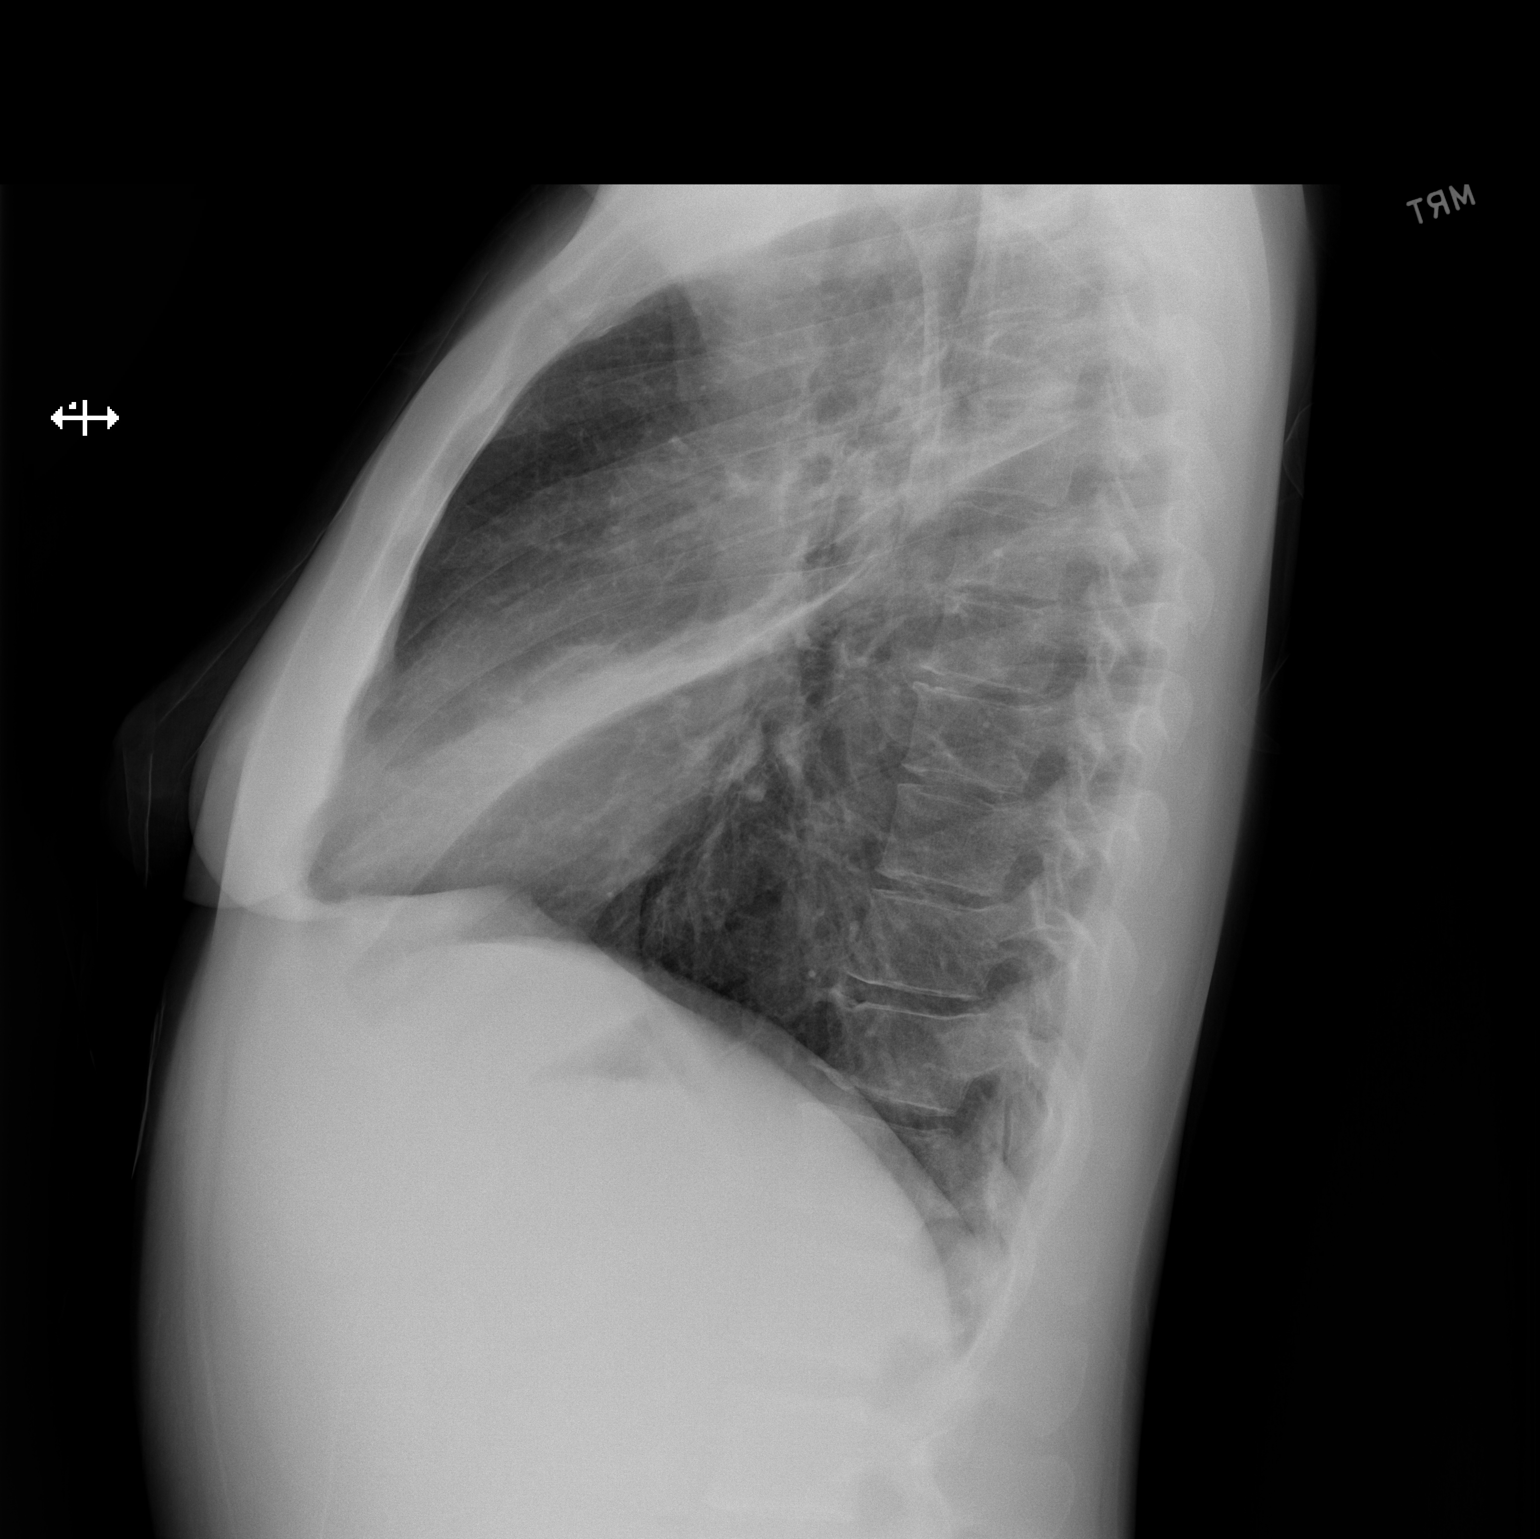

[2 of 2 positions shown; findings below may reference images not displayed]

FINDINGS: The heart size and mediastinal contours are within normal limits. No
pneumothorax or pleural effusion is noted. Right lung is clear.
Decreased left upper lobe opacity is noted suggesting improving
pneumonia. Continued presence of probable cavitary abnormality seen
in left upper lobe.. The visualized skeletal structures are
unremarkable.
IMPRESSION: Improving left upper lobe pneumonia with persistent cavitary
abnormality.

## 2019-07-16 ENCOUNTER — Other Ambulatory Visit: Payer: Self-pay | Admitting: Internal Medicine

## 2019-07-16 DIAGNOSIS — F1120 Opioid dependence, uncomplicated: Secondary | ICD-10-CM

## 2019-07-16 MED FILL — BUPRENORPHIN-NALOXON 8-2 MG: 8-2 | 14 days supply | Qty: 28 | Fill #0

## 2019-07-16 NOTE — Telephone Encounter (Signed)
Scheduled for 7/27 at 0845

## 2019-07-16 NOTE — Telephone Encounter (Signed)
Needs OUD appointment at next available, not seen since May. I will approve 2 weeks refill only.

## 2019-08-06 ENCOUNTER — Ambulatory Visit (INDEPENDENT_AMBULATORY_CARE_PROVIDER_SITE_OTHER): Payer: Self-pay | Admitting: Internal Medicine

## 2019-08-06 ENCOUNTER — Encounter: Payer: Self-pay | Admitting: Internal Medicine

## 2019-08-06 DIAGNOSIS — F1120 Opioid dependence, uncomplicated: Secondary | ICD-10-CM

## 2019-08-06 MED ORDER — BUPRENORPHINE HCL-NALOXONE HCL 8-2 MG SL SUBL: 1.0000 | SUBLINGUAL_TABLET | Freq: Two times a day (BID) | SUBLINGUAL | 1 refills | Status: DC

## 2019-08-06 MED FILL — BUPRENORPHIN-NALOXON 8-2 MG: 8-2 | 14 days supply | Qty: 28 | Fill #0

## 2019-08-06 NOTE — Assessment & Plan Note (Signed)
Mrs. Lanum states she is doing well with current Suboxone regimen. Denies any cravings, withdrawals or relapses.   Assessment/plan: Stable at this time. No medication changes planned.  -Suboxone 8-2 mg twice daily -4-week follow-up -UDS pending

## 2019-08-06 NOTE — Progress Notes (Signed)
   08/06/2019  Cheryl Hogan presents for follow up of opioid use disorder I have reviewed the prior induction visit, follow up visits, and telephone encounters relevant to opiate use disorder (OUD) treatment.   Current daily dose: Suboxone 8-2 mg twice daily  Date of Induction: 01/23/18  Current follow up interval, in weeks: 4  The patient has been adherent with the buprenorphine for OUD contract.   Last UDS Result: 05/14/19: Positive for buprenorphine, trazodone, ethanol metabolites  HPI:  Ms. Cheryl Hogan states that she is doing well today. She has had no difficulty with her Suboxone and is able to take it twice daily as prescribed. She denies any recent cravings, relapses or withdrawal symptoms. She denies any depression or anxiety, recent stressors in her home life.  Exam:   Vitals:   08/06/19 1221  BP: 106/69  Pulse: 71  Temp: 98.2 F (36.8 C)  TempSrc: Oral  SpO2: 99%  Weight: 208 lb 12.8 oz (94.7 kg)   Physical Exam Vitals and nursing note reviewed.  Constitutional:      General: She is not in acute distress.    Appearance: She is normal weight.  Skin:    General: Skin is warm and dry.  Neurological:     General: No focal deficit present.     Mental Status: She is alert and oriented to person, place, and time. Mental status is at baseline.  Psychiatric:        Mood and Affect: Mood is anxious.        Behavior: Behavior normal.    Assessment/Plan:  See Problem Based Charting in the Encounters Tab  Verdene Lennert, MD 08/06/2019  6:44 PM

## 2019-08-06 NOTE — Patient Instructions (Addendum)
It was nice seeing you today! Thank you for choosing [facility] for your Primary Care.    Today we talked about:   1. No medication changes were made today. Let's follow up in 4 weeks

## 2019-08-07 NOTE — Progress Notes (Signed)
Internal Medicine Clinic Attending  Case discussed with Dr. Huel Cote  At the time of the visit.  We reviewed the resident's history and exam and pertinent patient test results.  I agree with the assessment, diagnosis, and plan of care documented in the resident's note.   Please note, patient has been prescribed Trazodone for sleep PRN, last refill in March of this year.  So this is expected on her UDS.  She has been compliant with medication contract.  Follow up in 4 weeks.

## 2019-08-09 LAB — TOXASSURE SELECT,+ANTIDEPR,UR

## 2019-09-10 MED FILL — BUPRENORPHIN-NALOXON 8-2 MG: 8-2 | 14 days supply | Qty: 28 | Fill #1

## 2019-11-25 ENCOUNTER — Telehealth: Payer: Self-pay | Admitting: *Deleted

## 2019-11-25 NOTE — Telephone Encounter (Signed)
Yes, that sounds fine.

## 2019-11-25 NOTE — Telephone Encounter (Signed)
[place] cps calls r/t pt being in OUD clinic If pt calls could we give her a new appt, she has not been seen since 09/2019 [phone]

## 2019-12-10 ENCOUNTER — Ambulatory Visit (INDEPENDENT_AMBULATORY_CARE_PROVIDER_SITE_OTHER): Payer: Self-pay | Admitting: Internal Medicine

## 2019-12-10 ENCOUNTER — Other Ambulatory Visit: Payer: Self-pay | Admitting: Internal Medicine

## 2019-12-10 ENCOUNTER — Other Ambulatory Visit: Payer: Self-pay

## 2019-12-10 VITALS — BP 116/73 | HR 75 | Temp 98.1°F | Ht 69.0 in | Wt 215.4 lb

## 2019-12-10 DIAGNOSIS — Z79891 Long term (current) use of opiate analgesic: Secondary | ICD-10-CM

## 2019-12-10 DIAGNOSIS — F1120 Opioid dependence, uncomplicated: Secondary | ICD-10-CM

## 2019-12-10 MED ORDER — BUPRENORPHINE HCL-NALOXONE HCL 8-2 MG SL SUBL: 1.0000 | SUBLINGUAL_TABLET | Freq: Two times a day (BID) | SUBLINGUAL | 0 refills | Status: DC

## 2019-12-10 MED FILL — BUPRENORPHIN-NALOXON 8-2 MG: 8-2 | 14 days supply | Qty: 28 | Fill #0

## 2019-12-10 NOTE — Patient Instructions (Addendum)
Ms. Clarey - -  Please start back to taking Buprenorphine-naloxone (suboxone) 8-2mg  twice a day.   Please come back to see the clinic in 2 weeks.   Thank you.

## 2019-12-10 NOTE — Assessment & Plan Note (Signed)
Cheryl Hogan has not been seen in our clinic since August.  She got her last refill of buprenorphine-naloxone on 09/10/2019.  She has been self weaning.  However, she noted worsening symptoms and cravings and so she "got herself back here" to get started back on therapy.  She has not relapsed, but is concerned about relapsing.  We discussed possibly taking a lower dose of buprenorphine-naloxone, perhaps 4-1mg  BID, but she feels that she will still feel sick and she would prefer to go back to previous dosing.     Plan PDMP reviewed and appropriate UDS today Suboxone 8-2mg  tablets BID She would likely do well with a monitored wean, as she is interested in this process.  However, will need to continue discussing with her and plan for a prolonged wean.

## 2019-12-10 NOTE — Progress Notes (Signed)
   12/10/2019  Cheryl Hogan presents for follow up of opioid use disorder I have reviewed the prior induction visit, follow up visits, and telephone encounters relevant to opiate use disorder (OUD) treatment.   Current daily dose: Unknown  Date of Induction: 01/23/18  Current follow up interval, in weeks: 1 week will be needed  Last UDS Result: NA, UDS done today.   HPI: Cheryl Hogan is a 34 year old woman who was previously following with Cheryl Hogan for severe opioid use disorder.  She was last seen in our clinic in August of this year.  We were requested to see her by [facility] CPS.  Today, Cheryl Hogan reports that she has been actively trying to wean herself off of buprenorphine. She used her last Rx in 09/10/19 to wean herself down to 1/4 of a tablet daily.  However, over the past few weeks, she has had an increase in cravings and withdrawal symptoms including sweating, restlessness, GI upset, tremor and anxiety.  She was worried about relapsing and so decided to come back to clinic.  She would prefer to go back to her previous dose of 8-2mg  BID as she is daily sick and worried about relapsing.  She took her last 1/4 tablet today (2mg  dose).  She notes that she has been 2 years clean at this point.   When asked about CPS, she became closed off and noted that she has had an open case with CPS for 3 years.  She notes the stress of that situation has made her anxiety and cravings worse.  She denies relapse or recent injection use.   Exam:   Vitals:   12/10/19 0907  BP: 116/73  Pulse: 75  Temp: 98.1 F (36.7 C)  TempSrc: Oral  SpO2: 99%  Weight: 215 lb 6.4 oz (97.7 kg)  Height: 5\' 9"  (1.753 m)    General: Restless, flat affect CV: RR, NR, no murmur noted.  Skin: No gooseflesh Neuro: Fine tremor to hands, otherwise alert and oriented Psych: Flat, defensive, restless.   Assessment/Plan:  See Problem Based Charting in the Encounters Tab     14/07/21, MD  12/10/2019  9:25 AM

## 2019-12-18 LAB — TOXASSURE SELECT,+ANTIDEPR,UR

## 2019-12-24 ENCOUNTER — Telehealth: Payer: Self-pay | Admitting: *Deleted

## 2019-12-24 NOTE — Telephone Encounter (Signed)
Call placed to patient for today's missed appt. No answer. Left message on VM requesting return call. L. Alexcia Schools, BSN, RN-BC  

## 2020-01-10 ENCOUNTER — Telehealth (HOSPITAL_COMMUNITY): Payer: No Payment, Other | Admitting: Psychiatry

## 2020-01-10 ENCOUNTER — Other Ambulatory Visit: Payer: Self-pay

## 2020-01-22 ENCOUNTER — Telehealth: Payer: Self-pay

## 2020-01-22 NOTE — Telephone Encounter (Signed)
Received TC from Synthia Innocent who states she is with the [facility] department.  She states she spoke to Dorris earlier today and has some additional questions regarding the patient.  States she is trying to update the court report, patient is attempting to get her child back and has court next week.  She is requesting a call on her cell phone at [phone] and states this is the best number to contact her. RN informed Ms. Georgina Pillion information will be forwarded to Print production planner, Dorris. SChaplin, RN,BSN

## 2020-01-23 NOTE — Telephone Encounter (Signed)
Received call from Synthia Innocent, states she's pt's SW. Stated she needs to update pt's file/ pt will be going to court next Friday - wanted to know at pt's last visit on 12/10/19 did she receive a rx for suboxone. Informed her suboxone was prescribed on 12/10/19.

## 2020-02-18 ENCOUNTER — Other Ambulatory Visit: Payer: Self-pay | Admitting: Internal Medicine

## 2020-02-18 ENCOUNTER — Ambulatory Visit (INDEPENDENT_AMBULATORY_CARE_PROVIDER_SITE_OTHER): Payer: Self-pay | Admitting: Internal Medicine

## 2020-02-18 VITALS — BP 119/72 | HR 75 | Temp 98.4°F | Wt 211.3 lb

## 2020-02-18 DIAGNOSIS — G4700 Insomnia, unspecified: Secondary | ICD-10-CM | POA: Insufficient documentation

## 2020-02-18 DIAGNOSIS — F5101 Primary insomnia: Secondary | ICD-10-CM

## 2020-02-18 DIAGNOSIS — F1120 Opioid dependence, uncomplicated: Secondary | ICD-10-CM

## 2020-02-18 MED ORDER — TRAZODONE HCL 50 MG PO TABS: 50.0000 mg | ORAL_TABLET | Freq: Every evening | ORAL | 2 refills | Status: DC | PRN

## 2020-02-18 MED ORDER — BUPRENORPHINE HCL-NALOXONE HCL 8-2 MG SL SUBL: 1.0000 | SUBLINGUAL_TABLET | Freq: Two times a day (BID) | SUBLINGUAL | 0 refills | Status: DC

## 2020-02-18 MED FILL — traZODone HCL 50 MG TABS: 50 | 15 days supply | Qty: 30 | Fill #0

## 2020-02-18 MED FILL — BUPRENORPHIN-NALOXON 8-2 MG: 8-2 | 14 days supply | Qty: 28 | Fill #0

## 2020-02-18 NOTE — Progress Notes (Signed)
   02/18/2020  Cheryl Hogan presents for follow up of opioid use disorder I have reviewed the prior induction visit, follow up visits, and telephone encounters relevant to opiate use disorder (OUD) treatment.   Current daily dose: Suboxone 8-2 mg twice daily  Date of Induction: 01/23/18  Current follow up interval, in weeks: 2 week  Last UDS Result: N/A  HPI: This is a 35 year old with a history of OUD who presented for follow up of his OUD. She has not been seen in our clinic since December reports she had COVID-19 pneumonia. She was reinfected by her husband and had a second episode. She reports she has been adherent to Suboxone therapy and rationed her Suboxone until now. She reports her cravings have not been controlled on the lower dosage of Suboxone. Denies any relapse.  She does not have a PCP since the clinic she was previously seen in lost funding. She will establish with our clinic for primary care.   Exam:   Vitals:   02/18/20 0934  BP: 119/72  Pulse: 75  Temp: 98.4 F (36.9 C)  TempSrc: Oral  SpO2: 97%  Weight: 211 lb 4.8 oz (95.8 kg)    General: appear restless, NAD CV: RRR, no murmurs , rubs,or gallops Pulm: Normal effort, equal breath sounds, no adventitious breath sounds Skin: Warm, dry,well perfused Ext: no lower extremity edema, no deformity    Assessment/Plan:  See Problem Based Charting in the Encounters Tab     Albertha Ghee, MD  02/18/2020  9:44 AM

## 2020-02-18 NOTE — Progress Notes (Signed)
Internal Medicine Clinic Attending  Case discussed with Dr. Steen  At the time of the visit.  We reviewed the resident's history and exam and pertinent patient test results.  I agree with the assessment, diagnosis, and plan of care documented in the resident's note.  

## 2020-02-18 NOTE — Assessment & Plan Note (Signed)
Patient's care has not been regular. She was seen in August 2021, then December 2021, and now presents today. Her recent capping care with secondary to COVID-19 pneumonia x 2. We discussed our clinic has telephone appointments available and recommended requesting this if she is unable to keep in person appointment. She denies any relapse and has rationed her Suboxone prescribed at last appointment. Overall she continues to maintain her job and relationships. She has had increased cravings and not interested in lower dose of Suboxone anymore.  - buprenorphine-naloxone (SUBOXONE) 8-2 mg SUBL SL tablet; Place 1 tablet under the tongue 2 (two) times daily.  Dispense: 28 tablet; Refill: 0 -Follow-up in 2 weeks, in person visit - UDS at next visit

## 2020-02-18 NOTE — Patient Instructions (Signed)

## 2020-02-18 NOTE — Assessment & Plan Note (Signed)
Patient reports a history of insomnia. She works as a Physiological scientist. She reports a difficult time falling asleep once she comes home. This has been controlled on trazodone in the past.   - traZODone (DESYREL) 50 MG tablet; Take 1-2 tablets (50-100 mg total) by mouth at bedtime as needed.  Dispense: 30 tablet; Refill: 2

## 2020-03-10 ENCOUNTER — Other Ambulatory Visit: Payer: Self-pay

## 2020-03-10 DIAGNOSIS — F1120 Opioid dependence, uncomplicated: Secondary | ICD-10-CM

## 2020-03-10 NOTE — Telephone Encounter (Signed)
Requesting an OUD appt. Please call pt back.

## 2020-03-10 NOTE — Telephone Encounter (Signed)
Returned call to patient. States she thought an appt was scheduled for her at her last OV on 02/18/2020. States she did not realize she had to make her own appt. Given first available OUD appt on 03/24/2020 at 0945. She is requesting refill on suboxone till then.

## 2020-03-11 ENCOUNTER — Other Ambulatory Visit: Payer: Self-pay | Admitting: Internal Medicine

## 2020-03-11 MED ORDER — BUPRENORPHINE HCL-NALOXONE HCL 8-2 MG SL SUBL: 1.0000 | SUBLINGUAL_TABLET | Freq: Two times a day (BID) | SUBLINGUAL | 0 refills | Status: DC

## 2020-03-11 MED FILL — BUPRENORPHIN-NALOXON 8-2 MG: 8-2 | 14 days supply | Qty: 28 | Fill #0

## 2020-03-13 ENCOUNTER — Telehealth: Payer: Self-pay

## 2020-03-13 NOTE — Telephone Encounter (Signed)
Audria Nine( Guardian of Litem for  pt's daughter)with [place] requesting to speak with a nurse about screening for Suboxone on the patient.  Please call back @ [phone].

## 2020-03-16 ENCOUNTER — Telehealth: Payer: Self-pay

## 2020-03-16 NOTE — Telephone Encounter (Signed)
[name] pt daughter [phone] called to ask question regarding pt

## 2020-03-16 NOTE — Telephone Encounter (Signed)
Pls contact Zella Ball from [facility] [phone] regarding pt above

## 2020-03-16 NOTE — Telephone Encounter (Signed)
Daughter is a minor and is not listed on patient's chart. No information can be given to her. Call placed to daughter to inform her of this. No answer, and VM is unidentified. No message left.

## 2020-03-16 NOTE — Telephone Encounter (Signed)
Returned call to Audria Nine, Guardian ad Litem for pt's daughter. She asked why Toxassure was not done at last OV. Explained that it is at the discretion of the OUD Provider when UDS is performed unless it is a requirement of the court. She states it is not a requirement but she is concerned that alcohol was found in the last UDS. Also, wanted to know why patient did not f/u on 03/03/2020 as requested. Explained patient called in on 03/10/2020 stating she did not realize an appt was not scheduled for her and first available was 03/24/2020. Enough suboxone was sent to pharmacy on 03/10/2020 to get her to appt on 03/24/2020. Zella Ball was very Adult nurse.

## 2020-03-24 ENCOUNTER — Encounter: Payer: Self-pay | Admitting: Internal Medicine

## 2020-03-24 ENCOUNTER — Other Ambulatory Visit: Payer: Self-pay | Admitting: Internal Medicine

## 2020-03-24 ENCOUNTER — Other Ambulatory Visit: Payer: Self-pay

## 2020-03-24 ENCOUNTER — Encounter (HOSPITAL_COMMUNITY): Payer: Self-pay | Admitting: Psychiatry

## 2020-03-24 ENCOUNTER — Telehealth (INDEPENDENT_AMBULATORY_CARE_PROVIDER_SITE_OTHER): Payer: No Payment, Other | Admitting: Psychiatry

## 2020-03-24 ENCOUNTER — Ambulatory Visit (INDEPENDENT_AMBULATORY_CARE_PROVIDER_SITE_OTHER): Payer: Self-pay | Admitting: Internal Medicine

## 2020-03-24 DIAGNOSIS — F325 Major depressive disorder, single episode, in full remission: Secondary | ICD-10-CM

## 2020-03-24 DIAGNOSIS — F1121 Opioid dependence, in remission: Secondary | ICD-10-CM

## 2020-03-24 DIAGNOSIS — F1120 Opioid dependence, uncomplicated: Secondary | ICD-10-CM

## 2020-03-24 MED ORDER — BUPRENORPHINE HCL-NALOXONE HCL 8-2 MG SL SUBL: 1.0000 | SUBLINGUAL_TABLET | Freq: Two times a day (BID) | SUBLINGUAL | 0 refills | Status: DC

## 2020-03-24 MED FILL — BUPRENORPHIN-NALOXON 8-2 MG: 8-2 | 14 days supply | Qty: 28 | Fill #0

## 2020-03-24 NOTE — Progress Notes (Signed)
Psychiatric Initial Adult Assessment  Virtual Visit via Telephone Note  I connected with Cheryl Hogan on 03/24/20 at  2:00 PM EDT by telephone and verified that I am speaking with the correct person using two identifiers.  Location: Patient: home Provider: Clinic   I discussed the limitations, risks, security and privacy concerns of performing an evaluation and management service by telephone and the availability of in person appointments. I also discussed with the patient that there may be a patient responsible charge related to this service. The patient expressed understanding and agreed to proceed.   I provided 45 minutes of non-face-to-face time during this encounter.   Patient Identification: Cheryl Hogan MRN:  [id number] Date of Evaluation:  03/24/2020 Referral Source: Walk-in Chief Complaint:  "I need a psychiatric evaluation" Visit Diagnosis:    ICD-10-CM   1. 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 Opioid dependence in remission [facility])  F11.21     History of Present Illness: 35 year old female seen today for initial psychiatric evaluation.  She walked into the clinic for psychiatric evaluation.  She informed provider that social services requested she have a psychiatric evaluation.  She has a psychiatric history of depression, anxiety, and opioid dependence (in remission for 2 years), and ADD.  She is currently managed on Suboxone and trazodone 50 mg as needed which she receives from the Suboxone clinic.  Today patient was unable to log on virtually so her assessment was done over the phone.  During exam she was pleasant, cooperative, and engaged in conversation.  She informed provider that her 58-year-old daughter is in the custody of social services and she notes that they requested that she has a psychiatric eval.  She notes that she would like to regain custody of her daughter and is checking things off that social services requested.  She informed provider that she  has been sober from opioids for over 2 years.  She denies other illegal drug use.  She does endorse smoking a pack of cigarettes a day.  Patient denies symptoms of anxiety and depression.  Provider conducted a GAD-7 and a PHQ-9 and patient scored a 0 on both.  She also denies SI/HI/VAH, mania, or paranoia.  She endorses adequate sleep with the use of trazodone (noting she sleeps 6-8 hours nightly).   No medication changes made today.  Patient notes that she will have her trazodone filled at the Suboxone clinic.  She will follow-up with provider as needed.  No other concerns noted at this time.  Associated Signs/Symptoms: Depression Symptoms:  Denies (Hypo) Manic Symptoms:  Denies Anxiety Symptoms:  Denies Psychotic Symptoms:  Denies PTSD Symptoms: NA  Past Psychiatric History: ADD, Anxiety, depression, and opioid dependence (in remission for 2 years)  Previous Psychotropic Medications: Nicoderm CQ patch, Suboxone and trazodone  Substance Abuse History in the last 12 months:  No.  Consequences of Substance Abuse: NA  Past Medical History:  Past Medical History:  Diagnosis Date  . Abscess of right hand 01/24/2018  . ADD (attention deficit disorder)   . Anxiety   . Cellulitis of left hand   . Depression   . Injection of illicit drug within last 12 months   . Opioid dependence on agonist therapy (HCC)   . Psoriasis   . Pulmonary abscess (HCC) 01/24/2018    Past Surgical History:  Procedure Laterality Date  . I & D EXTREMITY Bilateral 01/24/2018   Procedure: IRRIGATION AND DEBRIDEMENT OF HAND;  Surgeon: Bradly Bienenstock, MD;  Location:  [facility] OR;  Service: Orthopedics;  Laterality: Bilateral;  . TEE WITHOUT CARDIOVERSION N/A 01/29/2018   Procedure: TRANSESOPHAGEAL ECHOCARDIOGRAM (TEE);  Surgeon: Thurmon Fair, MD;  Location: [facility] ENDOSCOPY;  Service: Cardiovascular;  Laterality: N/A;    Family Psychiatric History: Unknown  Family History:  Family History  Problem Relation Age of Onset   . Liver cancer Neg Hx   . Liver disease Neg Hx     Social History:   Social History   Socioeconomic History  . Marital status: Married    Spouse name: Not on file  . Number of children: Not on file  . Years of education: Not on file  . Highest education level: Not on file  Occupational History  . Not on file  Tobacco Use  . Smoking status: Current Every Day Smoker    Packs/day: 0.40    Types: Cigarettes  . Smokeless tobacco: Never Used  . Tobacco comment: .5 PPD  Substance and Sexual Activity  . Alcohol use: Not Currently  . Drug use: Not Currently    Comment: Last used IV heroin January 2020  . Sexual activity: Not Currently  Other Topics Concern  . Not on file  Social History Narrative  . Not on file   Social Determinants of Health   Financial Resource Strain: Not on file  Food Insecurity: Not on file  Transportation Needs: Not on file  Physical Activity: Not on file  Stress: Not on file  Social Connections: Not on file    Additional Social History: Patient resides in [place] with her husband.  She has 5 year old daughter who is in the custody of social services. She works at [employer]. She denies alcohol or illegal drug use. She smokes one pack of cigarettes a day.   Allergies:  No Known Allergies  Metabolic Disorder Labs: No results found for: HGBA1C, MPG No results found for: PROLACTIN No results found for: CHOL, TRIG, HDL, CHOLHDL, VLDL, LDLCALC No results found for: TSH  Therapeutic Level Labs: No results found for: LITHIUM No results found for: CBMZ No results found for: VALPROATE  Current Medications: Current Outpatient Medications  Medication Sig Dispense Refill  . buprenorphine-naloxone (SUBOXONE) 8-2 mg SUBL SL tablet Place 1 tablet under the tongue 2 (two) times daily. 28 tablet 0  . traZODone (DESYREL) 50 MG tablet Take 1-2 tablets (50-100 mg total) by mouth at bedtime as needed. 30 tablet 2  . triamcinolone ointment (KENALOG)  0.1 % Apply 1 application topically 2 (two) times daily. 80 g 1   No current facility-administered medications for this visit.    Musculoskeletal: Strength & Muscle Tone: Unable to assess due to telephone visit Gait & Station: Unable to assess due to telephone visit Patient leans: N/A  Psychiatric Specialty Exam: Review of Systems  There were no vitals taken for this visit.There is no height or weight on file to calculate BMI.  General Appearance: Unable to assess due to telephone visit  Eye Contact:  Unable to assess due to telephone visit  Speech:  Clear and Coherent and Normal Rate  Volume:  Normal  Mood:  Euthymic  Affect:  Appropriate and Congruent  Thought Process:  Coherent, Goal Directed and Linear  Orientation:  Full (Time, Place, and Person)  Thought Content:  WDL and Logical  Suicidal Thoughts:  No  Homicidal Thoughts:  No  Memory:  Immediate;   Good Recent;   Good Remote;   Good  Judgement:  Good  Insight:  Good  Psychomotor Activity:  Normal  Concentration:  Concentration: Good and Attention Span: Good  Recall:  Good  Fund of Knowledge:Good  Language: Good  Akathisia:  No  Handed:  Right  AIMS (if indicated):  Not done  Assets:  Communication Skills Desire for Improvement Financial Resources/Insurance Housing Leisure Time Social Support  ADL's:  Intact  Cognition: WNL  Sleep:  Good   Screenings: GAD-7   Flowsheet Row Video Visit from 03/24/2020 in [facility] Office Visit from 08/14/2018 in [facility] Office Visit from 03/06/2018 in [facility] Office Visit from 02/06/2018 in [facility]  Total GAD-7 Score 0 19 12 17     PHQ2-9   Flowsheet Row Video Visit from 03/24/2020 in [facility] Office Visit from 02/18/2020 in [facility] Office Visit from 12/10/2019 in [facility] Office Visit  from 08/06/2019 in [facility] Office Visit from 05/14/2019 in [facility]  PHQ-2 Total Score 0 0 0 0 0  PHQ-9 Total Score 0 -- 8 16 12     Flowsheet Row Video Visit from 03/24/2020 in [facility]  C-SSRS RISK CATEGORY No Risk      Assessment and Plan: Patient notes that she is currently on Suboxone and trazodone.  She notes that she receives trazodone from her Suboxone clinic and does not need provider to fill it.  Today she wanted a psych evaluation which was a requirement for social services as her child is in the custody of social services.  No medications added today.  Patient will follow up as needed.  1. Depression, major, in remission (HCC)   2. Opioid dependence in remission [facility])  Follow-up as needed    [facility], NP 3/22/20222:27 PM

## 2020-03-24 NOTE — Assessment & Plan Note (Signed)
Cheryl Hogan presented for a telehealth appointment due to her car breaking down on the way to her physical appointment. Patient states that she is doing well on her current regimen. She denies any relapse, but does endorse cravings as she continues to self wean on her suboxone. She states that she is also experiencing some diarrhea which she attributes to self weaning her suboxone. She would like to continue with her Suboxone 8-2 mg BID and is currently out of her medication.  - Refill Suboxone 8-2 mg BID - F/U in 2 weeks - PDMP reviewed and appropriate

## 2020-03-24 NOTE — Progress Notes (Signed)
  [facility] Internal Medicine Residency Telephone Encounter Continuity Care Appointment  HPI:   This telephone encounter was created for Ms. Cheryl Hogan on 03/24/2020 for the following purpose/cc OUD.   Past Medical History:  Past Medical History:  Diagnosis Date  . Abscess of right hand 01/24/2018  . ADD (attention deficit disorder)   . Anxiety   . Cellulitis of left hand   . Depression   . Injection of illicit drug within last 12 months   . Opioid dependence on agonist therapy (HCC)   . Psoriasis   . Pulmonary abscess (HCC) 01/24/2018      ROS:  Review of Systems  Constitutional: Negative for chills, diaphoresis, fever and weight loss.  Cardiovascular: Negative for chest pain and palpitations.  Gastrointestinal: Positive for diarrhea. Negative for abdominal pain, blood in stool, constipation, melena, nausea and vomiting.  Musculoskeletal: Negative for myalgias.     Assessment / Plan / Recommendations:   Please see A&P under problem oriented charting for assessment of the patient's acute and chronic medical conditions.   As always, pt is advised that if symptoms worsen or new symptoms arise, they should go to an urgent care facility or to to ER for further evaluation.   Consent and Medical Decision Making:   Patient discussed with Dr. Criselda Peaches  This is a telephone encounter between Cheryl Hogan and Dolan Amen on 03/24/2020 for OUD. The visit was conducted with the patient located at home and Dolan Amen at [facility]. The patient's identity was confirmed using their DOB and current address. The patient has consented to being evaluated through a telephone encounter and understands the associated risks (an examination cannot be done and the patient may need to come in for an appointment) / benefits (allows the patient to remain at home, decreasing exposure to coronavirus). I personally spent 12 minutes on medical discussion.

## 2020-03-31 NOTE — Progress Notes (Signed)
Internal Medicine Clinic Attending  Case discussed with Dr. Winters  At the time of the visit.  We reviewed the resident's history and pertinent patient test results.  I agree with the assessment, diagnosis, and plan of care documented in the resident's note.  

## 2020-04-07 ENCOUNTER — Ambulatory Visit (INDEPENDENT_AMBULATORY_CARE_PROVIDER_SITE_OTHER): Payer: Self-pay | Admitting: Internal Medicine

## 2020-04-07 ENCOUNTER — Other Ambulatory Visit (HOSPITAL_COMMUNITY): Payer: Self-pay

## 2020-04-07 ENCOUNTER — Other Ambulatory Visit: Payer: Self-pay

## 2020-04-07 VITALS — BP 102/68 | HR 75 | Temp 97.8°F | Ht 69.0 in | Wt 210.9 lb

## 2020-04-07 DIAGNOSIS — F1120 Opioid dependence, uncomplicated: Secondary | ICD-10-CM

## 2020-04-07 MED ORDER — BUPRENORPHINE HCL-NALOXONE HCL 8-2 MG SL SUBL
1.0000 | SUBLINGUAL_TABLET | Freq: Two times a day (BID) | SUBLINGUAL | 0 refills | Status: DC
  Filled 2020-04-07: qty 28, 14d supply, fill #0

## 2020-04-07 MED FILL — Trazodone HCl Tab 50 MG: ORAL | 15 days supply | Qty: 30 | Fill #0 | Status: AC

## 2020-04-07 NOTE — Progress Notes (Signed)
   04/07/2020  Cheryl Hogan presents for follow up of opioid use disorder I have reviewed the prior induction visit, follow up visits, and telephone encounters relevant to opiate use disorder (OUD) treatment.   Current daily dose: Suboxone 8-2mg  twice daily   Date of Induction: 01/23/18  Current follow up interval, in weeks: 2 weeks   The patient has been adherent with the buprenorphine for OUD contract.   Last UDS Result: 12/10/2019, appropriate  HPI: Ms Cheryl Hogan is a 35 year old female with PMhx of opiate use disorder presenting for follow up of her opioid use. She notes she has been doing well overall. Denies any acute concerns at this time. She denies any cravings or withdrawal symptoms on current dosing.   Exam:   Vitals:   04/07/20 1012  BP: 102/68  Pulse: 75  Temp: 97.8 F (36.6 C)  TempSrc: Oral  SpO2: 97%  Weight: 210 lb 14.4 oz (95.7 kg)  Height: 5\' 9"  (1.753 m)    Physical Exam  Constitutional: Appears well-developed and well-nourished. No distress.  HENT: Normocephalic and atraumatic Cardiovascular: Normal rate, regular rhythm, S1 and S2 present, no murmurs, rubs, gallops.  Distal pulses intact Respiratory: No respiratory distress,  Lungs are clear to auscultation bilaterally. Musculoskeletal: Normal bulk and tone.  No peripheral edema noted. Neurological: Is alert and oriented x4, no apparent focal deficits noted. Skin: Warm and dry.  No rash, erythema, lesions noted. Psychiatric: Normal mood and affect.  Assessment/Plan:  See Problem Based Charting in the Encounters Tab  , MD Internal Medicine, PGY-2 04/07/20 10:41 AM Pager # [phone]

## 2020-04-07 NOTE — Patient Instructions (Addendum)
Ms Rindi Beechy,   It was a pleasure seeing you in clinic. Today we discussed:   Please continue taking your suboxone 8-2mg  twice daily as prescribed. Follow up in 2 weeks. I will call you if there are any changes to your medication regimen.   If you have any questions or concerns, please call our clinic at [phone] between 9am-5pm and after hours call [phone] and ask for the internal medicine resident on call. If you feel you are having a medical emergency please call 911.   Thank you, we look forward to helping you remain healthy!

## 2020-04-07 NOTE — Assessment & Plan Note (Signed)
Patient is following up today for opioid use disorder. In the past, her visits have been irregular. However, patient has been following up every 2 weeks the last few months. Today, patient denies any acute concerns. She is on soboxone 8-2mg  twice daily. Prior UDS was appropriate. She does endorse some cravings but notes that this is tolerable, denies any withdrawal symptoms.   Plan: Continue suboxone 8-2mg  twice daily Follow up in 2 weeks UTox at this visit  Patient requesting copy of her previous UDS results - advised to check on MyChart

## 2020-04-14 LAB — TOXASSURE SELECT,+ANTIDEPR,UR

## 2020-04-19 NOTE — Progress Notes (Signed)
Internal Medicine Clinic Attending ° °Case discussed with Dr. Aslam  At the time of the visit.  We reviewed the resident’s history and exam and pertinent patient test results.  I agree with the assessment, diagnosis, and plan of care documented in the resident’s note.  °

## 2020-04-21 ENCOUNTER — Encounter: Payer: Self-pay | Admitting: Student in an Organized Health Care Education/Training Program

## 2020-04-21 ENCOUNTER — Other Ambulatory Visit: Payer: Self-pay

## 2020-04-21 ENCOUNTER — Other Ambulatory Visit (HOSPITAL_COMMUNITY): Payer: Self-pay

## 2020-04-21 ENCOUNTER — Ambulatory Visit (INDEPENDENT_AMBULATORY_CARE_PROVIDER_SITE_OTHER): Payer: Self-pay | Admitting: Student in an Organized Health Care Education/Training Program

## 2020-04-21 DIAGNOSIS — G4700 Insomnia, unspecified: Secondary | ICD-10-CM

## 2020-04-21 DIAGNOSIS — F1120 Opioid dependence, uncomplicated: Secondary | ICD-10-CM

## 2020-04-21 MED ORDER — BUPRENORPHINE HCL-NALOXONE HCL 8-2 MG SL SUBL
1.0000 | SUBLINGUAL_TABLET | Freq: Two times a day (BID) | SUBLINGUAL | 1 refills | Status: DC
  Filled 2020-04-21: qty 28, 14d supply, fill #0
  Filled 2020-05-05: qty 28, 14d supply, fill #1

## 2020-04-21 MED ORDER — TRAZODONE HCL 50 MG PO TABS
50.0000 mg | ORAL_TABLET | Freq: Every evening | ORAL | 1 refills | Status: DC | PRN
  Filled 2020-04-21: qty 30, 30d supply, fill #0
  Filled 2020-06-18: qty 30, 30d supply, fill #1

## 2020-04-21 NOTE — Progress Notes (Signed)
   Assessment and Plan:  See Encounters tab for problem-based medical decision making.   __________________________________________________________  HPI:   35 year old person here for follow up of OUD. She is doing well, reports good adherence to suboxone. She had some confusion about follow up appointment, so ran out of suboxone last week, she had to change the amount she takes in order to keep a low level of suboxone in her system through the weekend. Today she is feeling in mild withdrawal, but still doing well. Denies any relapses. 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 She reports home life is going well. No fevers or chills, no constipation, denies dental issues.   __________________________________________________________  Problem List: Patient Active Problem List   Diagnosis Date Noted  . Opioid use disorder, severe, dependence (HCC) 02/06/2018    Priority: High  . Insomnia 02/18/2020    Priority: Medium  . Depression, major, 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  . Obesity (BMI 30-39.9) 05/16/2019  . Healthcare maintenance 09/24/2018  . Underinsured 09/24/2018  . Normocytic anemia 03/06/2018  . History of hepatitis C 02/13/2018  . Anxiety and depression 02/06/2018    Medications: Reconciled today in Epic __________________________________________________________  Physical Exam:  Vital Signs: Vitals:   04/21/20 1018  BP: 126/75  Pulse: 63  Temp: 98 F (36.7 C)  TempSrc: Oral  SpO2: 97%  Weight: 214 lb 1.6 oz (97.1 kg)  Height: 5\' 9"  (1.753 m)    Gen: Well appearing, NAD Neck: No cervical LAD, No thyromegaly or nodules, CV: RRR, no murmurs Ext: Warm, no edema,

## 2020-04-21 NOTE — Assessment & Plan Note (Signed)
OUD is well controlled on current regimen of Suboxone. We will continue 8mg  tablet twice daily, I sent a one month supply. Urine toxassure was appropriate last visit. Database was appropriate. We talked about insurance, I advised she considers purchasing health insurance on the open exchange which will improve her access to suboxone and other health maintenance in the future. Follow up with in 4 weeks by telephone. I congratulated her on 2.5 years in treatment, and she seems to be doing very well.

## 2020-04-21 NOTE — Assessment & Plan Note (Signed)
Continues to have moderate problems with insomnia mostly related to her work schedule. She gets off work around [employer], then has trouble initiating sleep, has responsibilities in the morning, works 6 days a week. She finds trazodone very helpful. We talked about sleep hygiene. She is going to continue using trazodone just as needed, usually about 3 nights a week.

## 2020-05-05 ENCOUNTER — Other Ambulatory Visit: Payer: Self-pay

## 2020-05-05 ENCOUNTER — Other Ambulatory Visit (HOSPITAL_COMMUNITY): Payer: Self-pay

## 2020-05-05 DIAGNOSIS — F1120 Opioid dependence, uncomplicated: Secondary | ICD-10-CM

## 2020-05-05 NOTE — Telephone Encounter (Signed)
Pt is requesting buprenorphine-naloxone (SUBOXONE) 8-2 mg SUBL SL tablet sent to  [facility] Phone:  [phone]  Fax:  [phone]      ( pt is completely  Out of her medicine )

## 2020-05-05 NOTE — Telephone Encounter (Signed)
Next appt scheduled 5/17 in OUD.

## 2020-05-06 NOTE — Telephone Encounter (Signed)
Patient should have 1 refill left for 2 week supply. Left detailed message on patient's self-identified VM with this information.

## 2020-05-18 ENCOUNTER — Other Ambulatory Visit: Payer: Self-pay | Admitting: Student in an Organized Health Care Education/Training Program

## 2020-05-18 ENCOUNTER — Other Ambulatory Visit (HOSPITAL_COMMUNITY): Payer: Self-pay

## 2020-05-18 DIAGNOSIS — F1120 Opioid dependence, uncomplicated: Secondary | ICD-10-CM

## 2020-05-18 MED ORDER — BUPRENORPHINE HCL-NALOXONE HCL 8-2 MG SL SUBL
1.0000 | SUBLINGUAL_TABLET | Freq: Two times a day (BID) | SUBLINGUAL | 1 refills | Status: DC
  Filled 2020-05-18: qty 28, 14d supply, fill #0
  Filled 2020-06-18 – 2020-08-12 (×3): qty 28, 14d supply, fill #1

## 2020-05-19 ENCOUNTER — Ambulatory Visit (INDEPENDENT_AMBULATORY_CARE_PROVIDER_SITE_OTHER): Payer: Self-pay | Admitting: Student in an Organized Health Care Education/Training Program

## 2020-05-19 ENCOUNTER — Encounter: Payer: Self-pay | Admitting: Student in an Organized Health Care Education/Training Program

## 2020-05-19 ENCOUNTER — Other Ambulatory Visit: Payer: Self-pay

## 2020-05-19 DIAGNOSIS — F1120 Opioid dependence, uncomplicated: Secondary | ICD-10-CM

## 2020-05-19 NOTE — Progress Notes (Signed)
  [facility] Internal Medicine Residency Telephone Encounter Continuity Care Appointment  HPI:   This telephone encounter was created for Ms. Cheryl Hogan on 05/19/2020 for the following purpose/cc OBOT.  Patient is doing well, over 2 years of treatment. Good adherence with suboxone twice daily, no side effects. Did not run out early this month. We discussed some confusion about refills and follow up interval, I would like for her to keep seeing [place] monthly for now. Seems to be in a transition period with her work hours which has been stressful. No fevers, no illness, no withdrawal symptoms.    Past Medical History:  Past Medical History:  Diagnosis Date  . Abscess of right hand 01/24/2018  . ADD (attention deficit disorder)   . Anxiety   . Cellulitis of left hand   . Depression   . Injection of illicit drug within last 12 months   . Opioid dependence on agonist therapy (HCC)   . Psoriasis   . Pulmonary abscess (HCC) 01/24/2018      ROS:      Assessment / Plan / Recommendations:   Please see A&P under problem oriented charting for assessment of the patient's acute and chronic medical conditions.   As always, pt is advised that if symptoms worsen or new symptoms arise, they should go to an urgent care facility or to to ER for further evaluation.   Consent and Medical Decision Making:    This is a telephone encounter between Cheryl Hogan and Tyson Alias on 05/19/2020 for OUD. The visit was conducted with the patient located at home and Tyson Alias at [facility]. The patient's identity was confirmed using their DOB and current address. The patient has consented to being evaluated through a telephone encounter and understands the associated risks (an examination cannot be done and the patient may need to come in for an appointment) / benefits (allows the patient to remain at home, decreasing exposure to coronavirus). I personally spent 5 minutes on medical discussion.

## 2020-05-19 NOTE — Assessment & Plan Note (Signed)
Doing well. Stable on twice daily suboxone, good access to the medicine. No side effects. Not running out early this month. Last urine toxassure was appropriate. I reviewed the database which was appropriate. Her change in work schedule has been a stressor, but she seems to be coping with it. Will continue with suboxone, I sent a 1 month supply to her pharmacy yesterday. Follow up with [place] in person in one month.

## 2020-05-22 ENCOUNTER — Telehealth: Payer: Self-pay

## 2020-05-22 NOTE — Telephone Encounter (Signed)
Requesting to speak with Lauren, please call pt back.  

## 2020-05-22 NOTE — Telephone Encounter (Signed)
Pls contact pt at this [phone]

## 2020-05-22 NOTE — Telephone Encounter (Signed)
Returned call to patient. No answer. Left message on VM requesting return call.  °

## 2020-05-26 NOTE — Telephone Encounter (Signed)
Patient called back. Scheduled 4 week in person f/u in OUD Clinic for 6/14 at 9:45.

## 2020-06-18 ENCOUNTER — Other Ambulatory Visit (HOSPITAL_COMMUNITY): Payer: Self-pay

## 2020-06-24 ENCOUNTER — Telehealth: Payer: Self-pay | Admitting: *Deleted

## 2020-06-24 NOTE — Telephone Encounter (Signed)
Cheryl Hogan, Guardian Ad Litem called to verify patient's appt on 6/14. Last OV was 5/17. Patient no showed on 6/14. Nothing further needed at this time.

## 2020-06-26 ENCOUNTER — Other Ambulatory Visit (HOSPITAL_COMMUNITY): Payer: Self-pay

## 2020-06-30 ENCOUNTER — Other Ambulatory Visit (HOSPITAL_COMMUNITY): Payer: Self-pay

## 2020-07-02 ENCOUNTER — Telehealth: Payer: Self-pay | Admitting: *Deleted

## 2020-07-02 NOTE — Telephone Encounter (Signed)
Call from Synthia Innocent from [facility] Dept of Social [employer].  Stated she needs to know when was pt's last appt and next appt. Release of information form sign by pt; fax# given. Informed pt does not have an up coming appt ; last appt was 05/19/20; and she did not come to the 6/14 appt.

## 2020-07-08 ENCOUNTER — Other Ambulatory Visit (HOSPITAL_COMMUNITY): Payer: Self-pay

## 2020-07-09 ENCOUNTER — Telehealth: Payer: Self-pay | Admitting: *Deleted

## 2020-07-09 NOTE — Telephone Encounter (Signed)
Received call from Synthia Innocent, SW with [facility]. She is requesting patient's last UTox for court. Patient is trying to have unsupervised visits with her child, however, she has been displaying some concerning behaviors over the last month. [name] is aware that patient no showed her last OUD appt on 6/14 and has not r/s. UDS from 4/5 printed out and signed by Attending. This was given to [employer] to fax once ROI is received.

## 2020-07-10 NOTE — Telephone Encounter (Signed)
ROI received from Synthia Innocent, [place]. UDS from 4/5 faxed to her at [phone]. Fax confirmation receipt received.

## 2020-08-12 ENCOUNTER — Other Ambulatory Visit (HOSPITAL_COMMUNITY): Payer: Self-pay

## 2020-08-20 ENCOUNTER — Other Ambulatory Visit (HOSPITAL_COMMUNITY): Payer: Self-pay

## 2020-10-19 ENCOUNTER — Telehealth: Payer: Self-pay | Admitting: *Deleted

## 2020-10-19 NOTE — Telephone Encounter (Signed)
Call from [employer], [employer]. Requesting pt's last visit and upcoming appt. Informed no upcoming appt and last appt was 05/19/20.

## 2020-10-21 ENCOUNTER — Other Ambulatory Visit (HOSPITAL_COMMUNITY): Payer: Self-pay

## 2020-10-21 MED ORDER — NAPROXEN 500 MG PO TABS
500.0000 mg | ORAL_TABLET | Freq: Two times a day (BID) | ORAL | 0 refills | Status: DC
  Filled 2020-10-21: qty 30, 15d supply, fill #0

## 2020-10-21 MED ORDER — CLINDAMYCIN HCL 150 MG PO CAPS
300.0000 mg | ORAL_CAPSULE | Freq: Four times a day (QID) | ORAL | 0 refills | Status: AC
  Filled 2020-10-21 (×2): qty 80, 10d supply, fill #0

## 2020-10-29 ENCOUNTER — Other Ambulatory Visit (HOSPITAL_COMMUNITY): Payer: Self-pay

## 2021-04-27 DIAGNOSIS — R768 Other specified abnormal immunological findings in serum: Secondary | ICD-10-CM | POA: Insufficient documentation

## 2021-04-28 ENCOUNTER — Other Ambulatory Visit (HOSPITAL_COMMUNITY): Payer: Self-pay

## 2021-04-28 MED ORDER — NALOXONE HCL 4 MG/0.1ML NA LIQD
NASAL | 0 refills | Status: DC
  Filled 2021-04-28: qty 2, 2d supply, fill #0

## 2021-04-28 MED ORDER — MULTIPLE VITAMINS PO TABS: 1.0000 | ORAL_TABLET | Freq: Every day | ORAL | 3 refills | Status: DC

## 2021-04-28 MED ORDER — DOCUSATE SODIUM 100 MG PO CAPS: ORAL_CAPSULE | ORAL | 0 refills | Status: DC

## 2021-04-28 MED ORDER — LOPERAMIDE HCL 2 MG PO CAPS: ORAL_CAPSULE | ORAL | 0 refills | Status: DC

## 2021-04-28 MED ORDER — IBUPROFEN 800 MG PO TABS
800.0000 mg | ORAL_TABLET | Freq: Three times a day (TID) | ORAL | 0 refills | Status: DC
  Filled 2021-04-28: qty 30, 10d supply, fill #0

## 2021-05-06 ENCOUNTER — Other Ambulatory Visit (HOSPITAL_COMMUNITY): Payer: Self-pay

## 2022-03-24 ENCOUNTER — Other Ambulatory Visit: Payer: Self-pay

## 2022-03-24 ENCOUNTER — Emergency Department (HOSPITAL_BASED_OUTPATIENT_CLINIC_OR_DEPARTMENT_OTHER)
Admission: EM | Admit: 2022-03-24 | Discharge: 2022-03-24 | Disposition: A | Discharge status: Home / Self Care | Payer: Medicaid Other | Attending: Emergency Medicine | Admitting: Emergency Medicine

## 2022-03-24 ENCOUNTER — Emergency Department (HOSPITAL_BASED_OUTPATIENT_CLINIC_OR_DEPARTMENT_OTHER): Payer: Medicaid Other

## 2022-03-24 ENCOUNTER — Encounter (HOSPITAL_BASED_OUTPATIENT_CLINIC_OR_DEPARTMENT_OTHER): Payer: Self-pay

## 2022-03-24 ENCOUNTER — Ambulatory Visit: Payer: Self-pay

## 2022-03-24 DIAGNOSIS — S0592XA Unspecified injury of left eye and orbit, initial encounter: Secondary | ICD-10-CM | POA: Diagnosis present

## 2022-03-24 DIAGNOSIS — M549 Dorsalgia, unspecified: Secondary | ICD-10-CM | POA: Insufficient documentation

## 2022-03-24 DIAGNOSIS — W01190A Fall on same level from slipping, tripping and stumbling with subsequent striking against furniture, initial encounter: Secondary | ICD-10-CM | POA: Diagnosis not present

## 2022-03-24 DIAGNOSIS — F17210 Nicotine dependence, cigarettes, uncomplicated: Secondary | ICD-10-CM | POA: Insufficient documentation

## 2022-03-24 DIAGNOSIS — K047 Periapical abscess without sinus: Secondary | ICD-10-CM

## 2022-03-24 DIAGNOSIS — S01112A Laceration without foreign body of left eyelid and periocular area, initial encounter: Secondary | ICD-10-CM

## 2022-03-24 LAB — PREGNANCY, URINE: Preg Test, Ur: NEGATIVE

## 2022-03-24 MED ORDER — AMOXICILLIN 500 MG PO CAPS
500.0000 mg | ORAL_CAPSULE | Freq: Three times a day (TID) | ORAL | 0 refills | Status: AC
  Filled 2022-03-24 – 2022-05-05 (×4): qty 21, 7d supply, fill #0

## 2022-03-24 MED ORDER — TRIAMCINOLONE ACETONIDE 0.1 % EX CREA
1.0000 | TOPICAL_CREAM | Freq: Two times a day (BID) | CUTANEOUS | 0 refills | Status: DC
  Filled 2022-03-24 – 2022-05-05 (×4): qty 30, 15d supply, fill #0

## 2022-03-24 MED ORDER — IBUPROFEN 400 MG PO TABS
600.0000 mg | ORAL_TABLET | Freq: Once | ORAL | Status: AC
  Administered 2022-03-24: 600 mg via ORAL
  Filled 2022-03-24: qty 1

## 2022-03-24 MED ORDER — LIDOCAINE HCL (PF) 1 % IJ SOLN
5.0000 mL | Freq: Once | INTRAMUSCULAR | Status: AC
  Administered 2022-03-24: 5 mL
  Filled 2022-03-24: qty 5

## 2022-03-24 NOTE — ED Notes (Signed)
Discharge paperwork reviewed entirely with patient, including Rx's and follow up care. Pain was under control. Pt verbalized understanding as well as all parties involved. No questions or concerns voiced at the time of discharge. No acute distress noted.   Pt ambulated out to PVA without incident or assistance.  

## 2022-03-24 NOTE — Telephone Encounter (Signed)
  Chief Complaint: Head injury Symptoms: Bleeding over eye brow Frequency: This morning Pertinent Negatives: Patient denies losing consciousness Disposition: [x] ED /[] Urgent Care (no appt availability in office) / [] Appointment(In office/virtual)/ []  [place] [facility]/ [] Home Care/ [] Refused Recommended Disposition /[] [facility]/ []  Follow-up with PCP Additional Notes: PT states that wound is fairly deep. PT may need stiches. Pt does not want a scar. PT thinks she has an up to date tetanus shot.    Reason for Disposition  [1] Last tetanus shot > 5 years ago AND [2] DIRTY cut or scrape  Answer Assessment - Initial Assessment Questions 1. MECHANISM: "How did the injury happen?" For falls, ask: "What height did you fall from?" and "What surface did you fall against?"      [address] out of bed and hit her head 2. ONSET: "When did the injury happen?" (Minutes or hours ago)      This morning 3. NEUROLOGIC SYMPTOMS: "Was there any loss of consciousness?" "Are there any other neurological symptoms?"      no 4. MENTAL STATUS: "Does the person know who they are, who you are, and where they are?"      yes 5. LOCATION: "What part of the head was hit?"      Left eyebrow 6. SCALP APPEARANCE: "What does the scalp look like? Is it bleeding now?" If Yes, ask: "Is it difficult to stop?"      Still bleeding a bit 7. SIZE: For cuts, bruises, or swelling, ask: "How large is it?" (e.g., inches or centimeters)      1/2 inch X  8. PAIN: "Is there any pain?" If Yes, ask: "How bad is it?"  (e.g., Scale 1-10; or mild, moderate, severe)     1/10 9. TETANUS: For any breaks in the skin, ask: "When was the last tetanus booster?"     unsure 10. OTHER SYMPTOMS: "Do you have any other symptoms?" (e.g., neck pain, vomiting)       no  Protocols used: Head Injury-A-AH

## 2022-03-24 NOTE — ED Triage Notes (Signed)
Pt arrives with c/o head injury that happened this morning. Pt hit her head on the corner of a knight stand. Pt denies neck pain.

## 2022-03-24 NOTE — ED Notes (Signed)
Patient transported to X-ray 

## 2022-03-24 NOTE — Discharge Instructions (Addendum)
You are seen today for a laceration to your eyebrow.  This was sutured as above.  You need to have the sutures removed here or at primary care in approximately 5 days.  Keep the area clean and dry.  Back if you have swelling, redness, drainage or fever.  You are being given antibiotics for dental infection.  Make sure you finish all the antibiotics and follow-up with a dentist.  You are provided with a list of dental cyst in the area on your discharge paperwork  You are also given information for the community health and wellness clinic to start PCP care, as well as the internal medicine clinic.  Which ever you prefer as fine

## 2022-03-24 NOTE — ED Provider Notes (Signed)
Nurse informed that patient request to be discharged without waiting for the result of her lumbar spine x-ray.  Since we have low suspicion for spinal fracture, will discharge patient per request.  Patient certainly can follow-up on the results of her xray through [facility] and may return for further care.  BP 133/87 (BP Location: Left Arm)   Pulse 75   Temp 98.1 F (36.7 C) (Oral)   Resp 18   Wt 97.5 kg   SpO2 97%   BMI 31.75 kg/m   Results for orders placed or performed during the hospital encounter of 03/24/22  Pregnancy, urine  Result Value Ref Range   Preg Test, Ur NEGATIVE NEGATIVE   No results found.    Domenic Moras, PA-C 03/24/22 1957    Tegeler, Gwenyth Allegra, MD 03/24/22 2045

## 2022-03-24 NOTE — ED Notes (Signed)
ED Provider at bedside. 

## 2022-03-24 NOTE — ED Provider Notes (Signed)
[facility] Provider Note   CSN: [id number] Arrival date & time: 03/24/22  1758     History  Chief Complaint  Patient presents with   Head Injury    Cheryl Hogan is a 37 y.o. female.  She resents ER for laceration to left eyebrow.  She states that around 930 this morning.  States she was sitting in bed smoking a cigarette and had just woken up.  She states she fell asleep briefly while sitting up and fell forward striking her eyebrow on the left side on a nightstand.  She denies loss of consciousness vomiting or headache but is concerned about needing sutures and worried about possible scar.  States she is having right upper dental pain for about a week, does not have a dentist, has no fevers chills or facial swelling or redness but would like referral for dentist.  She states she has not been to a primary care doctor in a very long time as she has never had insurance and would like referrals.  Patient also complaining of back pain   Head Injury      Home Medications Prior to Admission medications   Medication Sig Start Date End Date Taking? Authorizing Provider  amoxicillin (AMOXIL) 500 MG capsule Take 1 capsule (500 mg total) by mouth 3 (three) times daily for 7 days. 03/24/22 03/31/22 Yes Maleiah Dula A, PA-C  buprenorphine-naloxone (SUBOXONE) 8-2 mg SUBL SL tablet Place 1 tablet under the tongue 2 (two) times daily. 05/18/20   Axel Filler, MD  docusate sodium (COLACE) 100 MG capsule Take 1 capsule (100 mg total) by mouth 2 (two)  times daily as needed for Constipation. 04/27/21     ibuprofen (ADVIL) 800 MG tablet Take 1 tablet (800 mg total) by mouth every 8 (eight) hours. 04/27/21     loperamide (IMODIUM) 2 MG capsule Take 1 capsule (2 mg total) by mouth every 6 (six) hours as needed for Diarrhea. 04/27/21     Multiple Vitamins tablet Take 1 tablet by mouth daily. 04/27/21     naloxone (NARCAN) nasal spray 4 mg/0.1 mL For  suspected opioid overdose, spray one unit into one nostril, repeat after 3 minutes in alternating nostril if no or minimal response. 04/27/21     naproxen (NAPROSYN) 500 MG tablet Take 1 tablet (500 mg total) by mouth 2 (two) times daily with a meal. 10/20/20     traZODone (DESYREL) 50 MG tablet Take 1 tablet (50 mg total) by mouth at bedtime as needed for sleep. 04/21/20 04/21/21  Axel Filler, MD  triamcinolone ointment (KENALOG) 0.1 % Apply 1 application topically 2 (two) times daily. 03/26/18   Lucious Groves, DO      Allergies    Patient has no known allergies.    Review of Systems   Review of Systems  Physical Exam Updated Vital Signs BP 133/87 (BP Location: Left Arm)   Pulse 75   Temp 98.1 F (36.7 C) (Oral)   Resp 18   Wt 97.5 kg   SpO2 97%   BMI 31.75 kg/m  Physical Exam Vitals and nursing note reviewed.  Constitutional:      General: She is not in acute distress.    Appearance: She is well-developed.  HENT:     Head: Normocephalic and atraumatic.     Jaw: There is normal jaw occlusion.     Mouth/Throat:     Lips: Pink.     Mouth: Mucous membranes  are moist.     Pharynx: Oropharynx is clear. Uvula midline.     Comments: Has overall poor dentition with tenderness and severe dental caries right upper wisdom tooth.  Patient speaks in full and clear sentences and handles oral secretions well Eyes:     Conjunctiva/sclera: Conjunctivae normal.  Cardiovascular:     Rate and Rhythm: Normal rate and regular rhythm.     Heart sounds: No murmur heard. Pulmonary:     Effort: Pulmonary effort is normal. No respiratory distress.     Breath sounds: Normal breath sounds.  Abdominal:     Palpations: Abdomen is soft.     Tenderness: There is no abdominal tenderness.  Musculoskeletal:        General: No swelling.     Cervical back: Neck supple.     Lumbar back: Bony tenderness present. No swelling or edema. Normal range of motion.  Skin:    General: Skin is warm and  dry.     Capillary Refill: Capillary refill takes less than 2 seconds.     Comments: Approximately 2 cm V-shaped laceration to left eyebrow with no active bleeding  Neurological:     Mental Status: She is alert.  Psychiatric:        Mood and Affect: Mood normal.     ED Results / Procedures / Treatments   Labs (all labs ordered are listed, but only abnormal results are displayed) Labs Reviewed  PREGNANCY, URINE    EKG None  Radiology No results found.  Procedures .Marland KitchenLaceration Repair  Date/Time: 03/24/2022 7:08 PM  Performed by: Gwenevere Abbot, PA-C Authorized by: Gwenevere Abbot, PA-C   Consent:    Consent obtained:  Verbal   Consent given by:  Patient   Risks, benefits, and alternatives were discussed: yes     Risks discussed:  Infection, pain, poor cosmetic result and poor wound healing   Alternatives discussed:  No treatment Universal protocol:    Procedure explained and questions answered to patient or proxy's satisfaction: yes     Patient identity confirmed:  Verbally with patient Anesthesia:    Anesthesia method:  Local infiltration   Local anesthetic:  Lidocaine 1% w/o epi Laceration details:    Location: left eyebrow.   Length (cm):  2 Pre-procedure details:    Preparation:  Patient was prepped and draped in usual sterile fashion Exploration:    Hemostasis achieved with:  Direct pressure   Wound exploration: entire depth of wound visualized     Wound extent: no foreign body     Contaminated: no   Treatment:    Area cleansed with:  Saline   Amount of cleaning:  Standard   Irrigation solution:  Sterile saline   Irrigation volume:  10 cc   Irrigation method:  Syringe   Visualized foreign bodies/material removed: no     Debridement:  None   Undermining:  None   Scar revision: no   Skin repair:    Repair method:  Sutures   Suture size:  6-0   Suture material:  Prolene   Suture technique:  Simple interrupted (Stitch in the center was corner  stitch)   Number of sutures:  4 Approximation:    Approximation:  Close Repair type:    Repair type:  Simple Post-procedure details:    Dressing:  Antibiotic ointment   Procedure completion:  Tolerated well, no immediate complications     Medications Ordered in ED Medications  ibuprofen (ADVIL) tablet 600 mg (has no administration  in time range)  lidocaine (PF) (XYLOCAINE) 1 % injection 5 mL (5 mLs Infiltration Given by Other 03/24/22 1907)    ED Course/ Medical Decision Making/ A&P                             Medical Decision Making Diagnosis: Concussion, laceration, contusion, intracranial hemorrhage, other ED course: Patient presents for laceration to the left eyebrow, states she is up-to-date on her tetanus shot, no active bleeding.  She had no loss of consciousness, is a minor mechanism where patient had a fall asleep while sitting up and fell forward into her nightstand, states she woke up was noticed that she hit her face, no vomiting, no somnolence, normal neurologic exam.  Per [place] head CT rules and C-spine rules no need for head or neck imaging.  She does have low back pain, not sure if she hit her back at some point but has midline tenderness.  Patient has history of opiate abuse, does not see primary care, is not the most reliable historian so we will get plain films. Suture laceration and will sign out pending x-ray results.  She is being prescribed amoxicillin for dental infection, she has no signs of deep space infection is the right upper wisdom tooth.  She was given dental list as well and given PCP follow-up at her request just for regular primary care and routine follow-up.  Advised on suture removal in about 5 days, wound care and return precautions.  Also requesting triamcinolone cream for her psoriasis on her bilateral hands  Signed out to oncoming team  Amount and/or Complexity of Data Reviewed Labs: ordered. Radiology: ordered.  Risk Prescription drug  management.           Final Clinical Impression(s) / ED Diagnoses Final diagnoses:  Laceration of left eyebrow, initial encounter  Periapical abscess without sinus tract    Rx / DC Orders ED Discharge Orders          Ordered    amoxicillin (AMOXIL) 500 MG capsule  3 times daily        03/24/22 1901              Darci Current 03/24/22 1938    Tegeler, Gwenyth Allegra, MD 03/24/22 2045

## 2022-03-24 NOTE — ED Notes (Signed)
Pt state got a phone call and needed to leave, did not want to wait on xr result. Instructed that it will be on her Mychart and to return to ER or pcp for any new or worsening symptoms.

## 2022-03-24 NOTE — ED Notes (Signed)
Per provider pt had tenderness to lumbar spine on exam

## 2022-03-24 NOTE — ED Notes (Signed)
Pt standing in room pt states that she fell this am and bumped her head, pt has some dried blood on her L eye brow, pt pupils are 27mm and reactive to light, pt also c/o R dental pain, teeth are broken/decayed. Pt states that she would also like referral to PMD and dentist and OB, ice pack given.

## 2022-03-25 ENCOUNTER — Other Ambulatory Visit (HOSPITAL_COMMUNITY): Payer: Self-pay

## 2022-04-04 ENCOUNTER — Encounter (HOSPITAL_BASED_OUTPATIENT_CLINIC_OR_DEPARTMENT_OTHER): Payer: Self-pay

## 2022-04-04 ENCOUNTER — Other Ambulatory Visit: Payer: Self-pay

## 2022-04-04 ENCOUNTER — Other Ambulatory Visit (HOSPITAL_COMMUNITY): Payer: Self-pay

## 2022-04-04 ENCOUNTER — Emergency Department (HOSPITAL_BASED_OUTPATIENT_CLINIC_OR_DEPARTMENT_OTHER)
Admission: EM | Admit: 2022-04-04 | Discharge: 2022-04-04 | Disposition: A | Discharge status: Home / Self Care | Payer: Medicaid Other | Attending: Emergency Medicine | Admitting: Emergency Medicine

## 2022-04-04 DIAGNOSIS — Z4802 Encounter for removal of sutures: Secondary | ICD-10-CM | POA: Diagnosis not present

## 2022-04-04 NOTE — ED Triage Notes (Signed)
Pt here to get sutures removed from 3/21, was supposed to get them out last Tuesday but hadnt been able to come in. Healed well, scabbing over site.

## 2022-04-04 NOTE — ED Provider Notes (Signed)
[facility] Provider Note   CSN: [id number] Arrival date & time: 04/04/22  1353     History  Chief Complaint  Patient presents with   Suture / Staple Removal    Cheryl Hogan is a 37 y.o. female.  Pt here for suture removal Pt has 4 sutures left eyebrow,   The history is provided by the patient.  Suture / Staple Removal       Home Medications Prior to Admission medications   Medication Sig Start Date End Date Taking? Authorizing Provider  buprenorphine-naloxone (SUBOXONE) 8-2 mg SUBL SL tablet Place 1 tablet under the tongue 2 (two) times daily. 05/18/20   Axel Filler, MD  docusate sodium (COLACE) 100 MG capsule Take 1 capsule (100 mg total) by mouth 2 (two)  times daily as needed for Constipation. 04/27/21     ibuprofen (ADVIL) 800 MG tablet Take 1 tablet (800 mg total) by mouth every 8 (eight) hours. 04/27/21     loperamide (IMODIUM) 2 MG capsule Take 1 capsule (2 mg total) by mouth every 6 (six) hours as needed for Diarrhea. 04/27/21     Multiple Vitamins tablet Take 1 tablet by mouth daily. 04/27/21     naloxone (NARCAN) nasal spray 4 mg/0.1 mL For suspected opioid overdose, spray one unit into one nostril, repeat after 3 minutes in alternating nostril if no or minimal response. 04/27/21     naproxen (NAPROSYN) 500 MG tablet Take 1 tablet (500 mg total) by mouth 2 (two) times daily with a meal. 10/20/20     traZODone (DESYREL) 50 MG tablet Take 1 tablet (50 mg total) by mouth at bedtime as needed for sleep. 04/21/20 04/21/21  Axel Filler, MD  triamcinolone cream (KENALOG) 0.1 % Apply 1 Application topically 2 (two) times daily. 03/24/22   Sherrye Payor A, PA-C  triamcinolone ointment (KENALOG) 0.1 % Apply 1 application topically 2 (two) times daily. 03/26/18   Lucious Groves, DO      Allergies    Patient has no known allergies.    Review of Systems   Review of Systems  All other systems reviewed and are  negative.   Physical Exam Updated Vital Signs BP 105/72 (BP Location: Left Arm)   Pulse 80   Temp 98 F (36.7 C) (Oral)   Resp 18   Ht 5\' 9"  (1.753 m)   Wt 97.5 kg   LMP 03/29/2022 (Approximate)   SpO2 100%   BMI 31.75 kg/m  Physical Exam Vitals reviewed.  Constitutional:      Appearance: Normal appearance.  HENT:     Head: Normocephalic.     Comments: Healed laceration right eyebrow,  Neurological:     Mental Status: She is alert.     ED Results / Procedures / Treatments   Labs (all labs ordered are listed, but only abnormal results are displayed) Labs Reviewed - No data to display  EKG None  Radiology No results found.  Procedures Procedures    Medications Ordered in ED Medications - No data to display  ED Course/ Medical Decision Making/ A&P                             Medical Decision Making Pt here for suture removal            Final Clinical Impression(s) / ED Diagnoses Final diagnoses:  Visit for suture removal    Rx /  DC Orders ED Discharge Orders     None     An After Visit Summary was printed and given to the patient.     Fransico Meadow, [place] 04/04/22 1416    Wyvonnia Dusky, MD 04/04/22 [phone]

## 2022-04-04 NOTE — ED Notes (Signed)
Sutures removed by provider

## 2022-04-19 ENCOUNTER — Other Ambulatory Visit (HOSPITAL_COMMUNITY): Payer: Self-pay

## 2022-04-28 ENCOUNTER — Other Ambulatory Visit (HOSPITAL_COMMUNITY): Payer: Self-pay

## 2022-05-05 ENCOUNTER — Other Ambulatory Visit (HOSPITAL_BASED_OUTPATIENT_CLINIC_OR_DEPARTMENT_OTHER): Payer: Self-pay

## 2022-05-05 ENCOUNTER — Other Ambulatory Visit (HOSPITAL_COMMUNITY): Payer: Self-pay

## 2022-05-06 ENCOUNTER — Other Ambulatory Visit (HOSPITAL_BASED_OUTPATIENT_CLINIC_OR_DEPARTMENT_OTHER): Payer: Self-pay

## 2022-08-10 ENCOUNTER — Other Ambulatory Visit (HOSPITAL_BASED_OUTPATIENT_CLINIC_OR_DEPARTMENT_OTHER): Payer: Self-pay

## 2022-08-10 ENCOUNTER — Encounter: Payer: Self-pay | Admitting: Physician Assistant

## 2022-08-10 ENCOUNTER — Other Ambulatory Visit (HOSPITAL_COMMUNITY)
Admission: RE | Admit: 2022-08-10 | Discharge: 2022-08-10 | Disposition: A | Discharge status: Home / Self Care | Payer: Medicaid Other | Source: Ambulatory Visit | Attending: Physician Assistant | Admitting: Physician Assistant

## 2022-08-10 ENCOUNTER — Ambulatory Visit (INDEPENDENT_AMBULATORY_CARE_PROVIDER_SITE_OTHER): Payer: Medicaid Other | Admitting: Physician Assistant

## 2022-08-10 VITALS — BP 96/62 | HR 88 | Resp 20 | Ht 69.0 in | Wt 194.0 lb

## 2022-08-10 DIAGNOSIS — Z720 Tobacco use: Secondary | ICD-10-CM

## 2022-08-10 DIAGNOSIS — R5383 Other fatigue: Secondary | ICD-10-CM | POA: Diagnosis not present

## 2022-08-10 DIAGNOSIS — Z3009 Encounter for other general counseling and advice on contraception: Secondary | ICD-10-CM | POA: Diagnosis not present

## 2022-08-10 DIAGNOSIS — Z113 Encounter for screening for infections with a predominantly sexual mode of transmission: Secondary | ICD-10-CM | POA: Diagnosis not present

## 2022-08-10 DIAGNOSIS — Z8619 Personal history of other infectious and parasitic diseases: Secondary | ICD-10-CM

## 2022-08-10 DIAGNOSIS — G4709 Other insomnia: Secondary | ICD-10-CM

## 2022-08-10 DIAGNOSIS — F1991 Other psychoactive substance use, unspecified, in remission: Secondary | ICD-10-CM

## 2022-08-10 DIAGNOSIS — F1910 Other psychoactive substance abuse, uncomplicated: Secondary | ICD-10-CM | POA: Insufficient documentation

## 2022-08-10 LAB — POCT URINE PREGNANCY: Preg Test, Ur: NEGATIVE

## 2022-08-10 MED ORDER — TRAZODONE HCL 50 MG PO TABS
25.0000 mg | ORAL_TABLET | Freq: Every evening | ORAL | 3 refills | Status: DC | PRN
  Filled 2022-08-10: qty 30, 30d supply, fill #0

## 2022-08-10 NOTE — Assessment & Plan Note (Signed)
Poc pregnancy negative. Pt reports she had depo 1-2 mo ago; but she would prefer to switch to nexplanon. Referred to gyn.

## 2022-08-10 NOTE — Assessment & Plan Note (Signed)
Ordered labs; cbc, cmp, iron panel, vit b12.  Pt was a difficult stick today, she reports she will come back hydrated.

## 2022-08-10 NOTE — Assessment & Plan Note (Signed)
Okay with 25 mg- 50 mg trazodone prn at bedtime.

## 2022-08-10 NOTE — Assessment & Plan Note (Addendum)
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 The friend she brings today shook his head at this statement, but neither will elaborate.  I advised it is important for me to know the substances she is taking if I am to provide her medical care. Drug screen ordered.  On chart review: In 2020, pt had b/l hand abscess, as a sequale of iv drug use, 2d echo was concerning for vegetations on valves, TEE neg for endocarditis but CT did show septic emboli in lungs.

## 2022-08-10 NOTE — Assessment & Plan Note (Signed)
Pt currently smokes, not interested in quitting. Smoking cessation advised

## 2022-08-10 NOTE — Progress Notes (Unsigned)
New patient visit   Patient: Cheryl Hogan   DOB: 09/26/1985   37 y.o. Female  MRN: [id number] Visit Date: 08/10/2022  Today's healthcare provider: Alfredia Ferguson, PA-C   Chief Complaint  Patient presents with   Medication Refill    Birth control   Insomnia    Problems sleeping   Subjective    Cheryl Hogan is a 37 y.o. female who presents today as a new patient to establish care. She presents today with a friend. HPI Pt reports a history of IV drug use, clean per pt in 2020. She has a history of Hep C reports it was treated and resolved.  Pt reports a desire today to start birth control, wanting a pregnancy test . Reports her last menstrual cycle was around a month ago. Denies possibility of pregnancy.  She is interested in a nexplanon implant. Reports she last had a Depo injection 1-2 months ago from the [facility] department.  She also reports struggling with insomnia, unable to fall asleep or stay asleep. Reports trazodone has worked in the past.  Reports pain in b/l legs/feet, feels pain when she is standing for extended periods, starts in her feet and goes up to her calves. Pain is relieved when walking or sitting down. Denies history of back pain or injury. Denies history of diabetes. Denies numbness unless her feet are falling asleep.   Pt recalls a distant history of 'heart problems'. Does not recall exactly. When I asked if she was ever diagnosed with endocarditis, pt states she did have that. She was unable to elaborate on timeline or treatment.  Past Medical History:  Diagnosis Date   Abscess of right hand 01/24/2018   ADD (attention deficit disorder)    Anxiety    Cellulitis of left hand    Depression    Injection of illicit drug within last 12 months    Opioid dependence on agonist therapy (HCC)    Psoriasis    Pulmonary abscess (HCC) 01/24/2018   Past Surgical History:  Procedure Laterality Date   I & D EXTREMITY Bilateral 01/24/2018   Procedure: IRRIGATION  AND DEBRIDEMENT OF HAND;  Surgeon: Bradly Bienenstock, MD;  Location: [facility] OR;  Service: Orthopedics;  Laterality: Bilateral;   TEE WITHOUT CARDIOVERSION N/A 01/29/2018   Procedure: TRANSESOPHAGEAL ECHOCARDIOGRAM (TEE);  Surgeon: Thurmon Fair, MD;  Location: [facility] ENDOSCOPY;  Service: Cardiovascular;  Laterality: N/A;   Family Status  Relation Name Status   Neg Hx  (Not Specified)  No partnership data on file   Family History  Problem Relation Age of Onset   Liver cancer Neg Hx    Liver disease Neg Hx    Social History   Socioeconomic History   Marital status: Married    Spouse name: Not on file   Number of children: Not on file   Years of education: Not on file   Highest education level: Not on file  Occupational History   Not on file  Tobacco Use   Smoking status: Every Day    Current packs/day: 0.40    Types: Cigarettes   Smokeless tobacco: Never   Tobacco comments:    .5 PPD  Vaping Use   Vaping status: Never Used  Substance and Sexual Activity   Alcohol use: Not Currently   Drug use: Not Currently    Comment: Last used IV heroin January 2020   Sexual activity: Not Currently  Other Topics Concern   Not on file  Social History Narrative  Not on file   Social Determinants of Health   Financial Resource Strain: Not on file  Food Insecurity: Not on file  Transportation Needs: Not on file  Physical Activity: Not on file  Stress: Not on file  Social Connections: Not on file   Outpatient Medications Prior to Visit  Medication Sig   [DISCONTINUED] buprenorphine-naloxone (SUBOXONE) 8-2 mg SUBL SL tablet Place 1 tablet under the tongue 2 (two) times daily. (Patient not taking: Reported on 08/10/2022)   [DISCONTINUED] docusate sodium (COLACE) 100 MG capsule Take 1 capsule (100 mg total) by mouth 2 (two)  times daily as needed for Constipation. (Patient not taking: Reported on 08/10/2022)   [DISCONTINUED] ibuprofen (ADVIL) 800 MG tablet Take 1 tablet (800 mg total) by mouth every  8 (eight) hours. (Patient not taking: Reported on 08/10/2022)   [DISCONTINUED] loperamide (IMODIUM) 2 MG capsule Take 1 capsule (2 mg total) by mouth every 6 (six) hours as needed for Diarrhea. (Patient not taking: Reported on 08/10/2022)   [DISCONTINUED] Multiple Vitamins tablet Take 1 tablet by mouth daily. (Patient not taking: Reported on 08/10/2022)   [DISCONTINUED] naloxone (NARCAN) nasal spray 4 mg/0.1 mL For suspected opioid overdose, spray one unit into one nostril, repeat after 3 minutes in alternating nostril if no or minimal response. (Patient not taking: Reported on 08/10/2022)   [DISCONTINUED] naproxen (NAPROSYN) 500 MG tablet Take 1 tablet (500 mg total) by mouth 2 (two) times daily with a meal. (Patient not taking: Reported on 08/10/2022)   [DISCONTINUED] traZODone (DESYREL) 50 MG tablet Take 1 tablet (50 mg total) by mouth at bedtime as needed for sleep.   [DISCONTINUED] triamcinolone cream (KENALOG) 0.1 % Apply 1 Application topically 2 (two) times daily. (Patient not taking: Reported on 08/10/2022)   [DISCONTINUED] triamcinolone ointment (KENALOG) 0.1 % Apply 1 application topically 2 (two) times daily. (Patient not taking: Reported on 08/10/2022)   No facility-administered medications prior to visit.   No Known Allergies  Immunization History  Administered Date(s) Administered   Hepb-cpg 09/24/2018   Tdap 02/18/2015    Health Maintenance  Topic Date Due   PAP SMEAR-Modifier  Never done   COVID-19 Vaccine (1 - 2023-24 season) Never done   INFLUENZA VACCINE  08/04/2022   DTaP/Tdap/Td (2 - Td or Tdap) 02/17/2025   Hepatitis C Screening  Completed   HIV Screening  Completed   HPV VACCINES  Aged Out    Patient Care Team: Patient, [name] as PCP - General (General Practice)  Review of Systems   Objective    BP 96/62 (BP Location: Left Arm, Patient Position: Sitting, Cuff Size: Normal)   Pulse 88   Resp 20   Ht 5\' 9"  (1.753 m)   Wt 194 lb (88 kg)   SpO2 96%   BMI 28.65  kg/m   Physical Exam Constitutional:      General: She is awake.     Appearance: She is well-developed.     Comments: Pt is swaying in place, frequently gets up to move from chair to exam table. She has a cup of ice with her and keeps throwing pieces into the trash can from across the room.  She is awake, and responsive to questions, but often closes her eyes.  HENT:     Head: Normocephalic.  Eyes:     Conjunctiva/sclera: Conjunctivae normal.  Cardiovascular:     Rate and Rhythm: Normal rate and regular rhythm.     Heart sounds: Normal heart sounds.  Pulmonary:  Effort: Pulmonary effort is normal.     Breath sounds: Normal breath sounds.  Feet:     Comments: B/l DP 2+ warm extremities. No discoloration or ulcers appreciaed. Skin:    General: Skin is warm.  Neurological:     Mental Status: She is oriented to person, place, and time. She is lethargic.  Psychiatric:        Attention and Perception: Attention normal.        Mood and Affect: Mood normal.        Speech: Speech is slurred.        Behavior: Behavior is hyperactive. Behavior is cooperative.        Thought Content: Thought content normal.        Cognition and Memory: She exhibits impaired remote memory.     Depression Screen    04/21/2020   10:49 AM 04/07/2020   10:52 AM 03/24/2020    3:49 PM 03/24/2020    2:13 PM  PHQ 2/9 Scores  PHQ - 2 Score 0 0 0   PHQ- 9 Score 0 0 1      Information is confidential and restricted. Go to Review Flowsheets to unlock data.   Results for orders placed or performed in visit on 08/10/22  POCT urine pregnancy  Result Value Ref Range   Preg Test, Ur Negative Negative    Assessment & Plan      Problem List Items Addressed This Visit       Digestive   History of hepatitis C (Chronic)    On chart review, treated with 8 weeks of mavyret. Pt states she has been clean from IV drug use since 2020, but I have doubts 2/2 to her demeanor today. Will repeat hiv rna/ab.       Relevant Orders   Hepatitis C Antibody   Hepatitis C RNA quantitative     Other   Insomnia (Chronic)    Okay with 25 mg- 50 mg trazodone prn at bedtime.       Relevant Medications   traZODone (DESYREL) 50 MG tablet   Encounter for counseling regarding contraception - Primary    Poc pregnancy negative. Pt reports she had depo 1-2 mo ago; but she would prefer to switch to nexplanon. Referred to gyn.      Relevant Orders   POCT urine pregnancy (Completed)   Ambulatory referral to Gynecology   Tobacco use    Pt currently smokes, not interested in quitting. Smoking cessation advised      History of drug use    Per pt clean from IV heroin 2020. The friend she brings today shook his head at this statement, but neither will elaborate.  I advised it is important for me to know the substances she is taking if I am to provide her medical care. Drug screen ordered.  On chart review: In 2020, pt had b/l hand abscess, as a sequale of iv drug use, 2d echo was concerning for vegetations on valves, TEE neg for endocarditis but CT did show septic emboli in lungs.      Relevant Orders   Drug Monitoring Panel [id number] , Urine   Other fatigue    Ordered labs; cbc, cmp, iron panel, vit b12.  Pt was a difficult stick today, she reports she will come back hydrated.       Relevant Orders   Vitamin B12   CBC w/Diff   Comp Met (CMET)   IBC + Ferritin   Other Visit Diagnoses  Routine screening for STI (sexually transmitted infection)       Relevant Orders   Urine cytology ancillary only   HIV antibody (with reflex)   RPR       Return if symptoms worsen or fail to improve; will f/u pending labs.     I, Alfredia Ferguson, PA-C have reviewed all documentation for this visit. The documentation on  08/11/22   for the exam, diagnosis, procedures, and orders are all accurate and complete.    Alfredia Ferguson, PA-C  [facility] [employer] [phone]  (phone) [phone] (fax)  [facility]

## 2022-08-10 NOTE — Assessment & Plan Note (Signed)
On chart review, treated with 8 weeks of mavyret. Pt states she has been clean from IV drug use since 2020, but I have doubts 2/2 to her demeanor today. Will repeat hiv rna/ab.

## 2022-08-12 ENCOUNTER — Other Ambulatory Visit (HOSPITAL_COMMUNITY): Payer: Self-pay

## 2022-08-12 ENCOUNTER — Other Ambulatory Visit: Payer: Self-pay | Admitting: Physician Assistant

## 2022-08-12 DIAGNOSIS — A749 Chlamydial infection, unspecified: Secondary | ICD-10-CM

## 2022-08-12 MED ORDER — DOXYCYCLINE HYCLATE 100 MG PO TABS
100.0000 mg | ORAL_TABLET | Freq: Two times a day (BID) | ORAL | 0 refills | Status: AC
  Filled 2022-08-12 – 2022-08-15 (×2): qty 14, 7d supply, fill #0

## 2022-08-12 NOTE — Addendum Note (Signed)
Addended by: Mervin Kung A on: 08/12/2022 07:09 AM   Modules accepted: Orders

## 2022-08-15 ENCOUNTER — Other Ambulatory Visit (HOSPITAL_COMMUNITY): Payer: Self-pay

## 2022-08-15 ENCOUNTER — Other Ambulatory Visit (HOSPITAL_BASED_OUTPATIENT_CLINIC_OR_DEPARTMENT_OTHER): Payer: Self-pay

## 2022-08-17 ENCOUNTER — Other Ambulatory Visit: Payer: Medicaid Other

## 2022-08-29 ENCOUNTER — Telehealth: Payer: Self-pay

## 2022-08-29 NOTE — Telephone Encounter (Signed)
Called patient and was unable to leave a voicemail due to the voicemail box being full. Sending patient a MyChart message.

## 2022-09-19 NOTE — Progress Notes (Deleted)
Established patient visit   Patient: Cheryl Hogan   DOB: Jan 25, 1985   37 y.o. Female  MRN: [id number] Visit Date: 09/20/2022  Today's healthcare provider: Alfredia Ferguson, PA-C   No chief complaint on file.  Subjective    HPI  ***  Medications: Outpatient Medications Prior to Visit  Medication Sig   traZODone (DESYREL) 50 MG tablet Take 0.5-1 tablets (25-50 mg total) by mouth at bedtime as needed for sleep.   No facility-administered medications prior to visit.    Review of Systems    Objective    There were no vitals taken for this visit.  Physical Exam  ***  No results found for any visits on 09/20/22.  Assessment & Plan     ***  No follow-ups on file.      {provider attestation***:1}   Alfredia Ferguson, PA-C  [facility] [phone] (phone) [phone] (fax)  [facility]

## 2022-09-20 ENCOUNTER — Ambulatory Visit: Payer: Medicaid Other | Admitting: Physician Assistant

## 2022-09-28 ENCOUNTER — Ambulatory Visit (INDEPENDENT_AMBULATORY_CARE_PROVIDER_SITE_OTHER): Payer: Medicaid Other | Admitting: Physician Assistant

## 2022-09-28 ENCOUNTER — Telehealth: Payer: Self-pay

## 2022-09-28 ENCOUNTER — Telehealth: Payer: Self-pay | Admitting: Physician Assistant

## 2022-09-28 ENCOUNTER — Encounter: Payer: Self-pay | Admitting: Physician Assistant

## 2022-09-28 ENCOUNTER — Other Ambulatory Visit (HOSPITAL_BASED_OUTPATIENT_CLINIC_OR_DEPARTMENT_OTHER): Payer: Self-pay

## 2022-09-28 VITALS — BP 97/66 | HR 75 | Temp 98.1°F | Resp 16 | Ht 69.0 in | Wt 196.0 lb

## 2022-09-28 DIAGNOSIS — R600 Localized edema: Secondary | ICD-10-CM

## 2022-09-28 DIAGNOSIS — R5383 Other fatigue: Secondary | ICD-10-CM | POA: Diagnosis not present

## 2022-09-28 DIAGNOSIS — R55 Syncope and collapse: Secondary | ICD-10-CM

## 2022-09-28 DIAGNOSIS — F1910 Other psychoactive substance abuse, uncomplicated: Secondary | ICD-10-CM

## 2022-09-28 DIAGNOSIS — R39198 Other difficulties with micturition: Secondary | ICD-10-CM

## 2022-09-28 DIAGNOSIS — Z8619 Personal history of other infectious and parasitic diseases: Secondary | ICD-10-CM

## 2022-09-28 DIAGNOSIS — Z113 Encounter for screening for infections with a predominantly sexual mode of transmission: Secondary | ICD-10-CM

## 2022-09-28 NOTE — Telephone Encounter (Signed)
FYI- appt today at 2:20pm.

## 2022-09-28 NOTE — Telephone Encounter (Signed)
Initial Comment Caller is calling to reschedule appt to be seen today at 1:20 pm. Caller states she has fluids in her legs , tingling and is experiencing pain that is causing pt to pass out. Translation No Nurse Assessment Nurse: D'Heur Ezzard Standing, RN, Adrienne Date/Time (Eastern Time): 09/28/2022 12:04:22 PM Confirm and document reason for call. If symptomatic, describe symptoms. ---Caller states she is having leg swelling with tingling and severe pain. This is a chronic issue. She is c/o tingling in her feet and calves as well as her arms. Does the patient have any new or worsening symptoms? ---Yes Will a triage be completed? ---Yes Related visit to physician within the last 2 weeks? ---No Does the PT have any chronic conditions? (i.e. diabetes, asthma, this includes High risk factors for pregnancy, etc.) ---No Is the patient pregnant or possibly pregnant? (Ask all females between the ages of 12-55) ---No Is this a behavioral health or substance abuse call? ---No Guidelines Guideline Title Affirmed Question Affirmed Notes Nurse Date/Time Lamount Cohen Time) Leg Pain Numbness in a leg or foot (i.e., loss of sensation) D'Heur Ezzard Standing, RN, Adrienne 09/28/2022 12:11:53 PM Disp. Time Lamount Cohen Time) Disposition Final User 09/28/2022 11:59:01 AM Send to Urgent Queue Suanne Marker 09/28/2022 12:16:18 PM See PCP within 24 Hours Yes D'Heur Ezzard Standing, RN, Adrienne PLEASE NOTE: All timestamps contained within this report are represented as [place] Standard Time. CONFIDENTIALTY NOTICE: This fax transmission is intended only for the addressee. It contains information that is legally privileged, confidential or otherwise protected from use or disclosure. If you are not the intended recipient, you are strictly prohibited from reviewing, disclosing, copying using or disseminating any of this information or taking any action in reliance on or regarding this information. If you have received this fax in  error, please notify us immediately by telephone so that we can arrange for its return to [place]. Phone: [phone], Toll-Free: [phone], Fax: [phone] Page: 2 of 2 Call Id: [id number] Final Disposition 09/28/2022 12:16:18 PM See PCP within 24 Hours Yes D'Heur Ezzard Standing, RN, Hansel Starling Caller Disagree/Comply Comply Caller Understands Yes PreDisposition Call Doctor Care Advice Given Per Guideline SEE PCP WITHIN 24 HOURS: * IF OFFICE WILL BE OPEN: You need to be examined within the next 24 hours. Call your doctor (or NP/PA) when the office opens and make an appointment. PAIN MEDICINES: * For pain relief, you can take either acetaminophen, ibuprofen, or naproxen. CALL BACK IF: * Calf swelling or constant leg pain occur * Signs of infection occur (such as spreading redness, warmth, fever) * You become worse CARE ADVICE given per Leg Pain (Adult) guideline. Comments User: Hansel Starling, D'Heur Ezzard Standing, RN Date/Time Lamount Cohen Time): 09/28/2022 12:19:02 PM Warm transferred for appointment. Referrals REFERRED TO PCP OFFICE

## 2022-09-28 NOTE — Addendum Note (Signed)
Addended by: Mertha Finders on: 09/28/2022 05:43 PM   Modules accepted: Orders

## 2022-09-28 NOTE — Telephone Encounter (Signed)
Pt called to schedule an office visit for today. However, she mentioned that she has been experiencing dizziness for weeks and fluids in legs. I transferred her to speak with a triage nurse.

## 2022-09-28 NOTE — Telephone Encounter (Signed)
Triage Nurse called pt stated was having leg swollen/  Pain when standing. Pt agree to appt today at 220 with  [place], [place].

## 2022-09-28 NOTE — Addendum Note (Signed)
Addended by: Mertha Finders on: 09/28/2022 05:44 PM   Modules accepted: Orders

## 2022-09-28 NOTE — Progress Notes (Addendum)
Established patient visit   Patient: Cheryl Hogan   DOB: 1985-02-07   37 y.o. Female  MRN: [id number] Visit Date: 09/28/2022  Today's healthcare provider: Alfredia Ferguson, PA-C   Chief Complaint  Patient presents with   Leg Pain    Severe tingling in legs and arms Starts at bottom of foot and radiates up through body when standing on feet, episodic daily     Loss of Consciousness    7 times in the last 3 weeks, states she knows she has low bp    Subjective    Leg Pain   Loss of Consciousness Associated symptoms include light-headedness. Pertinent negatives include no abdominal pain, chest pain, dizziness, fever or headaches.   Discussed the use of AI scribe software for clinical note transcription with the patient, who gave verbal consent to proceed.  History of Present Illness   The patient, with a history of substance abuse, presents with chronic leg swelling and pain that has been ongoing for over a year. The swelling is constant and leaves indentations when pressed.    The patient also reports frequent fainting episodes, occurring approximately seven times in the last three weeks. These episodes often result in the patient losing consciousness and falling, with subsequent confusion and disorientation upon regaining consciousness. The patient also reports a sensation of pins and needles in their legs and arms when standing still, which they describe as uncomfortable.    The patient also mentions a history of anxiety, which they describe as severe and often resulting in palpitations and shortness of breath.  The patient is currently attending a methadone clinic for substance abuse treatment and admits to concurrent use of crack and an unidentified substance referred to as "animal tranquilizer" that also has heroin in it. The patient expresses a desire to detox from these substances but is concerned about the potential for seizures during withdrawal.       Medications: Outpatient Medications Prior to Visit  Medication Sig   traZODone (DESYREL) 50 MG tablet Take 0.5-1 tablets (25-50 mg total) by mouth at bedtime as needed for sleep.   No facility-administered medications prior to visit.    Review of Systems  Constitutional:  Positive for fatigue. Negative for fever.  Respiratory:  Negative for cough and shortness of breath.   Cardiovascular:  Positive for leg swelling and syncope. Negative for chest pain.  Gastrointestinal:  Negative for abdominal pain.  Neurological:  Positive for syncope and light-headedness. Negative for dizziness and headaches.      Objective    BP 97/66   Pulse 75   Temp 98.1 F (36.7 C) (Oral)   Resp 16   Ht 5\' 9"  (1.753 m)   Wt 196 lb (88.9 kg)   LMP 08/31/2022 (Approximate)   SpO2 98%   BMI 28.94 kg/m   Physical Exam Constitutional:      General: She is awake.     Appearance: She is well-developed.  HENT:     Head: Normocephalic.  Eyes:     Conjunctiva/sclera: Conjunctivae normal.  Cardiovascular:     Rate and Rhythm: Normal rate and regular rhythm.     Heart sounds: Normal heart sounds.  Pulmonary:     Effort: Pulmonary effort is normal.     Breath sounds: Normal breath sounds.  Musculoskeletal:     Comments: Trace edema b/l LE  Skin:    General: Skin is warm.  Neurological:     Mental Status: She is alert and oriented  to person, place, and time.  Psychiatric:        Attention and Perception: Attention normal.        Mood and Affect: Mood normal.        Speech: Speech normal.        Behavior: Behavior is cooperative.      No results found for any visits on 09/28/22.  Assessment & Plan    1. Syncope, unspecified syncope type -would like to properly eval, likely related to substance use, could be seizures or overdose, will start with labs and referral to neurology for seizure r/o as indicated -pt given ED advisement - CK (Creatine Kinase)  2. Localized edema w/  paresthesias --wide etiology, minor swelling today  -will do labs, check kidney function - Urine Microalbumin w/creat. ratio  3. Multiple substance abuse (HCC) -Long discussion of the risks of continued substance use and the benefits of inpatient detoxification and rehabilitation. -Recommend pt self report to [place] ED for detox -declines further help in regards to rehab, no interest in stopping substance use   4. Other fatigue - CBC w/Diff - Comp Met (CMET) - IBC + Ferritin - Vitamin B12  5. History of hepatitis C - Hepatitis C Antibody - Hepatitis C RNA quantitative  6. Routine screening for STI (sexually transmitted infection) - HIV antibody (with reflex) - RPR   Addendum-- Pt reports difficulty urinating and decided to defer labs today. We did get a urine sample, will run a culture to r/o UTI  Plans to reschedule labs.   Return if symptoms worsen or fail to improve, for will f/u with labs.      I, Alfredia Ferguson, PA-C have reviewed all documentation for this visit. The documentation on  09/28/22   for the exam, diagnosis, procedures, and orders are all accurate and complete.    Alfredia Ferguson, PA-C  [facility] at [facility] [phone] (phone) [phone] (fax)  [facility]

## 2022-09-28 NOTE — Addendum Note (Signed)
Addended byAlfredia Ferguson on: 09/28/2022 05:00 PM   Modules accepted: Orders

## 2022-09-28 NOTE — Telephone Encounter (Signed)
Pt came to the lab today following her apt with PCP as instructed. However there was someone In my chair when she came in. She pulled a ticked as I asked. Shortly after the went to use the restroom- I supplied her a cup for her specimen. Once I was finished drawing the pt that I was with I started pulling her orders, she then had to use the restroom once moore. So since it was late and I had another pt waiting, I moved on to the next pt. Meaning she would have to wait more. After a short conversation with a gentleman on the phone the pt got up and said "she was leaving and was going to fine another Dr. Marland Kitchen   No labs were collected today.

## 2022-09-29 LAB — MICROALBUMIN / CREATININE URINE RATIO
Creatinine,U: 7 mg/dL
Microalb Creat Ratio: 10 mg/g (ref 0.0–30.0)
Microalb, Ur: <0.7 mg/dL (ref 0.0–1.9)

## 2022-09-29 LAB — URINE CULTURE
MICRO NUMBER:: 15514703
SPECIMEN QUALITY:: ADEQUATE

## 2022-09-29 NOTE — Telephone Encounter (Signed)
Pt called & lvm to return call to schedule

## 2022-10-01 LAB — URINE CULTURE

## 2022-10-03 ENCOUNTER — Other Ambulatory Visit: Payer: Self-pay | Admitting: Physician Assistant

## 2022-10-03 ENCOUNTER — Other Ambulatory Visit (HOSPITAL_COMMUNITY): Payer: Self-pay

## 2022-10-03 DIAGNOSIS — N3000 Acute cystitis without hematuria: Secondary | ICD-10-CM

## 2022-10-03 MED ORDER — AMOXICILLIN 875 MG PO TABS
875.0000 mg | ORAL_TABLET | Freq: Two times a day (BID) | ORAL | 0 refills | Status: AC
  Filled 2022-10-03: qty 10, 5d supply, fill #0

## 2022-10-04 ENCOUNTER — Telehealth: Payer: Self-pay | Admitting: Physician Assistant

## 2022-10-04 NOTE — Telephone Encounter (Signed)
Can we please call her again to schedule labs.  Any questions about symptoms please just direct her to the ED.

## 2022-10-04 NOTE — Telephone Encounter (Signed)
Tried calling Pt, no answer, no vm to leave message.

## 2022-10-13 ENCOUNTER — Other Ambulatory Visit (HOSPITAL_COMMUNITY): Payer: Self-pay

## 2022-10-19 ENCOUNTER — Ambulatory Visit (HOSPITAL_COMMUNITY)
Admission: EM | Admit: 2022-10-19 | Discharge: 2022-10-19 | Disposition: A | Discharge status: Home / Self Care | Payer: Medicaid Other

## 2022-10-19 NOTE — Progress Notes (Signed)
   10/19/22 1021  [facility] Triage Screening (Walk-ins at [facility] only)  What Is the Reason for Your Visit/Call Today? Cheryl Hogan is a 37 year old female presenting to [facility] unaccompanied. Pt reports she has worsening anixety. Pt also mentions she is not on medication and is looking for help to receive medication for her heroin addiction. Pt reports to use a half of heroin daily. Her last use of heroin was roughly 2 days ago. Pt reports she has used heroin for several years. Pt does report to see a therapist currently but is unsure if the services are helping her. Pt reports she goes to the clinic everyday to get her medication. Pt mentions she went to the clinc today, but wants to stop going because it makes her feel drowsy. Pt is anxious and hyper during triage. Pt has never been to an inpatient treatment center before. Pt denies alcohol use, HI and AVH.  How Long Has This Been Causing You Problems? > than 6 months  Have You Recently Had Any Thoughts About Hurting Yourself? No  Are You Planning to Commit Suicide/Harm Yourself At This time? No  Have you Recently Had Thoughts About Hurting Someone [name]? No  Are You Planning To Harm Someone At This Time? No  Are you currently experiencing any auditory, visual or other hallucinations? No  Have You Used Any Alcohol or Drugs in the Past 24 Hours? Yes  How long ago did you use Drugs or Alcohol? 2 days ago  What Did You Use and How Much? a half of heroin  Do you have any current medical co-morbidities that require immediate attention? No  Clinician description of patient physical appearance/behavior: anxious, hyper  What Do You Feel Would Help You the Most Today? Alcohol or Drug Use Treatment;Medication(s)  If access to [facility] was not available, would you have sought care in the Emergency Department? No  Determination of Need Routine (7 days)  Options For Referral Inpatient Hospitalization;Intensive Outpatient Therapy;Medication Management

## 2022-10-29 ENCOUNTER — Ambulatory Visit (HOSPITAL_COMMUNITY)
Admission: EM | Admit: 2022-10-29 | Discharge: 2022-10-29 | Disposition: A | Discharge status: Other Acute Inpatient Hospital | Payer: Medicaid Other | Attending: Urology | Admitting: Urology

## 2022-10-29 DIAGNOSIS — R400 Somnolence: Secondary | ICD-10-CM | POA: Insufficient documentation

## 2022-10-29 DIAGNOSIS — F1910 Other psychoactive substance abuse, uncomplicated: Secondary | ICD-10-CM | POA: Diagnosis not present

## 2022-10-29 DIAGNOSIS — F32A Depression, unspecified: Secondary | ICD-10-CM | POA: Diagnosis not present

## 2022-10-29 DIAGNOSIS — F11288 Opioid dependence with other opioid-induced disorder: Secondary | ICD-10-CM | POA: Diagnosis not present

## 2022-10-29 DIAGNOSIS — F419 Anxiety disorder, unspecified: Secondary | ICD-10-CM | POA: Insufficient documentation

## 2022-10-29 DIAGNOSIS — F1190 Opioid use, unspecified, uncomplicated: Secondary | ICD-10-CM

## 2022-10-29 LAB — POCT URINE DRUG SCREEN - MANUAL ENTRY (I-SCREEN)
POC Amphetamine UR: NOT DETECTED
POC Buprenorphine (BUP): NOT DETECTED
POC Cocaine UR: POSITIVE — AB
POC Marijuana UR: POSITIVE — AB
POC Methadone UR: POSITIVE — AB
POC Methamphetamine UR: NOT DETECTED
POC Morphine: NOT DETECTED
POC Oxazepam (BZO): NOT DETECTED
POC Oxycodone UR: NOT DETECTED
POC Secobarbital (BAR): NOT DETECTED

## 2022-10-29 NOTE — Progress Notes (Signed)
   10/29/22 1911  [facility] Triage Screening (Walk-ins at [facility] only)  How Did You Hear About [place]? Self  What Is the Reason for Your Visit/Call Today? Pt is 37 yo female who present to [facility] voluntarily. Pt was offered triage but stated she needed to smoke before starting. Pt left AMA.

## 2022-10-29 NOTE — BH Assessment (Signed)
Comprehensive Clinical Assessment (CCA) Note   10/29/2022 Cheryl Hogan [id number]   Disposition: Cheryl Asper, NP recommends Facility Based Crisis  [facility]).   The patient demonstrates the following risk factors for suicide: Chronic risk factors for suicide include: substance use disorder. Acute risk factors for suicide include: loss (financial, interpersonal, professional). Protective factors for this patient include: positive social support. Considering these factors, the overall suicide risk at this point appears to be low. Patient is not appropriate for outpatient follow up.   Pt is a 37 yo who returned back to [facility] voluntarily after leaving AMA to seek detox treatment. Pt reports that she is in need of detox for heroin. Pt reports that she is currently on Methadone. Pt reports that she believes that she has not used any heroin this week. Pt reports that she has an upcoming court case on 10/1 and she needed to seek detox for her upcoming court case. Pt denies SI, HI, and AVH. Pt denies any other drug use. Pt reports that she [facility] the [facility]. Pt denies therapy services .  On evaluation, patient is alert, oriented x 3, and cooperative. Speech is delayed, coherent and logical. Pt appears casual. Eye contact is fair. Mood is anxious and depressed, affect is congruent with mood. Thought process is logical and thought content is coherent. Pt denies SI/HI/AVH. There is no indication that the patient is responding to internal stimuli. No delusions elicited during this assessment.   Chief Complaint: Detox   Visit Diagnosis:   Substance Use Disorder      CCA Screening, Triage and Referral (STR)  Patient Reported Information How did you hear about [place]? Self  What Is the Reason for Your Visit/Call Today? Pt is a 37 yo who returned back to [facility] voluntarily after leaving AMA to seek detox treatment. Pt reports that she is in need of detox for heroin. Pt reports that she is  currently on Methadone. Pt reports that she believes that she has not used any heroin this week. Pt reports that she has an upcoming court case on 10/1 and she needed to seek detox for her upcoming court case. Pt denies SI, HI, and AVH. Pt denies any other drug use. Pt reports that she receives the methadone from [facility]. Pt denies therapy services .  How Long Has This Been Causing You Problems? > than 6 months  What Do You Feel Would Help You the Most Today? Alcohol or Drug Use Treatment   Have You Recently Had Any Thoughts About Hurting Yourself? No  Are You Planning to Commit Suicide/Harm Yourself At This time? No   Flowsheet Row ED from 10/29/2022 in [facility] ED from 10/19/2022 in [facility] ED from 03/24/2022 in [facility] Emergency Department at [place]  C-SSRS RISK CATEGORY No Risk No Risk No Risk       Have you Recently Had Thoughts About Hurting Someone Cheryl Hogan? No  Are You Planning to Harm Someone at This Time? No  Explanation: Pt denies HI   Have You Used Any Alcohol or Drugs in the Past 24 Hours? No  What Did You Use and How Much? n/a   Do You Currently Have a Therapist/Psychiatrist? No  Name of Therapist/Psychiatrist: Name of Therapist/Psychiatrist: Pt denies having a therapist and psychiatrist. She receives Methadone treatment at [facility].   Have You Been Recently Discharged From Any Office Practice or Programs? No  Explanation of Discharge From Practice/Program: N/A  CCA Screening Triage Referral Assessment Type of Contact: Face-to-Face  Telemedicine Service Delivery:   Is this Initial or Reassessment?   Date Telepsych consult ordered in [facility]:    Time Telepsych consult ordered in [facility]:    Location of Assessment: [facility] Assessment Services  Provider Location: [facility] Assessment Services   Collateral Involvement: None   Does Patient Have a Automotive engineer Guardian?  No  Legal Guardian Contact Information: n/a  Copy of Legal Guardianship Form: -- (n/a)  Legal Guardian Notified of Arrival: -- (n/a)  Legal Guardian Notified of Pending Discharge: -- (n/a)  If Minor and Not Living with Parent(s), Who has Custody? n/a  Is CPS involved or ever been involved? Never  Is APS involved or ever been involved? Never   Patient Determined To Be At Risk for Harm To Self or Others Based on Review of Patient Reported Information or Presenting Complaint? No  Method: No Plan  Availability of Means: No access or NA  Intent: Vague intent or NA  Notification Required: No need or identified person  Additional Information for Danger to Others Potential: -- (n/a)  Additional Comments for Danger to Others Potential: n/a  Are There Guns or Other Weapons in Your Home? No  Types of Guns/Weapons: Pt denies guns/ weapons  Are These Weapons Safely Secured?                            Yes  Who Could Verify You Are Able To Have These Secured: Pt denies guns/ weapons  Do You Have any Outstanding Charges, Pending Court Dates, Parole/Probation? Pt reports that she has an pending upcoming court case on 11/04/2022.  Contacted To Inform of Risk of Harm To Self or Others: -- (n/a)    Does Patient Present under Involuntary Commitment? No    [place] of Residence: Cheryl Hogan   Patient Currently Receiving the Following Services: -- (Pt reports receiving Methadone services from [facility].)   Determination of Need: Routine (7 days)   Options For Referral: Outpatient Therapy; Medication Management     CCA Biopsychosocial Patient Reported Schizophrenia/Schizoaffective Diagnosis in Past: No   Strengths: Willingness to seek treatment   Mental Health Symptoms Depression:   Sleep (too much or little); Increase/decrease in appetite   Duration of Depressive symptoms:  Duration of Depressive Symptoms: Greater than two weeks   Mania:   None   Anxiety:     None   Psychosis:   None   Duration of Psychotic symptoms:    Trauma:   None   Obsessions:   None   Compulsions:   None   Inattention:   None   Hyperactivity/Impulsivity:   None   Oppositional/Defiant Behaviors:   None   Emotional Irregularity:   None   Other Mood/Personality Symptoms:   None    Mental Status Exam Appearance and self-care  Stature:   Average   Weight:   Average weight   Clothing:   Disheveled   Grooming:   Neglected   Cosmetic use:   None   Posture/gait:   Normal   Motor activity:   Slowed   Sensorium  Attention:   Confused   Concentration:   Scattered   Orientation:   Time; Situation; Place; Person   Recall/memory:   Defective in Recent   Affect and Mood  Affect:   Flat; Depressed   Mood:   Depressed   Relating  Eye contact:   Avoided   Facial  expression:   Depressed; Sad   Attitude toward examiner:   Guarded; Cooperative   Thought and Language  Speech flow:  Slow; Slurred   Thought content:   Appropriate to Mood and Circumstances   Preoccupation:   None   Hallucinations:   None   Organization:   Coherent   [employer] of Knowledge:   Fair   Intelligence:   Average   Abstraction:   Normal   Judgement:   Impaired   Reality Testing:   Distorted   Insight:   Fair   Decision Making:  Fair  Social Functioning  Social Maturity:   Irresponsible   Social Judgement:   "Street Smart"   Stress  Stressors:   Optometrist Ability:   Exhausted; Overwhelmed   Skill Deficits:   Decision making; Self-control; Responsibility; Self-care   Supports:   Family     Religion: Religion/Spirituality Are You A Religious Person?: No How Might This Affect Treatment?: n/a  Leisure/Recreation: Leisure / Recreation Do You Have Hobbies?: No  Exercise/Diet: Exercise/Diet Do You Exercise?: No Have You Gained or Lost A Significant Amount of Weight in the Past Six  Months?: No Do You Follow a Special Diet?: No Do You Have Any Trouble Sleeping?: No   CCA Employment/Education Employment/Work Situation: Employment / Work Situation Employment Situation: Employed Work Stressors: None reported Patient's Job has Been Impacted by Current Illness: No Has Patient ever Been in Equities trader?: No  Education: Education Is Patient Currently Attending School?: No Last Grade Completed: 12 Did You Product manager?: No Did You Have An Individualized Education Program (IIEP): No Did You Have Any Difficulty At [employer]?: No Patient's Education Has Been Impacted by Current Illness: No   CCA Family/Childhood History Family and Relationship History: Family history Marital status: Single Does patient have children?: No  Childhood History:  Childhood History By whom was/is the patient raised?: Mother Did patient suffer any verbal/emotional/physical/sexual abuse as a child?: No Did patient suffer from severe childhood neglect?: No Has patient ever been sexually abused/assaulted/raped as an adolescent or adult?: No Was the patient ever a victim of a crime or a disaster?: No Witnessed domestic violence?: No Has patient been affected by domestic violence as an adult?: No       CCA Substance Use Alcohol/Drug Use: Alcohol / Drug Use Pain Medications: See MAR Prescriptions: See MAR Over the Counter: See MAR History of alcohol / drug use?: Yes Longest period of sobriety (when/how long): unknown Negative Consequences of Use: Work / Programmer, multimedia, Copywriter, advertising relationships, Armed forces operational officer, Surveyor, quantity Withdrawal Symptoms: None Substance #1 Name of Substance 1: Heroin 1 - Age of First Use: UTA 1 - Amount (size/oz): UTA 1 - Frequency: Daily 1 - Duration: UTA 1 - Last Use / Amount: Per chat, today 1 - Method of Aquiring: IV 1- Route of Use: UTA                       ASAM's:  Six Dimensions of Multidimensional Assessment  Dimension 1:  Acute Intoxication and/or  Withdrawal Potential:      Dimension 2:  Biomedical Conditions and Complications:      Dimension 3:  Emotional, Behavioral, or Cognitive Conditions and Complications:     Dimension 4:  Readiness to Change:     Dimension 5:  Relapse, Continued use, or Continued Problem Potential:     Dimension 6:  Recovery/Living Environment:     ASAM Severity Score:    ASAM Recommended Level  of Treatment: ASAM Recommended Level of Treatment: Level II Intensive Outpatient Treatment   Substance use Disorder (SUD) Substance Use Disorder (SUD)  Checklist Symptoms of Substance Use: Continued use despite having a persistent/recurrent physical/psychological problem caused/exacerbated by use, Continued use despite persistent or recurrent social, interpersonal problems, caused or exacerbated by use  Recommendations for Services/Supports/Treatments: Recommendations for Services/Supports/Treatments Recommendations For Services/Supports/Treatments: Facility Based Crisis  Discharge Disposition:    DSM5 Diagnoses: Patient Active Problem List   Diagnosis Date Noted   Tobacco use 08/10/2022   Multiple substance abuse (HCC) 08/10/2022   Other fatigue 08/10/2022   Depression, major, 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     Referrals to Alternative Service(s): Referred to Alternative Service(s):   Place:   Date:   Time:    Referred to Alternative Service(s):   Place:   Date:   Time:    Referred to Alternative Service(s):   Place:   Date:   Time:    Referred to Alternative Service(s):   Place:   Date:   Time:     Dava Najjar, [place], [facility], NCC

## 2022-10-29 NOTE — ED Notes (Signed)
Patient admitted from [facility] seeking heroine detox. On arrival. Patient A/O X4.  MAE and cooperative. Oriented patient to the unit and unit rules. Denies SI/HI and AVH. Skin assessment done, pt has psoriasis on both knees.  MHT offered patient something to eat.Will monitor for safety.

## 2022-10-30 ENCOUNTER — Other Ambulatory Visit (HOSPITAL_COMMUNITY)
Admission: EM | Admit: 2022-10-30 | Discharge: 2022-10-31 | Disposition: A | Discharge status: Home / Self Care | Payer: Medicaid Other | Attending: Psychiatry | Admitting: Psychiatry

## 2022-10-30 DIAGNOSIS — F1113 Opioid abuse with withdrawal: Secondary | ICD-10-CM | POA: Diagnosis not present

## 2022-10-30 DIAGNOSIS — F1990 Other psychoactive substance use, unspecified, uncomplicated: Secondary | ICD-10-CM | POA: Diagnosis present

## 2022-10-30 DIAGNOSIS — F1590 Other stimulant use, unspecified, uncomplicated: Secondary | ICD-10-CM | POA: Diagnosis present

## 2022-10-30 DIAGNOSIS — F1190 Opioid use, unspecified, uncomplicated: Secondary | ICD-10-CM

## 2022-10-30 LAB — CBC WITH DIFFERENTIAL/PLATELET
Abs Immature Granulocytes: 0.03 10*3/uL (ref 0.00–0.07)
Basophils Absolute: 0.1 10*3/uL (ref 0.0–0.1)
Basophils Relative: 1 %
Eosinophils Absolute: 0.1 10*3/uL (ref 0.0–0.5)
Eosinophils Relative: 1 %
HCT: 43.7 % (ref 36.0–46.0)
Hemoglobin: 14.1 g/dL (ref 12.0–15.0)
Immature Granulocytes: 0 %
Lymphocytes Relative: 28 %
Lymphs Abs: 2.8 10*3/uL (ref 0.7–4.0)
MCH: 26.8 pg (ref 26.0–34.0)
MCHC: 32.3 g/dL (ref 30.0–36.0)
MCV: 83.1 fL (ref 80.0–100.0)
Monocytes Absolute: 0.7 10*3/uL (ref 0.1–1.0)
Monocytes Relative: 7 %
Neutro Abs: 6.2 10*3/uL (ref 1.7–7.7)
Neutrophils Relative %: 63 %
Platelets: 351 10*3/uL (ref 150–400)
RBC: 5.26 MIL/uL — ABNORMAL HIGH (ref 3.87–5.11)
RDW: 15.5 % (ref 11.5–15.5)
WBC: 10 10*3/uL (ref 4.0–10.5)
nRBC: 0 % (ref 0.0–0.2)

## 2022-10-30 LAB — LIPID PANEL
Cholesterol: 201 mg/dL — ABNORMAL HIGH (ref 0–200)
HDL: 59 mg/dL (ref >40)
LDL Cholesterol: 108 mg/dL — ABNORMAL HIGH (ref 0–99)
Total CHOL/HDL Ratio: 3.4 {ratio}
Triglycerides: 172 mg/dL — ABNORMAL HIGH (ref <150)
VLDL: 34 mg/dL (ref 0–40)

## 2022-10-30 LAB — COMPREHENSIVE METABOLIC PANEL
ALT: 19 U/L (ref 0–44)
AST: 20 U/L (ref 15–41)
Albumin: 4.4 g/dL (ref 3.5–5.0)
Alkaline Phosphatase: 71 U/L (ref 38–126)
Anion gap: 11 (ref 5–15)
BUN: 11 mg/dL (ref 6–20)
CO2: 26 mmol/L (ref 22–32)
Calcium: 9.1 mg/dL (ref 8.9–10.3)
Chloride: 101 mmol/L (ref 98–111)
Creatinine, Ser: 0.8 mg/dL (ref 0.44–1.00)
GFR, Estimated: >60 mL/min (ref >60)
Glucose, Bld: 72 mg/dL (ref 70–99)
Potassium: 3.5 mmol/L (ref 3.5–5.1)
Sodium: 138 mmol/L (ref 135–145)
Total Bilirubin: 0.2 mg/dL — ABNORMAL LOW (ref 0.3–1.2)
Total Protein: 7.6 g/dL (ref 6.5–8.1)

## 2022-10-30 LAB — HEMOGLOBIN A1C
Hgb A1c MFr Bld: 5.3 % (ref 4.8–5.6)
Mean Plasma Glucose: 105.41 mg/dL

## 2022-10-30 LAB — ETHANOL: Alcohol, Ethyl (B): <10 mg/dL (ref <10)

## 2022-10-30 LAB — TSH: TSH: 1.543 u[IU]/mL (ref 0.350–4.500)

## 2022-10-30 MED ORDER — DICYCLOMINE HCL 20 MG PO TABS
20.0000 mg | ORAL_TABLET | Freq: Four times a day (QID) | ORAL | Status: DC | PRN
  Administered 2022-10-30: 20 mg via ORAL
  Filled 2022-10-30: qty 1

## 2022-10-30 MED ORDER — METHADONE HCL 10 MG PO TABS
30.0000 mg | ORAL_TABLET | Freq: Every day | ORAL | Status: DC
  Administered 2022-10-30 – 2022-10-31 (×2): 30 mg via ORAL
  Filled 2022-10-30 (×2): qty 3

## 2022-10-30 MED ORDER — HYDROXYZINE HCL 25 MG PO TABS
25.0000 mg | ORAL_TABLET | Freq: Four times a day (QID) | ORAL | Status: DC | PRN
  Administered 2022-10-30 – 2022-10-31 (×4): 25 mg via ORAL
  Filled 2022-10-30 (×4): qty 1

## 2022-10-30 MED ORDER — MAGNESIUM HYDROXIDE 400 MG/5ML PO SUSP: 30.0000 mL | Freq: Every day | ORAL | Status: DC | PRN

## 2022-10-30 MED ORDER — TRAZODONE HCL 50 MG PO TABS
50.0000 mg | ORAL_TABLET | Freq: Every evening | ORAL | Status: DC | PRN
  Administered 2022-10-30: 50 mg via ORAL
  Filled 2022-10-30: qty 1

## 2022-10-30 MED ORDER — METHOCARBAMOL 500 MG PO TABS
500.0000 mg | ORAL_TABLET | Freq: Three times a day (TID) | ORAL | Status: DC | PRN
  Administered 2022-10-30 – 2022-10-31 (×2): 500 mg via ORAL
  Filled 2022-10-30 (×2): qty 1

## 2022-10-30 MED ORDER — LOPERAMIDE HCL 2 MG PO CAPS: 2.0000 mg | ORAL_CAPSULE | ORAL | Status: DC | PRN

## 2022-10-30 MED ORDER — ALUM & MAG HYDROXIDE-SIMETH 200-200-20 MG/5ML PO SUSP: 30.0000 mL | ORAL | Status: DC | PRN

## 2022-10-30 MED ORDER — ONDANSETRON 4 MG PO TBDP: 4.0000 mg | ORAL_TABLET | Freq: Four times a day (QID) | ORAL | Status: DC | PRN

## 2022-10-30 MED ORDER — NAPROXEN 500 MG PO TABS
500.0000 mg | ORAL_TABLET | Freq: Two times a day (BID) | ORAL | Status: DC | PRN
  Administered 2022-10-30 – 2022-10-31 (×2): 500 mg via ORAL
  Filled 2022-10-30 (×2): qty 1

## 2022-10-30 MED ORDER — ACETAMINOPHEN 325 MG PO TABS: 650.0000 mg | ORAL_TABLET | Freq: Four times a day (QID) | ORAL | Status: DC | PRN

## 2022-10-30 NOTE — ED Notes (Signed)
Pt laying on bed resting in no acute distress. RR even and unlabored. Environment secured. Will continue to monitor for safety.

## 2022-10-30 NOTE — Group Note (Signed)
Group Topic: Positive Affirmations  Group Date: 10/30/2022 Start Time: 0900 End Time: 1000 Facilitators: Prentice Docker, RN  Department: [facility]  Number of Participants: 4  Group Focus: affirmation Treatment Modality:  Patient-Centered Therapy Interventions utilized were leisure development Purpose: regain self worth  Name: Cheryl Hogan Date of Birth: 07/03/1985  MR: [id number]    Level of Participation: active Quality of Participation: cooperative Interactions with others: gave feedback Mood/Affect: appropriate Triggers (if applicable): withdrawal sx Cognition: coherent/clear Progress: Gaining insight Response: "I am trying which is more than I was doing before" Plan: patient will be encouraged to continue tx compliance and goal focus  Patients Problems:  Patient Active Problem List   Diagnosis Date Noted   Substance use disorder 10/30/2022   Tobacco use 08/10/2022   Multiple substance abuse (HCC) 08/10/2022   Other fatigue 08/10/2022   Depression, major, 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

## 2022-10-30 NOTE — ED Notes (Signed)
Pt laying in bed in no acute distress. RR even and unlabored. Environment secured. Will continue to monitor for safety.

## 2022-10-30 NOTE — ED Provider Notes (Cosign Needed Addendum)
Behavioral Health Urgent Care Medical Screening Exam  Patient Name: Cheryl Hogan MRN: [id number] Date of Evaluation: 10/30/22 Chief Complaint:   Diagnosis:  Final diagnoses:  Substance abuse (HCC)  Opioid use disorder    History of Present illness: Cheryl Hogan is a 37 y.o. female with psychiatric history of depression, anxiety, and opioid use disorder.  Patient presented voluntarily to [facility] requesting detox and substance abuse treatment.  Patient was evaluated face-to-face and her chart was reviewed by this nurse practitioner.  On assessment, patient is drowsy and having a hard time staying awake during assessment. She reports that she is anxious and has been experiencing increased depressive symptoms for several months. She identifies an upcoming court date as her main trigger. She is guarded about her court case and does not want to discuss her legal issues. She reports that she needs to participate in a substance abuse treatment program prior to her court date. She admits to a history of opiate abuse.  She says she is prescribed Methadone 80mg /day and goes to a methadone clinic [facility]) for her daily dose. She reports that her current methadone dose is ineffective and she has been using IV heroine daily due to methadone wearing off too fast. She also report that she uses heroine to treat depression and to calm her anxiety. She is endorsing depressive symptoms of hopelessness, worthlessness, irritability, anxiety, crying spells, poor sleep, low mood, and forgetfulness.   She denies suicidal and homicidal ideation, hallucination, and paranoia. She admits to using $20 worth of IV heroine  daily; she denies all other substance abuse.   Patient is casually dressed, drowsy but oriented x 4. She is irritable, anxious but cooperative. She is speaking in a clear tone, moderate volume and slow pace. Her mood is anxious and irritable, affect is congruent, her thought process is coherent. No  evidence of psychosis, mania, delusion thought content.   Admit to [facility] for detox and stabilization.     Flowsheet Row ED from 10/29/2022 in [facility] ED from 10/19/2022 in [facility] ED from 03/24/2022 in [facility] Emergency Department at [facility]  C-SSRS RISK CATEGORY No Risk No Risk No Risk       Psychiatric Specialty Exam  Presentation  General Appearance:Casual  Eye Contact:Good  Speech:Clear and Coherent  Speech Volume:Normal  Handedness:Right   Mood and Affect  Mood: Anxious; Depressed; Irritable  Affect: Congruent   Thought Process  Thought Processes: Coherent  Descriptions of Associations:Intact  Orientation:Full (Time, Place and Person)  Thought Content:WDL  Diagnosis of Schizophrenia or Schizoaffective disorder in past: No   Hallucinations:None  Ideas of Reference:None  Suicidal Thoughts:No  Homicidal Thoughts:No   Sensorium  Memory: Immediate Fair; Recent Poor; Remote Poor  Judgment: Fair  Insight: Good   Executive Functions  Concentration: Fair  Attention Span: Fair  Recall: Fair  Fund of Knowledge: Fair  Language: Fair   Psychomotor Activity  Psychomotor Activity: Normal   Assets  Assets: Manufacturing systems engineer; Desire for Improvement; Physical Health   Sleep  Sleep: Poor  Number of hours:  1.5   Physical Exam: Physical Exam Vitals reviewed.  Constitutional:      General: She is not in acute distress.    Appearance: She is well-developed. She is not toxic-appearing.  HENT:     Head: Normocephalic and atraumatic.  Cardiovascular:     Rate and Rhythm: Normal rate.  Pulmonary:     Effort: Pulmonary effort is normal.  Musculoskeletal:  General: Normal range of motion.     Cervical back: Normal range of motion.  Skin:    General: Skin is dry.  Neurological:     Mental Status: She is alert and oriented to person, place,  and time.  Psychiatric:        Attention and Perception: Attention and perception normal.        Mood and Affect: Mood normal.        Speech: Speech is slurred.        Behavior: Behavior normal. Behavior is cooperative.        Thought Content: Thought content normal.        Cognition and Memory: Cognition normal.    Review of Systems  Constitutional: Negative.   HENT: Negative.    Eyes: Negative.   Respiratory: Negative.    Cardiovascular: Negative.   Gastrointestinal: Negative.   Genitourinary: Negative.   Musculoskeletal: Negative.   Skin: Negative.   Neurological: Negative.   Endo/Heme/Allergies: Negative.   Psychiatric/Behavioral:  Positive for depression and substance abuse. The patient is nervous/anxious.    Blood pressure 126/79, pulse 83, temperature 98.7 F (37.1 C), temperature source Oral, resp. rate 20, SpO2 95%. There is no height or weight on file to calculate BMI.  Musculoskeletal: Strength & Muscle Tone: within normal limits Gait & Station: normal Patient leans: Right   [facility] MSE Discharge Disposition for Follow up and Recommendations: Based on my evaluation the patient does not appear to have an emergency medical condition and can be discharged with resources and follow up care in outpatient services for She will be admitted to [facility] for stabilization, detox, and substance abuse treatment.   Maricela Bo, NP 10/30/2022, 4:59 AM

## 2022-10-30 NOTE — ED Notes (Addendum)
Patient is in the bedroom sleeping. NAD. Seen comfortable in the bed.  Will monitor for safety.

## 2022-10-30 NOTE — ED Provider Notes (Signed)
Behavioral Health Progress Note  Date and Time: 10/30/2022 10:48 AM Name: Cheryl Hogan MRN:  [id number]  Subjective:   The patient is a 37 year old female with a longstanding past psychiatric history of opioid use disorder, severe.  3 years the patient ago the patient was medically admitted with sepsis from IV drug use infection.  The patient reports that she has been homeless for several years and has limited social support.  She came to the [facility] behavioral urgent care requesting substance use treatment.  She was admitted to the facility based crisis.  Patient denied experiencing any suicidal thoughts.  She denies previous history of suicide attempts or psychiatric admission, congruent with my review of the chart.  She reports present withdrawal symptoms including restlessness, myalgias, weakness, and hot and cold temperature changes.  She reports that her last use of heroin was yesterday.  She reports regular follow-up with [facility].  She is able to report that her dose is 80 mg daily.  She states that her last dose was yesterday morning and that she has been fully adherent with her methadone despite abusing heroin.  Urine drug screen was positive for methadone.  Urine drug screen is also noted to be positive for cocaine and marijuana.  Patient admits to using these substances.  Patient denies the use of any alcohol.  The patient reported previously that she had an upcoming court date and needs to be receiving substance use treatment.  Patient declined residential rehab today and said that she wanted to detox and then leave.  We will continue to engage the patient.  Diagnosis:  Final diagnoses:  Opioid use disorder    Total Time spent with patient: 20 minutes  Past Psychiatric History: as above Past Medical History: as above Family History: none Family Psychiatric  History: none Social History: as above and per H and P  Additional Social History:  See H  and P                  Sleep: Fair  Appetite:  Fair   Current Medications:  Current Facility-Administered Medications  Medication Dose Route Frequency Provider Last Rate Last Admin   acetaminophen (TYLENOL) tablet 650 mg  650 mg Oral Q6H PRN Ajibola, Ene A, NP       alum & mag hydroxide-simeth (MAALOX/MYLANTA) 200-200-20 MG/5ML suspension 30 mL  30 mL Oral Q4H PRN Ajibola, Ene A, NP       dicyclomine (BENTYL) tablet 20 mg  20 mg Oral Q6H PRN Ajibola, Ene A, NP   20 mg at 10/30/22 0840   hydrOXYzine (ATARAX) tablet 25 mg  25 mg Oral Q6H PRN Ajibola, Ene A, NP   25 mg at 10/30/22 2130   loperamide (IMODIUM) capsule 2-4 mg  2-4 mg Oral PRN Ajibola, Ene A, NP       magnesium hydroxide (MILK OF MAGNESIA) suspension 30 mL  30 mL Oral Daily PRN Ajibola, Ene A, NP       methadone (DOLOPHINE) tablet 30 mg  30 mg Oral Daily Carlyn Reichert, MD   30 mg at 10/30/22 1038   methocarbamol (ROBAXIN) tablet 500 mg  500 mg Oral Q8H PRN Ajibola, Ene A, NP   500 mg at 10/30/22 0840   naproxen (NAPROSYN) tablet 500 mg  500 mg Oral BID PRN Ajibola, Ene A, NP       ondansetron (ZOFRAN-ODT) disintegrating tablet 4 mg  4 mg Oral Q6H PRN Ajibola, Ene A, NP  traZODone (DESYREL) tablet 50 mg  50 mg Oral QHS PRN Ajibola, Ene A, NP       No current outpatient medications on file.    Labs  Lab Results:  Admission on 10/29/2022, Discharged on 10/29/2022  Component Date Value Ref Range Status   WBC 10/29/2022 10.0  4.0 - 10.5 K/uL Final   RBC 10/29/2022 5.26 (H)  3.87 - 5.11 MIL/uL Final   Hemoglobin 10/29/2022 14.1  12.0 - 15.0 g/dL Final   HCT 16/10/9602 43.7  36.0 - 46.0 % Final   MCV 10/29/2022 83.1  80.0 - 100.0 fL Final   MCH 10/29/2022 26.8  26.0 - 34.0 pg Final   MCHC 10/29/2022 32.3  30.0 - 36.0 g/dL Final   RDW 54/09/8117 15.5  11.5 - 15.5 % Final   Platelets 10/29/2022 351  150 - 400 K/uL Final   nRBC 10/29/2022 0.0  0.0 - 0.2 % Final   Neutrophils Relative % 10/29/2022 63  % Final    Neutro Abs 10/29/2022 6.2  1.7 - 7.7 K/uL Final   Lymphocytes Relative 10/29/2022 28  % Final   Lymphs Abs 10/29/2022 2.8  0.7 - 4.0 K/uL Final   Monocytes Relative 10/29/2022 7  % Final   Monocytes Absolute 10/29/2022 0.7  0.1 - 1.0 K/uL Final   Eosinophils Relative 10/29/2022 1  % Final   Eosinophils Absolute 10/29/2022 0.1  0.0 - 0.5 K/uL Final   Basophils Relative 10/29/2022 1  % Final   Basophils Absolute 10/29/2022 0.1  0.0 - 0.1 K/uL Final   Immature Granulocytes 10/29/2022 0  % Final   Abs Immature Granulocytes 10/29/2022 0.03  0.00 - 0.07 K/uL Final   Performed at [facility], [address] [place], [place] [address]   Sodium 10/29/2022 138  135 - 145 mmol/L Final   Potassium 10/29/2022 3.5  3.5 - 5.1 mmol/L Final   Chloride 10/29/2022 101  98 - 111 mmol/L Final   CO2 10/29/2022 26  22 - 32 mmol/L Final   Glucose, Bld 10/29/2022 72  70 - 99 mg/dL Final   Glucose reference range applies only to samples taken after fasting for at least 8 hours.   BUN 10/29/2022 11  6 - 20 mg/dL Final   Creatinine, Ser 10/29/2022 0.80  0.44 - 1.00 mg/dL Final   Calcium 95/62/1308 9.1  8.9 - 10.3 mg/dL Final   Total Protein 65/78/4696 7.6  6.5 - 8.1 g/dL Final   Albumin 29/52/8413 4.4  3.5 - 5.0 g/dL Final   AST 24/40/1027 20  15 - 41 U/L Final   ALT 10/29/2022 19  0 - 44 U/L Final   Alkaline Phosphatase 10/29/2022 71  38 - 126 U/L Final   Total Bilirubin 10/29/2022 0.2 (L)  0.3 - 1.2 mg/dL Final   GFR, Estimated 10/29/2022 >60  >60 mL/min Final   Comment: (NOTE) Calculated using the CKD-EPI Creatinine Equation (2021)    Anion gap 10/29/2022 11  5 - 15 Final   Performed at [facility], [address], [place], [place] [address]   Cholesterol 10/29/2022 201 (H)  0 - 200 mg/dL Final   Triglycerides 44/03/4740 172 (H)  <150 mg/dL Final   HDL 59/56/3875 59  >40 mg/dL Final   Total CHOL/HDL Ratio 10/29/2022 3.4  RATIO Final   VLDL 10/29/2022 34  0 - 40 mg/dL Final   LDL  Cholesterol 10/29/2022 108 (H)  0 - 99 mg/dL Final   Comment:  Total Cholesterol/HDL:CHD Risk Coronary Heart Disease Risk Table                     Men   Women  1/2 Average Risk   3.4   3.3  Average Risk       5.0   4.4  2 X Average Risk   9.6   7.1  3 X Average Risk  23.4   11.0        Use the calculated Patient Ratio above and the CHD Risk Table to determine the patient's CHD Risk.        ATP III CLASSIFICATION (LDL):  <100     mg/dL   Optimal  191-478  mg/dL   Near or Above                    Optimal  130-159  mg/dL   Borderline  295-621  mg/dL   High  >308     mg/dL   Very High Performed at [facility], [address], [place], [place] [address]    Hgb A1c MFr Bld 10/29/2022 5.3  4.8 - 5.6 % Final   Comment: (NOTE) Pre diabetes:          5.7%-6.4%  Diabetes:              >6.4%  Glycemic control for   <7.0% adults with diabetes    Mean Plasma Glucose 10/29/2022 105.41  mg/dL Final   Performed at [facility], [address], [place], [place] [address]   TSH 10/29/2022 1.543  0.350 - 4.500 uIU/mL Final   Comment: Performed by a 3rd Generation assay with a functional sensitivity of <=0.01 uIU/mL. Performed at [facility], [address] [place], [place] [address]    Alcohol, Ethyl (B) 10/29/2022 <10  <10 mg/dL Final   Comment: (NOTE) Lowest detectable limit for serum alcohol is 10 mg/dL.  For medical purposes only. Performed at [facility], [address] [place], [place] [address]    POC Amphetamine UR 10/29/2022 None Detected  NONE DETECTED (Cut Off Level 1000 ng/mL) Final   POC Secobarbital (BAR) 10/29/2022 None Detected  NONE DETECTED (Cut Off Level 300 ng/mL) Final   POC Buprenorphine (BUP) 10/29/2022 None Detected  NONE DETECTED (Cut Off Level 10 ng/mL) Final   POC Oxazepam (BZO) 10/29/2022 None Detected  NONE DETECTED (Cut Off Level 300 ng/mL) Final   POC Cocaine UR 10/29/2022 Positive (A)  NONE DETECTED (Cut Off Level 300  ng/mL) Final   POC Methamphetamine UR 10/29/2022 None Detected  NONE DETECTED (Cut Off Level 1000 ng/mL) Final   POC Morphine 10/29/2022 None Detected  NONE DETECTED (Cut Off Level 300 ng/mL) Final   POC Methadone UR 10/29/2022 Positive (A)  NONE DETECTED (Cut Off Level 300 ng/mL) Final   POC Oxycodone UR 10/29/2022 None Detected  NONE DETECTED (Cut Off Level 100 ng/mL) Final   POC Marijuana UR 10/29/2022 Positive (A)  NONE DETECTED (Cut Off Level 50 ng/mL) Final  Office Visit on 09/28/2022  Component Date Value Ref Range Status   Microalb, Ur 09/28/2022 <0.7  0.0 - 1.9 mg/dL Final   Creatinine,U 10/30/2534 7.0  mg/dL Final   Microalb Creat Ratio 09/28/2022 10.0  0.0 - 30.0 mg/g Final   MICRO NUMBER: 09/28/2022 [id number]   Final   SPECIMEN QUALITY: 09/28/2022 Adequate   Final   Sample Source 09/28/2022 URINE   Final   STATUS:  09/28/2022 FINAL   Final   Result: 09/28/2022 Less than 10,000 CFU/mL of single Gram positive organism isolated. No further testing will be performed. If clinically indicated, recollection using a method to minimize contamination, with prompt transfer to Urine Culture Transport Tube, is recommended.   Final   ISOLATE 1: 09/28/2022 Streptococcus agalactiae (A)   Final   Comment: 1,000-9,000 CFU/ML of Group B Streptococcus isolated Beta-hemolytic streptococci are predictably susceptible to Penicillin and other beta-lactams. Susceptibility testing not routinely performed. Please contact the laboratory within 3 days if  susceptibility testing is desired. Erythromycin and clindamycin are not recommended for treatment of urinary tract infections, but clindamycin may be useful for treatment of rectovaginal colonization or infection. Any amount of group B Streptococcus in  urine specimens obtained from pregnant females is a marker of genital tract colonization. If this patient is pregnant, please refer to ACOG guidelines for appropriate screening and management of pregnant women.    Office Visit on 08/10/2022  Component Date Value Ref Range Status   [facility] Summary 08/10/2022    Final   Comment:     Prescribed            Prescribed            Not Prescribed     Consistent            Inconsistent          Inconsistent     ----------            ------------          --------------                                                 Benzoylecgonine                                                 Methamphetamine .    Amphetamines 08/10/2022 POSITIVE (A)  <500 ng/mL Final   Amphetamine 08/10/2022 NEGATIVE  <250 ng/mL Final   Methamphetamine 08/10/2022 988 (H)  <250 ng/mL Final   medMATCH Methamphetamine 08/10/2022 INCONSISTENT (A)   Final   Amphetamines Comments 08/10/2022    Final   See Amphetamines Notes, LDT Notes   Barbiturates 08/10/2022 NEGATIVE  <300 ng/mL Final   Benzodiazepines 08/10/2022 NEGATIVE  <100 ng/mL Final   Cocaine Metabolite 08/10/2022 POSITIVE (A)  <150 ng/mL Final   Benzoylecgonine 08/10/2022 >15,000 (H)  <100 ng/mL Final   medMATCH Benzoylecgonine 08/10/2022 INCONSISTENT (A)   Final   Cocaine Comments 08/10/2022    Final   See Cocaine Notes, LDT Notes   Opiates 08/10/2022 NEGATIVE  <100 ng/mL Final   Oxycodone 08/10/2022 NEGATIVE  <100 ng/mL Final   Desmethyltramadol 08/10/2022 NEGATIVE  <100 ng/mL Final   Tramadol 08/10/2022 NEGATIVE  <100 ng/mL Final   Tramadol Comments 08/10/2022    Final   See LDT Notes   Preg Test, Ur 08/10/2022 Negative  Negative Final   Neisseria Gonorrhea 08/10/2022 Negative   Final   Chlamydia 08/10/2022 Positive (A)   Final   Trichomonas 08/10/2022 Negative   Final   Comment 08/10/2022 Normal Reference Range Trichomonas - Negative   Final   Comment 08/10/2022 Normal Reference Ranger Chlamydia - Negative  Final   Comment 08/10/2022 Normal Reference Range Neisseria Gonorrhea - Negative   Final   Notes and Comments 08/10/2022    Final   Comment: This drug testing is for medical treatment only. Analysis was performed  as non-forensic testing and these results should be used only by healthcare providers to render diagnosis or treatment, or to monitor progress of medical conditions. . Amphetamines Notes: Methamphetamine detected is consistent with the use of  the drug Methamphetamine. . The metabolite Amphetamine is not present at or above  the cutoff. . Cocaine Notes: Benzoylecgonine detected is consistent with the use of  the drug Cocaine. Marland Kitchen LDT Notes: Confirmation tests were developed and their analytical  performance characteristics have been determined by  [employer]. It has not been cleared or approved  by the FDA. This assay has been validated pursuant to  the CLIA regulations and is used for clinical purposes. . . medMATCH(R) enables providers to identify if drug use is consistent or inconsistent with a corresponding prescribed medication(s) list. . . Healthcare Providers needing Interp                          retation assistance,  please contact us at [phone] ([phone])  M-F, 8am to 10pm EST     Blood Alcohol level:  Lab Results  Component Value Date   ETH <10 10/29/2022    Metabolic Disorder Labs: Lab Results  Component Value Date   HGBA1C 5.3 10/29/2022   MPG 105.41 10/29/2022   No results found for: "PROLACTIN" Lab Results  Component Value Date   CHOL 201 (H) 10/29/2022   TRIG 172 (H) 10/29/2022   HDL 59 10/29/2022   CHOLHDL 3.4 10/29/2022   VLDL 34 10/29/2022   LDLCALC 108 (H) 10/29/2022    Therapeutic Lab Levels: No results found for: "LITHIUM" No results found for: "VALPROATE" No results found for: "CBMZ"  Physical Findings   GAD-7    Flowsheet Row Video Visit from 03/24/2020 in [facility] Office Visit from 08/14/2018 in [facility] Office Visit from 03/06/2018 in [facility] Office Visit from 02/06/2018 in [facility]  Total GAD-7  Score 0 19 12 17       PHQ2-9    Flowsheet Row ED from 10/30/2022 in [facility] Most recent reading at 10/30/2022 10:43 AM Office Visit from 04/21/2020 in [facility] Most recent reading at 04/21/2020 10:49 AM Office Visit from 04/07/2020 in [facility] Most recent reading at 04/07/2020 10:52 AM Office Visit from 03/24/2020 in [facility] Most recent reading at 03/24/2020  3:49 PM [facility] Most recent reading at 03/24/2020  2:13 PM  PHQ-2 Total Score 2 0 0 0 0  PHQ-9 Total Score 8 0 0 1 0      Flowsheet Row ED from 10/29/2022 in [facility] ED from 10/19/2022 in [facility] ED from 03/24/2022 in [facility] Emergency Department at [facility]  C-SSRS RISK CATEGORY No Risk No Risk No Risk        Musculoskeletal  Strength & Muscle Tone: within normal limits Gait & Station: normal Patient leans: N/A  Psychiatric Specialty Exam  Presentation General Appearance: Appropriate for Environment  Eye Contact:Fair  Speech:Clear and Coherent  Speech Volume:Normal  Handedness:-- (not assessed)  Mood and Affect  Mood:Euthymic  Affect:Congruent   Thought Process  Thought Processes:Coherent; Linear  Descriptions of Associations:Intact  Orientation:Full (Time, Place and Person)  Thought Content:Logical    Hallucinations:Hallucinations: None  Ideas of Reference:None  Suicidal Thoughts:Suicidal Thoughts: No  Homicidal Thoughts:Homicidal Thoughts: No   Sensorium  Memory:Immediate Fair; Recent Fair; Remote Fair  Judgment:Fair  Insight:Fair   Executive Functions  Concentration:Fair  Attention Span:Fair  Recall:Fair  Fund of Knowledge:Fair  Language:Fair   Psychomotor Activity  Psychomotor Activity:Psychomotor Activity:  Normal   Assets  Assets:Communication Skills; Resilience   Sleep  Sleep:Sleep: Fair   Nutritional Assessment (For OBS and FBC admissions only) Has the patient had a weight loss or gain of 10 pounds or more in the last 3 months?: No Has the patient had a decrease in food intake/or appetite?: Yes Does the patient have dental problems?: No Does the patient have eating habits or behaviors that may be indicators of an eating disorder including binging or inducing vomiting?: No Has the patient recently lost weight without trying?: 0 Has the patient been eating poorly because of a decreased appetite?: 0 Malnutrition Screening Tool Score: 0    Physical Exam Constitutional:      Appearance: the patient is not toxic-appearing.  Pulmonary:     Effort: Pulmonary effort is normal.  Neurological:     General: No focal deficit present.     Mental Status: the patient is alert and oriented to person, place, and time.   Review of Systems  Respiratory:  Negative for shortness of breath.   Cardiovascular:  Negative for chest pain.  Gastrointestinal:  Negative for abdominal pain, constipation, diarrhea, nausea and vomiting.  Neurological:  Negative for headaches.    BP 109/65 (BP Location: Right Arm)   Pulse 68   Temp 97.7 F (36.5 C) (Oral)   Resp 18   SpO2 100%   Assessment and Plan:  Opioid use disorder, presently in withdrawal - COWS with as needed medication - Restart methadone at reduced dose of 30 mg daily - I called [facility] but they were unable to connect me with the onsite staff for the [facility] location.  They were unable to provide me with any information.  For now we will restart her methadone at a typical starting dose for a naive patient.  We can reassess and add more, especially if we get confirmation from [facility] tomorrow. - Patient declined hepatitis and HIV testing  Routine lab work and EKG unremarkable  Need to follow-up urine pregnancy  test     Carlyn Reichert, MD 10/30/2022 10:48 AM

## 2022-10-30 NOTE — ED Notes (Signed)
Pt sleeping in no acute distress. RR even and unlabored. Environment secured. Will continue to monitor for safety. 

## 2022-10-30 NOTE — ED Notes (Addendum)
Patient is in bedroom calm and composed. Denies SI/HI/AVH.  NAD. Respirations are even and unlabored. Will continue to monitor for safety.

## 2022-10-30 NOTE — ED Notes (Addendum)
Patient A&Ox4. Denies intent to harm self/others when asked. Denies A/VH. Patient c/o muscle aches and abd cramping. Writer observed excessive yawning, sniffles, restlessness and fidgety with increased anxiety. Pt eating bkft. Pt states using heroin yesterday before coming to facility. Pleasant with staff. Pt given Robaxin and Bentyl for sx mgmt. COWS 13. Awaiting further orders from provider, whom just completed assessent with pt. Support and encouragement provided. Routine safety checks conducted according to facility protocol. Encouraged patient to notify staff if thoughts of harm toward self or others arise. Patient verbalize understanding and agreement. Will continue to monitor for safety.

## 2022-10-30 NOTE — Group Note (Signed)
Group Topic: Communication  Group Date: 10/30/2022 Start Time: 1930 End Time: 2030 Facilitators: Rae Lips B  Department: [facility]  Number of Participants: 2  Group Focus: abuse issues, activities of daily living skills, anger management, anxiety, clarity of thought, communication, coping skills, family, feeling awareness/expression, forgiveness, goals/reality orientation, healthy friendships, individual meeting, problem solving, relapse prevention, relaxation, self-awareness, social skills, and substance abuse education Treatment Modality:  Patient-Centered Therapy Interventions utilized were problem solving, story telling, and support Purpose: enhance coping skills, explore maladaptive thinking, express feelings, express irrational fears, and relapse prevention strategies  Name: Margaree Trinka Date of Birth: 28-Jun-1985  MR: [id number]    Level of Participation: PT DID NOT ATTEND GROUP Quality of Participation: attentive and cooperative Interactions with others: gave feedback Mood/Affect: appropriate Triggers (if applicable): NA Cognition: coherent/clear Progress: None Response: PT did not attend group Plan: patient will be encouraged to go to groups.   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

## 2022-10-30 NOTE — ED Notes (Signed)
Pt had about 10 needles a crack pipe and a knife on her when she came in the building MHT went through belonging with permission from PT saying we could throw away the things in her bag with security watching and on video in the locker room. Russ locked up the knife for the Pt and MHT go rid of needles and pipes in the sharp objects container.

## 2022-10-30 NOTE — Group Note (Signed)
Group Topic: Change and Accountability  Group Date: 10/30/2022 Start Time: 1712 End Time: 1725 Facilitators: Vonzell Schlatter B  Department: [facility]  Number of Participants: 4 Group Focus: affirmation Treatment Modality:  Psychoeducation Interventions utilized were support Purpose: express feelings  Name: Cheryl Hogan Date of Birth: 1985/04/30  MR: [id number]    Level of Participation: moderate Quality of Participation: attentive and cooperative Interactions with others: gave feedback Mood/Affect: positive Triggers (if applicable): n/a Cognition: coherent/clear Progress: Moderate Response: n/a Plan: follow-up needed  Patients Problems:  Patient Active Problem List   Diagnosis Date Noted   Substance use disorder 10/30/2022   Tobacco use 08/10/2022   Multiple substance abuse (HCC) 08/10/2022   Other fatigue 08/10/2022   Depression, major, 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

## 2022-10-31 DIAGNOSIS — F1113 Opioid abuse with withdrawal: Secondary | ICD-10-CM | POA: Diagnosis not present

## 2022-10-31 MED ORDER — BUPRENORPHINE HCL-NALOXONE HCL 8-2 MG SL SUBL: 1.0000 | SUBLINGUAL_TABLET | Freq: Every day | SUBLINGUAL | Status: DC

## 2022-10-31 NOTE — ED Notes (Signed)
Patient in the bedroom sleeping, comfortable. NAD. Respirations are even and unlabored. Will continue to monitor for safety.

## 2022-10-31 NOTE — ED Notes (Signed)
Patient currently communicating with the provider.

## 2022-10-31 NOTE — ED Notes (Signed)
Patient d/c to community in stable condition with all belonging. Patient denied SI, HI, AVH. There are no indications of responses to internal stimuli.

## 2022-10-31 NOTE — ED Provider Notes (Signed)
FBC/OBS ASAP Discharge Summary  Date and Time: 10/31/2022 12:37 PM  Name: Cheryl Hogan  MRN:  [id number]   Discharge Diagnoses:  Final diagnoses:  Opioid use disorder   Reason for admission: Cheryl Hogan is a 37 yo female with a longstanding past psychiatric history of opioid use disorder, severe. 3 years the patient ago the patient was medically admitted with sepsis from IV drug use infection. The patient reports that she has been homeless for several years and has limited social support. She came to the [facility] behavioral urgent care requesting substance use treatment. She was admitted to the facility based crisis and later cited worsening cravings no motivation for rehabilitation as reasons to be discharged on 10/31/22 .  Subjective:  On assessment today, the patient requested to be discharged. She has an extensive hx of heroin use. She reports worsening withdrawals and cravings, does not wish to switch to suboxone in order to be accepted to residential rehabilitation facilities. When asked about her motivation to get clean, she is unable to confirm any significant motivation to continue this hospitalization. She reports she has a "sugar daddy" who provides her financial support and who wants her to get sober. The patient is insistent she be discharged this afternoon and has no safety concerns. She denies SI, HI, and AVH.   Stay Summary: During the patient's hospitalization at the [facility], patient had extensive initial psychiatric evaluation, with daily follow-up assessments focused on detoxification management.  Psychiatric diagnoses provided upon initial assessment:  Opioid Use Disorder  Patient's medications adjusted during hospitalization:  COWS with as needed medication Restart methadone at reduced dose of 30 mg daily   Patient's care was discussed during the interdisciplinary team meeting every day during the hospitalization.  Attempted to contact [facility] several times  during hospitalization, including day of discharge. Patient was encouraged to continue outpatient follow up with them.   Patient declined hepatitis and HIV testing. Urine pregnancy and UDS were not collected due to limited pt cooperation.     Total Time spent with patient: 30 minutes  Past Psychiatric History:  Past Dx of depression, anxiety, and opioid use disorder Previous medication trials include suboxone and trazodone.  Past Medical History:  In 2021 the patient was medically admitted with sepsis from IV drug use infection. Social History:  Pt is homeless for the past couple of years. Reports limited social support and that the majority of her family members are deceased. She reports receiving some financial support from a "sugar daddy".  Tobacco Cessation:  Prescription not provided because: due to limited pt participation in evaluation. She declined NRT.  Current Medications:  Current Facility-Administered Medications  Medication Dose Route Frequency Provider Last Rate Last Admin   acetaminophen (TYLENOL) tablet 650 mg  650 mg Oral Q6H PRN Ajibola, Ene A, NP       alum & mag hydroxide-simeth (MAALOX/MYLANTA) 200-200-20 MG/5ML suspension 30 mL  30 mL Oral Q4H PRN Ajibola, Ene A, NP       dicyclomine (BENTYL) tablet 20 mg  20 mg Oral Q6H PRN Ajibola, Ene A, NP   20 mg at 10/30/22 0840   hydrOXYzine (ATARAX) tablet 25 mg  25 mg Oral Q6H PRN Ajibola, Ene A, NP   25 mg at 10/31/22 1109   loperamide (IMODIUM) capsule 2-4 mg  2-4 mg Oral PRN Ajibola, Ene A, NP       magnesium hydroxide (MILK OF MAGNESIA) suspension 30 mL  30 mL Oral Daily PRN Ajibola, Ene A, NP  methadone (DOLOPHINE) tablet 30 mg  30 mg Oral Daily Carlyn Reichert, MD   30 mg at 10/31/22 6578   methocarbamol (ROBAXIN) tablet 500 mg  500 mg Oral Q8H PRN Ajibola, Ene A, NP   500 mg at 10/31/22 0913   naproxen (NAPROSYN) tablet 500 mg  500 mg Oral BID PRN Ajibola, Ene A, NP   500 mg at 10/31/22 0758   ondansetron  (ZOFRAN-ODT) disintegrating tablet 4 mg  4 mg Oral Q6H PRN Ajibola, Ene A, NP       traZODone (DESYREL) tablet 50 mg  50 mg Oral QHS PRN Ajibola, Ene A, NP   50 mg at 10/30/22 2124   Current Outpatient Medications  Medication Sig Dispense Refill   methadone (METHADOSE) 40 MG disintegrating tablet Take 80 mg by mouth every 6 (six) hours as needed for withdrawal.      PTA Medications:  Facility Ordered Medications  Medication   acetaminophen (TYLENOL) tablet 650 mg   alum & mag hydroxide-simeth (MAALOX/MYLANTA) 200-200-20 MG/5ML suspension 30 mL   magnesium hydroxide (MILK OF MAGNESIA) suspension 30 mL   traZODone (DESYREL) tablet 50 mg   dicyclomine (BENTYL) tablet 20 mg   hydrOXYzine (ATARAX) tablet 25 mg   loperamide (IMODIUM) capsule 2-4 mg   methocarbamol (ROBAXIN) tablet 500 mg   naproxen (NAPROSYN) tablet 500 mg   ondansetron (ZOFRAN-ODT) disintegrating tablet 4 mg   methadone (DOLOPHINE) tablet 30 mg       10/31/2022   11:48 AM 10/30/2022   10:43 AM 04/21/2020   10:49 AM  Depression screen PHQ 2/9  Decreased Interest 0 1 0  Down, Depressed, Hopeless 0 1 0  PHQ - 2 Score 0 2 0  Altered sleeping 0 1 0  Tired, decreased energy 0 1 0  Change in appetite 0 1 0  Feeling bad or failure about yourself  0 1 0  Trouble concentrating 0 1 0  Moving slowly or fidgety/restless 0 1 0  Suicidal thoughts 0 0 0  PHQ-9 Score 0 8 0  Difficult doing work/chores Not difficult at all      Flowsheet Row ED from 10/29/2022 in [facility] ED from 10/19/2022 in [facility] ED from 03/24/2022 in [facility] Emergency Department at [facility]  C-SSRS RISK CATEGORY No Risk No Risk No Risk       Musculoskeletal  Strength & Muscle Tone: within normal limits Gait & Station: normal Patient leans: N/A  Psychiatric Specialty Exam  Presentation  General Appearance:  Appropriate for Environment  Eye  Contact: Fair  Speech: Clear and Coherent  Speech Volume: Normal  Handedness: Right   Mood and Affect  Mood: Euthymic  Affect: Congruent   Thought Process  Thought Processes: Coherent; Linear  Descriptions of Associations:Intact  Orientation:Full (Time, Place and Person)  Thought Content:Logical  Diagnosis of Schizophrenia or Schizoaffective disorder in past: No    Hallucinations:Hallucinations: None  Ideas of Reference:None  Suicidal Thoughts:Suicidal Thoughts: No  Homicidal Thoughts:Homicidal Thoughts: No   Sensorium  Memory: Immediate Good; Recent Good; Remote Good  Judgment: Fair  Insight: Fair   Chartered certified accountant: Fair  Attention Span: Fair  Recall: [employer] of Knowledge: Fair  Language: Fair   Psychomotor Activity  Psychomotor Activity: Psychomotor Activity: Normal   Assets  Assets: Communication Skills; Desire for Improvement   Sleep  Sleep: Sleep: Fair   No data recorded  Physical Exam  Physical Exam ROS Blood pressure 124/80, pulse  65, temperature 97.9 F (36.6 C), temperature source Oral, resp. rate 16, SpO2 100%. There is no height or weight on file to calculate BMI.  Demographic Factors:  Caucasian  Loss Factors: Legal issues  Historical Factors: NA  Risk Reduction Factors:   NA  Continued Clinical Symptoms:  Alcohol/Substance Abuse/Dependencies  Cognitive Features That Contribute To Risk:  Closed-mindedness    Suicide Risk:  Mild:  Suicidal ideation of limited frequency, intensity, duration, and specificity.  There are no identifiable plans, no associated intent, mild dysphoria and related symptoms, good self-control (both objective and subjective assessment), few other risk factors, and identifiable protective factors, including available and accessible social support.  Plan Of Care/Follow-up recommendations:  Activity: as tolerated  Diet: heart  healthy  Other: -Follow-up with your outpatient psychiatric provider -instructions on appointment date, time, and address (location) are provided to you in discharge paperwork.  -Take your psychiatric medications as prescribed at discharge - instructions are provided to you in the discharge paperwork  -Recommend abstinence from alcohol, tobacco, and other illicit drug use at discharge.   -If your psychiatric symptoms recur, worsen, or if you have side effects to your psychiatric medications, call your outpatient psychiatric provider, 911, [phone] or go to the nearest emergency department.  -If suicidal thoughts recur, call your outpatient psychiatric provider, 911, [phone] or go to the nearest emergency department.   Lorri Frederick, MD 10/31/2022, 12:37 PM

## 2022-10-31 NOTE — ED Notes (Signed)
Patient reported to the nurses station appearing anxious, stating she is ready to go. Patient recently communicated with the provider. The writer asked her if she expressed desire to be d/c with the provider to which she stated no. The Clinical research associate informed her that the provider is currently in conference with another patient, when completed, writer will inform them of request. The writer also explained to her that she can request to be d/c via a 72 hr release request and explained what it ment.

## 2022-10-31 NOTE — ED Notes (Signed)
Patient is A& x 4, calm, cooperative and irritable r/t c/o back pain 10/10. Naproxen administered per order. Patient denies SI, HI, AVH. Patients mood is congruent.

## 2022-10-31 NOTE — ED Notes (Signed)
Patient appears anxious post group. Patient observed restless with anxious facial expression. Hydroxyzine 25 mg administered.

## 2022-11-01 ENCOUNTER — Ambulatory Visit (HOSPITAL_COMMUNITY)
Admission: EM | Admit: 2022-11-01 | Discharge: 2022-11-01 | Disposition: A | Discharge status: Home / Self Care | Payer: Medicaid Other | Attending: Nurse Practitioner | Admitting: Nurse Practitioner

## 2022-11-01 DIAGNOSIS — F1910 Other psychoactive substance abuse, uncomplicated: Secondary | ICD-10-CM | POA: Insufficient documentation

## 2022-11-01 DIAGNOSIS — F1120 Opioid dependence, uncomplicated: Secondary | ICD-10-CM | POA: Insufficient documentation

## 2022-11-01 DIAGNOSIS — F419 Anxiety disorder, unspecified: Secondary | ICD-10-CM | POA: Insufficient documentation

## 2022-11-01 DIAGNOSIS — F32A Depression, unspecified: Secondary | ICD-10-CM | POA: Insufficient documentation

## 2022-11-01 DIAGNOSIS — Z765 Malingerer [conscious simulation]: Secondary | ICD-10-CM | POA: Insufficient documentation

## 2022-11-01 DIAGNOSIS — Z5901 Sheltered homelessness: Secondary | ICD-10-CM | POA: Insufficient documentation

## 2022-11-01 NOTE — Discharge Instructions (Addendum)
..[facility] [address] [place], [place], [address] [phone] phone  Offers food and emergency or transitional housing to men, women, or families in need. Clients participate in programs and workshops developed to promote self-sufficiency and personal development.Call or walk in. Applications are accepted Monday, Wednesday, and Friday by appointment only. Need photo ID and proof of income.  [facility] [address] [place], [place] [address] [phone] Population served: Adult men & women (75 years old and older, able to perform activities for daily living) Documents required: Valid ID & Social Security Card  [facility] [address] [place], [place]  [address] [phone] Population served: Families with children  [facility] [address] [place], [place]  [address] [phone] Population served: Single women 18+ without dependents Documents required:  Valid ID & Social Security Card  [facility] [address], [place], [place]  [address] [phone] Population served: Female veterans 18+ with substance abuse/mental health issues Eligibility: By referral only  [facility] [address] [place], [place] [address] [phone] Population served: Males 18+ Documents required: Valid ID & Social Security Card  [facility] [employer] [facility] [employer] [employer] [address] [place] [address] [phone] or [phone] Population served: Pregnant women with or without children  Documents required: Valid ID & Social Security [employer] [address], [place], [place] [address] [phone] Population Served: Families with children  [facility] [address] [place], [place] [address] [phone] Population served: Men 18+, preference for disabled and/or  veterans Eligibility: By referral only  [facility] [address] [place], [place], [place] [address] [phone] or [phone] Population served: Families with children.    WOMEN ONLY  The Shelter serves up to 20 women each night. Open from December 11th through the end of March in the evenings from 5:30 pm until 7:30 am, the Shelter provides a hot evening meal, shower and sleeping facilities, and food for the next day. Secure parking is available beside the building.     Shelter Address   Directions [facility] [address] [place], [place]  If you are in need of housing through the shelter, contact the [employer] at [phone].  Discharge recommendations:  Patient is to take medications as prescribed. Please see information for follow-up appointment with psychiatry and therapy. Please follow up with your primary care provider for all medical related needs.   Therapy: We recommend that patient participate in individual therapy to address mental health concerns.  Medications: The patient or guardian is to contact a medical professional and/or outpatient provider to address any new side effects that develop. The patient or guardian should update outpatient providers of any new medications and/or medication changes.   Atypical antipsychotics: If you are prescribed an atypical antipsychotic, it is recommended that your height, weight, BMI, blood pressure, fasting lipid panel, and fasting blood sugar be monitored by your outpatient providers.  Safety:  The patient should abstain from use of illicit substances/drugs and abuse of any medications. If symptoms worsen or do not continue to improve or if the patient becomes actively suicidal or homicidal then it is recommended that the patient return to the closest hospital emergency department, the [facility]  Center, or call 911  for further evaluation and treatment. National Suicide Prevention Lifeline 1-800-SUICIDE or 781-684-8812.  About 988 988 offers 24/7 access to trained crisis counselors who can help people experiencing mental health-related distress. People can call or text 988 or chat 988lifeline.org for themselves or if they are worried about a loved one who may need crisis support.  Crisis Mobile: [facility]:                     [phone] (for crisis response 24 hours a day) [facility] Hotline:                                            9072644584

## 2022-11-01 NOTE — ED Provider Notes (Signed)
Behavioral Health Urgent Care Medical Screening Exam  Patient Name: Cheryl Hogan MRN: [id number] Date of Evaluation: 11/01/22 Chief Complaint:  " I don't have anywhere to go". Diagnosis:  Final diagnoses:  Sheltered homelessness  Malingering  Substance abuse (HCC)    History of Present illness: Cheryl Hogan is a 37 y.o. female. With psychiatric history of ADD, Anxiety, depression, opioid dependence on agonist therapy and polysubstance abuse, who presented voluntarily as a walk in to [facility] seeking detox and complaining of homelessness.  Patient was seen face to face and chart reviewed. Per chart review, Patient  was admitted to the [facility] for substance abuse treatment/detox 10/26- and requested to leave AMA 10/28, citing worsening cravings, no motivation for rehabilitation as reasons.  She also declined offer for residential treatment and does not wish to switch to suboxone in order to be accepted to residential rehabilitation facilities. She is receiving methadone treatment from [facility].    On evaluation, patient is alert, oriented x 4, and cooperative. Speech is clear and coherent. Pt appears casual, Eye contact is good. Mood is euthymic, affect is congruent with mood. Thought process is coherent/goal directed and thought content is WDL. Pt denies SI/HI/AVH. There is no objective indication that the patient is responding to internal stimuli. No delusions elicited during this assessment.    Patient reports " I need to get some meds for mental health, I need detox, I don't have anywhere to sleep, I was dropped off, I need to stay".   Patient reports she did not read the resources in her discharge instruction yesterday because " I don't need to do what it says, I need to stay here, I left yesterday because I'm screwed and needed to hit".   Patient denies current withdrawal symptoms, saying " I don't know how I feel, reporting she last took drugs (heroine and cocaine) yesterday after she left  the [facility].   Discussed recommendation for discharge and follow up with substance abuse intensive outpatient program. Patient will be given resources, in addition to resources for area homeless shelters. Patient is not in agreement, stating " if I leave, where will I sleep tonight? I don't have anywhere to go, I was dropped off, You wont understand, let me stay".  Patient then reports " I need to go now, so I can call my ride to turn around and come get me, because if he gets home, it'll be hard for him to come pick me up tonight". Patient will also be provided with a buss pass if available.   Support, encouragement and reassurance provided about ongoing stressors. Patient is provided with opportunity for questions.   Flowsheet Row ED from 10/29/2022 in [facility] ED from 10/19/2022 in [facility] ED from 03/24/2022 in [facility] Emergency Department at [facility]  C-SSRS RISK CATEGORY No Risk No Risk No Risk       Psychiatric Specialty Exam  Presentation  General Appearance:Casual  Eye Contact:Good  Speech:Clear and Coherent  Speech Volume:Normal  Handedness:Right   Mood and Affect  Mood: Euthymic  Affect: Congruent   Thought Process  Thought Processes: Coherent; Goal Directed  Descriptions of Associations:Intact  Orientation:Full (Time, Place and Person)  Thought Content:WDL  Diagnosis of Schizophrenia or Schizoaffective disorder in past: No   Hallucinations:None  Ideas of Reference:None  Suicidal Thoughts:No  Homicidal Thoughts:No   Sensorium  Memory: Immediate Good  Judgment: Poor  Insight: Poor   Executive Functions  Concentration: Good  Attention Span:  Good  Recall: Good  Fund of Knowledge: Good  Language: Good   Psychomotor Activity  Psychomotor Activity: Normal   Assets  Assets: Communication Skills; Desire for Improvement   Sleep   Sleep: Fair  Number of hours:  1.5   Physical Exam: Physical Exam Constitutional:      General: She is not in acute distress.    Appearance: She is not diaphoretic.  HENT:     Head: Normocephalic.     Right Ear: External ear normal.     Left Ear: External ear normal.     Nose: No congestion.  Eyes:     General:        Right eye: No discharge.        Left eye: No discharge.  Cardiovascular:     Rate and Rhythm: Normal rate.  Pulmonary:     Effort: No respiratory distress.  Chest:     Chest wall: No tenderness.  Neurological:     Mental Status: She is alert and oriented to person, place, and time.  Psychiatric:        Attention and Perception: Attention and perception normal.        Mood and Affect: Mood and affect normal.        Speech: Speech normal.        Behavior: Behavior is cooperative.        Thought Content: Thought content normal.        Cognition and Memory: Cognition and memory normal.    Review of Systems  Constitutional:  Negative for chills, diaphoresis and fever.  HENT:  Negative for congestion.   Eyes:  Negative for discharge.  Respiratory:  Negative for cough, shortness of breath and wheezing.   Cardiovascular:  Negative for chest pain and palpitations.  Gastrointestinal:  Negative for diarrhea, nausea and vomiting.  Neurological:  Negative for dizziness, seizures, loss of consciousness and headaches.  Psychiatric/Behavioral:  Positive for substance abuse.    Blood pressure 112/73, pulse 98, temperature 98.3 F (36.8 C), temperature source Oral, resp. rate 18, SpO2 98%. There is no height or weight on file to calculate BMI.  Musculoskeletal: Strength & Muscle Tone: within normal limits Gait & Station: normal Patient leans: N/A   [facility] MSE Discharge Disposition for Follow up and Recommendations: Based on my evaluation the patient does not appear to have an emergency medical condition and can be discharged with resources and follow up care in  outpatient services for Substance Abuse Intensive Outpatient Program  Recommend discharge and follow up with substance abuse intensive outpatient program. Resources provided. Recommend area homeless shelters.Resources also provided.  Patient was just discharged yesterday from the [facility]. She will do well with outpatient substance abuse treatment programs, as she gets restless inpatient and leaves treatment AMA before completion.   Pt denies SI/HI/AVH or paranoia. Patient does not meet inpatient psychiatric admission criteria or IVC criteria at this time. There is no evidence of imminent risk of harm to self or others.   Discharge recommendations:  Please follow up with your primary care provider for all medical related needs.   Therapy: We recommend that patient participate in individual therapy to address mental health concerns.  Medications: The patient or guardian is to contact a medical professional and/or outpatient provider to address any new side effects that develop. The patient or guardian should update outpatient providers of any new medications and/or medication changes.   Atypical antipsychotics: If you are prescribed an atypical antipsychotic, it is recommended that  your height, weight, BMI, blood pressure, fasting lipid panel, and fasting blood sugar be monitored by your outpatient providers.  Safety:  The patient should abstain from use of illicit substances/drugs and abuse of any medications. If symptoms worsen or do not continue to improve or if the patient becomes actively suicidal or homicidal then it is recommended that the patient return to the closest hospital emergency department, the [facility], or call 911 for further evaluation and treatment. National Suicide Prevention Lifeline 1-800-SUICIDE or 214-796-0896.  About 988 988 offers 24/7 access to trained crisis counselors who can help people experiencing mental health-related distress.  People can call or text 988 or chat 988lifeline.org for themselves or if they are worried about a loved one who may need crisis support.  Crisis Mobile: [facility]:                     [phone] (for crisis response 24 hours a day) [facility] Hotline:                                            289-689-8223   Patient is discharged in stable condition.   Mancel Bale, NP 11/01/2022, 9:58 PM

## 2022-11-01 NOTE — Progress Notes (Signed)
   11/01/22 2106  [facility] Triage Screening (Walk-ins at [facility] only)  How Did You Hear About [place]? Hospital Discharge  What Is the Reason for Your Visit/Call Today? 37 year old patient presents to the [facility] voluntarily and unaccompanied. Pt was seen here at the [facility] on 10/26 and discharged on 10/28. Pt reports that she is currently homeless and uses heroin and cocaine " as much as she can get" daily. Pt reports using a dime sized amount of cocaine and heroin last night via injecting and denies substance use today. Pt reports that she is currently having withdrawal symptoms, including, nausea, sweating, hot flashes and irritability. Pt denied SI/HI/AVH. Pt reports a history of Bipolar d/o, Anxiety, Depression and ADHD. Pt is on Methadone, which she is receiving through [facility].  How Long Has This Been Causing You Problems? > than 6 months  Have You Recently Had Any Thoughts About Hurting Yourself? No  Are You Planning to Commit Suicide/Harm Yourself At This time? No  Have you Recently Had Thoughts About Hurting Someone [name]? No  Are You Planning To Harm Someone At This Time? No  Explanation: Pt denied SI/HI  Are you currently experiencing any auditory, visual or other hallucinations? No  Have You Used Any Alcohol or Drugs in the Past 24 Hours? Yes  How long ago did you use Drugs or Alcohol? Yesterday, Last Night  What Did You Use and How Much? Dime Sized amount of Heroin and Cocaine  Do you have any current medical co-morbidities that require immediate attention? No  Clinician description of patient physical appearance/behavior: Pt is restless and agitated. Pt is disheveled, casually dressed.  What Do You Feel Would Help You the Most Today? Alcohol or Drug Use Treatment  If access to [facility] Ur[facility] was not available, would you have sought care in the Emergency Department? Yes  Determination of Need Routine (7 days)  Options For Referral Intensive Outpatient Therapy;Medication Management

## 2022-11-02 ENCOUNTER — Emergency Department (HOSPITAL_BASED_OUTPATIENT_CLINIC_OR_DEPARTMENT_OTHER)
Admission: EM | Admit: 2022-11-02 | Discharge: 2022-11-03 | Discharge status: Against Medical Advice | Payer: Medicaid Other | Attending: Emergency Medicine | Admitting: Emergency Medicine

## 2022-11-02 ENCOUNTER — Emergency Department (HOSPITAL_BASED_OUTPATIENT_CLINIC_OR_DEPARTMENT_OTHER): Payer: Medicaid Other

## 2022-11-02 ENCOUNTER — Other Ambulatory Visit: Payer: Self-pay

## 2022-11-02 ENCOUNTER — Encounter (HOSPITAL_BASED_OUTPATIENT_CLINIC_OR_DEPARTMENT_OTHER): Payer: Self-pay

## 2022-11-02 DIAGNOSIS — F419 Anxiety disorder, unspecified: Secondary | ICD-10-CM | POA: Insufficient documentation

## 2022-11-02 DIAGNOSIS — F17200 Nicotine dependence, unspecified, uncomplicated: Secondary | ICD-10-CM | POA: Diagnosis not present

## 2022-11-02 DIAGNOSIS — R0789 Other chest pain: Secondary | ICD-10-CM | POA: Diagnosis present

## 2022-11-02 DIAGNOSIS — R451 Restlessness and agitation: Secondary | ICD-10-CM | POA: Insufficient documentation

## 2022-11-02 DIAGNOSIS — Z79899 Other long term (current) drug therapy: Secondary | ICD-10-CM | POA: Diagnosis not present

## 2022-11-02 DIAGNOSIS — R079 Chest pain, unspecified: Secondary | ICD-10-CM

## 2022-11-02 DIAGNOSIS — R0602 Shortness of breath: Secondary | ICD-10-CM | POA: Insufficient documentation

## 2022-11-02 DIAGNOSIS — D72829 Elevated white blood cell count, unspecified: Secondary | ICD-10-CM | POA: Insufficient documentation

## 2022-11-02 LAB — CBC
HCT: 40.4 % (ref 36.0–46.0)
Hemoglobin: 13.4 g/dL (ref 12.0–15.0)
MCH: 27.3 pg (ref 26.0–34.0)
MCHC: 33.2 g/dL (ref 30.0–36.0)
MCV: 82.4 fL (ref 80.0–100.0)
Platelets: 287 10*3/uL (ref 150–400)
RBC: 4.9 MIL/uL (ref 3.87–5.11)
RDW: 15.5 % (ref 11.5–15.5)
WBC: 12.1 10*3/uL — ABNORMAL HIGH (ref 4.0–10.5)
nRBC: 0 % (ref 0.0–0.2)

## 2022-11-02 LAB — RAPID URINE DRUG SCREEN, HOSP PERFORMED
Amphetamines: NOT DETECTED
Barbiturates: NOT DETECTED
Benzodiazepines: NOT DETECTED
Cocaine: POSITIVE — AB
Opiates: NOT DETECTED
Tetrahydrocannabinol: POSITIVE — AB

## 2022-11-02 LAB — BASIC METABOLIC PANEL
Anion gap: 11 (ref 5–15)
BUN: 15 mg/dL (ref 6–20)
CO2: 23 mmol/L (ref 22–32)
Calcium: 8.6 mg/dL — ABNORMAL LOW (ref 8.9–10.3)
Chloride: 99 mmol/L (ref 98–111)
Creatinine, Ser: 0.67 mg/dL (ref 0.44–1.00)
GFR, Estimated: >60 mL/min (ref >60)
Glucose, Bld: 100 mg/dL — ABNORMAL HIGH (ref 70–99)
Potassium: 3.7 mmol/L (ref 3.5–5.1)
Sodium: 133 mmol/L — ABNORMAL LOW (ref 135–145)

## 2022-11-02 LAB — TROPONIN I (HIGH SENSITIVITY): Troponin I (High Sensitivity): 3 ng/L (ref <18)

## 2022-11-02 LAB — PREGNANCY, URINE: Preg Test, Ur: NEGATIVE

## 2022-11-02 NOTE — ED Triage Notes (Signed)
Pt arrived POV for on-going CP that has been intermittent for "months" associated with SOB, pt is a daily smoker. Pt denies n/v/d, pain is also non-radiating. Pt reports"I can feel my heart at times". VSS, A&O x4, NAD Noted, EKG NSR.   Pt reports also wants detox, former drug user that relapse 3 years ago, reports today used heroin (IV) and cocaine. Pt reports is NOT SI/HI

## 2022-11-02 NOTE — ED Notes (Signed)
Extra lab tubes sent to lab if needed

## 2022-11-02 NOTE — ED Provider Notes (Signed)
EMERGENCY DEPARTMENT AT [facility] Provider Note   CSN: [id number] Arrival date & time: 11/02/22  2053     History  Chief Complaint  Patient presents with   Chest Pain    Cheryl Hogan is a 37 y.o. female with past medical history significant for hepatitis, opioid use disorder, depression, reportedly with previous history of pulmonary abscess, endocarditis who presents concern for chest pain intermittent for months to years associate with shortness of breath.  She reports that she also is a daily smoker.  She denies nausea, vomiting, diarrhea.  Reports that she currently is using IV heroin, cocaine, denies SI, HI at this time.  She denies any hallucinations.  She is here with employer, they are both requesting admission for this patient for detox.  Reports that she was admitted to the behavioral health urgent care a few days ago and she left at that point, they report that she is having resistance to being readmitted to the facility since she left AGAINST MEDICAL ADVICE at that time.   Chest Pain      Home Medications Prior to Admission medications   Medication Sig Start Date End Date Taking? Authorizing Provider  methadone (METHADOSE) 40 MG disintegrating tablet Take 80 mg by mouth every 6 (six) hours as needed for withdrawal.    [provider]  traZODone (DESYREL) 50 MG tablet Take 0.5-1 tablets (25-50 mg total) by mouth at bedtime as needed for sleep. 08/10/22 09/28/22  Alfredia Ferguson, PA-C      Allergies    Patient has no known allergies.    Review of Systems   Review of Systems  Cardiovascular:  Positive for chest pain.  All other systems reviewed and are negative.   Physical Exam Updated Vital Signs BP 132/84 (BP Location: Right Arm)   Pulse 93   Temp 98.4 F (36.9 C)   Resp 20   Ht 5\' 9"  (1.753 m)   Wt 90.7 kg   LMP 10/03/2022 (Approximate)   SpO2 98%   BMI 29.53 kg/m  Physical Exam Vitals and nursing note reviewed.   Constitutional:      General: She is not in acute distress.    Appearance: Normal appearance.     Comments: Anxious, agitated  HENT:     Head: Normocephalic and atraumatic.  Eyes:     General:        Right eye: No discharge.        Left eye: No discharge.  Cardiovascular:     Rate and Rhythm: Normal rate and regular rhythm.     Heart sounds: No murmur heard.    No friction rub. No gallop.  Pulmonary:     Effort: Pulmonary effort is normal.     Breath sounds: Normal breath sounds.  Abdominal:     General: Bowel sounds are normal.     Palpations: Abdomen is soft.  Skin:    General: Skin is warm and dry.     Capillary Refill: Capillary refill takes less than 2 seconds.     Comments: Disheveled appearing, but no obvious cellulitis, or open abscess  Neurological:     Mental Status: She is alert and oriented to person, place, and time.  Psychiatric:        Mood and Affect: Mood is anxious.        Behavior: Behavior is agitated.     Comments: Denies SI, HI, AVH     ED Results / Procedures / Treatments   Labs (  all labs ordered are listed, but only abnormal results are displayed) Labs Reviewed  BASIC METABOLIC PANEL - Abnormal; Notable for the following components:      Result Value   Sodium 133 (*)    Glucose, Bld 100 (*)    Calcium 8.6 (*)    All other components within normal limits  CBC - Abnormal; Notable for the following components:   WBC 12.1 (*)    All other components within normal limits  RAPID URINE DRUG SCREEN, HOSP PERFORMED - Abnormal; Notable for the following components:   Cocaine POSITIVE (*)    Tetrahydrocannabinol POSITIVE (*)    All other components within normal limits  CULTURE, BLOOD (ROUTINE X 2)  CULTURE, BLOOD (ROUTINE X 2)  PREGNANCY, URINE  D-DIMER, QUANTITATIVE  LACTIC ACID, PLASMA  LACTIC ACID, PLASMA  TROPONIN I (HIGH SENSITIVITY)  TROPONIN I (HIGH SENSITIVITY)    EKG None  Radiology DG Chest 2 View  Result Date:  11/02/2022 CLINICAL DATA:  Chest pain and pressure. Worsening today but ongoing x1 year. EXAM: CHEST - 2 VIEW COMPARISON:  PA and lateral chest 01/30/2018 FINDINGS: The lungs are mildly hyperaerated. Linear scarring is noted in the prior location of the left upper lobe pneumonic process noted previously. The lungs are clear of active infiltrates. There is slight elevation of the right hemidiaphragm. No pleural effusion. The cardiomediastinal silhouette and vascular pattern are normal. Early degenerative change thoracic spine. IMPRESSION: 1. No evidence of acute chest disease. 2. Mild hyperaeration. 3. Linear scarring in the prior location of the left upper lobe pneumonic process noted previously. Electronically Signed   By: Almira Bar M.D.   On: 11/02/2022 22:17    Procedures Procedures    Medications Ordered in ED Medications - No data to display  ED Course/ Medical Decision Making/ A&P                                 Medical Decision Making Amount and/or Complexity of Data Reviewed Labs: ordered. Radiology: ordered.   This patient is a 37 y.o. female  who presents to the ED for concern of chest pain, shortness of breath for months.   Differential diagnoses prior to evaluation: The emergent differential diagnosis includes, but is not limited to,  ACS, AAS, PE, Mallory-Weiss, Boerhaave's, Pneumonia, acute bronchitis, asthma or COPD exacerbation, anxiety, MSK pain or traumatic injury to the chest, acid reflux versus other --heavy consideration spent on recurrent pulmonary abscess, endocarditis, or other abnormality in context of her current IV drug use. This is not an exhaustive differential.   Past Medical History / Co-morbidities / Social History: hepatitis, opioid use disorder, depression  Additional history: Chart reviewed. Pertinent results include: Reviewed lab work, imaging from previous emergency department visits, recent urgent care evaluation, notably with frequent elopement  from the emergency department  Physical Exam: Physical exam performed. The pertinent findings include: Anxious, agitated, somewhat disheveled, but with stable vital signs, normotensive, no tachycardia, no fever, temperature 98.4.  She has stable oxygen saturation on room air.   Lab Tests/Imaging studies: I personally interpreted labs/imaging and the pertinent results include: BMP notable for hyponatremia, sodium 133.  CBC is notable for leukocytosis, white blood cells 12.1.  Her UDS is positive for cocaine, THC.  Negative for opiates.  Initial troponin is 3, negative pregnancy test.  We discussed and I would recommend a D-dimer, blood cultures, lactic acid, and repeat troponin as well as CT  of the chest, considered PE study, patient left AGAINST MEDICAL ADVICE prior to completing the studies.  I independently interpreted plain film chest x-ray which shows no evidence of acute abnormality, she does have some scarring at left lung base likely from her previous abscess. I agree with the radiologist interpretation.  Cardiac monitoring: EKG obtained and interpreted by myself and attending physician which shows: Normal sinus rhythm, left posterior fascicular block   Prior to completing her treatment I went to assess the patient for the first time, after some of her lab work had resulted.  She is present at bedside with her employer who is requesting detox for patient.  Patient is anxious, agitated, requesting admission for detox.  Discussed with patient that her medical evaluation is still in process, do have reasonable suspicion that she could be developing endocarditis versus recurrent pulmonary abscess although her initial lab work does not suggest already reason for admission given her history, and symptoms getting further evaluation is warranted prior to disposition.  When discussing workup, possible admission, and detox needs, patient and her employer became agitated with description that we do not have a  direct admission detox program without other medical requirements for admission, versus acute psychiatric crisis.  Patient is within her right mind, not under IVC order, no evidence of psychosis, I do not see reason to involuntarily commit her at this time.  Patient immediately became quite agitated, reports that we are not doing "which she needs", and that she will "say anything she needs to to get detox".  Patient stormed out of room prior to completing treatment.  Warned of risk of leaving its medical advice and continues to leave at this time. Final Clinical Impression(s) / ED Diagnoses Final diagnoses:  None    Rx / DC Orders ED Discharge Orders     None         Olene Floss, PA-C 11/02/22 2246    Alvira Monday, MD 11/11/22 1118

## 2022-11-07 ENCOUNTER — Other Ambulatory Visit (HOSPITAL_COMMUNITY): Payer: Self-pay

## 2022-11-07 MED ORDER — NICOTINE 21 MG/24HR TD PT24
21.0000 mg | MEDICATED_PATCH | Freq: Every day | TRANSDERMAL | 0 refills | Status: DC
  Filled 2022-11-07: qty 28, 28d supply, fill #0

## 2022-11-07 MED ORDER — NALOXONE HCL 4 MG/0.1ML NA LIQD
1.0000 | NASAL | 0 refills | Status: DC | PRN
  Filled 2022-11-07: qty 2, 30d supply, fill #0

## 2022-11-16 ENCOUNTER — Other Ambulatory Visit (HOSPITAL_COMMUNITY): Payer: Self-pay

## 2022-12-20 ENCOUNTER — Encounter (HOSPITAL_COMMUNITY): Payer: Self-pay

## 2022-12-20 ENCOUNTER — Other Ambulatory Visit: Payer: Self-pay

## 2022-12-20 ENCOUNTER — Emergency Department (HOSPITAL_COMMUNITY)
Admission: EM | Admit: 2022-12-20 | Discharge: 2022-12-20 | Discharge status: Against Medical Advice | Payer: MEDICAID | Attending: Emergency Medicine | Admitting: Emergency Medicine

## 2022-12-20 DIAGNOSIS — M79672 Pain in left foot: Secondary | ICD-10-CM | POA: Diagnosis present

## 2022-12-20 DIAGNOSIS — Z5321 Procedure and treatment not carried out due to patient leaving prior to being seen by health care provider: Secondary | ICD-10-CM | POA: Diagnosis not present

## 2022-12-20 DIAGNOSIS — M79671 Pain in right foot: Secondary | ICD-10-CM | POA: Diagnosis not present

## 2022-12-20 NOTE — ED Notes (Signed)
 Pt called x3 with no answer, pt taken OTF.

## 2022-12-20 NOTE — ED Triage Notes (Signed)
Patient reports pain in bottom of the feet that shoot up her legs.  Patient is spastic and reports she needs referral to someone who can manager her meds she was given while in detox and  a UDS bc she [place] a [place] [employer] and she wants to check it for herself.  Patient reports she is not here for detox but resources.

## 2023-06-15 ENCOUNTER — Emergency Department (HOSPITAL_COMMUNITY)
Admission: EM | Admit: 2023-06-15 | Discharge: 2023-06-15 | Disposition: A | Discharge status: Home / Self Care | Payer: MEDICAID | Attending: Emergency Medicine | Admitting: Emergency Medicine

## 2023-06-15 ENCOUNTER — Other Ambulatory Visit: Payer: Self-pay

## 2023-06-15 ENCOUNTER — Encounter (HOSPITAL_COMMUNITY): Payer: Self-pay

## 2023-06-15 ENCOUNTER — Emergency Department (HOSPITAL_COMMUNITY): Payer: MEDICAID

## 2023-06-15 DIAGNOSIS — R0789 Other chest pain: Secondary | ICD-10-CM | POA: Diagnosis not present

## 2023-06-15 DIAGNOSIS — R079 Chest pain, unspecified: Secondary | ICD-10-CM

## 2023-06-15 DIAGNOSIS — L089 Local infection of the skin and subcutaneous tissue, unspecified: Secondary | ICD-10-CM

## 2023-06-15 LAB — BASIC METABOLIC PANEL WITH GFR
Anion gap: 14 (ref 5–15)
BUN: 5 mg/dL — ABNORMAL LOW (ref 6–20)
CO2: 25 mmol/L (ref 22–32)
Calcium: 9.5 mg/dL (ref 8.9–10.3)
Chloride: 103 mmol/L (ref 98–111)
Creatinine, Ser: 0.65 mg/dL (ref 0.44–1.00)
GFR, Estimated: >60 mL/min (ref >60)
Glucose, Bld: 112 mg/dL — ABNORMAL HIGH (ref 70–99)
Potassium: 3.7 mmol/L (ref 3.5–5.1)
Sodium: 142 mmol/L (ref 135–145)

## 2023-06-15 LAB — CBC
HCT: 39.3 % (ref 36.0–46.0)
Hemoglobin: 12.7 g/dL (ref 12.0–15.0)
MCH: 28.2 pg (ref 26.0–34.0)
MCHC: 32.3 g/dL (ref 30.0–36.0)
MCV: 87.3 fL (ref 80.0–100.0)
Platelets: 373 10*3/uL (ref 150–400)
RBC: 4.5 MIL/uL (ref 3.87–5.11)
RDW: 13.7 % (ref 11.5–15.5)
WBC: 11.2 10*3/uL — ABNORMAL HIGH (ref 4.0–10.5)
nRBC: 0 % (ref 0.0–0.2)

## 2023-06-15 LAB — D-DIMER, QUANTITATIVE: D-Dimer, Quant: 1.18 ug{FEU}/mL — ABNORMAL HIGH (ref 0.00–0.50)

## 2023-06-15 LAB — BRAIN NATRIURETIC PEPTIDE: B Natriuretic Peptide: 60.6 pg/mL (ref 0.0–100.0)

## 2023-06-15 LAB — TROPONIN I (HIGH SENSITIVITY): Troponin I (High Sensitivity): 5 ng/L (ref <18)

## 2023-06-15 LAB — HEPATIC FUNCTION PANEL
ALT: 16 U/L (ref 0–44)
AST: 19 U/L (ref 15–41)
Albumin: 3.7 g/dL (ref 3.5–5.0)
Alkaline Phosphatase: 58 U/L (ref 38–126)
Bilirubin, Direct: <0.1 mg/dL (ref 0.0–0.2)
Total Bilirubin: 0.5 mg/dL (ref 0.0–1.2)
Total Protein: 7.3 g/dL (ref 6.5–8.1)

## 2023-06-15 LAB — LIPASE, BLOOD: Lipase: 27 U/L (ref 11–51)

## 2023-06-15 LAB — HCG, SERUM, QUALITATIVE: Preg, Serum: NEGATIVE

## 2023-06-15 MED ORDER — DOXYCYCLINE HYCLATE 100 MG PO CAPS
100.0000 mg | ORAL_CAPSULE | Freq: Two times a day (BID) | ORAL | 0 refills | Status: DC
  Filled 2023-06-15: qty 20, 10d supply, fill #0

## 2023-06-15 MED ORDER — IOHEXOL 350 MG/ML SOLN
75.0000 mL | Freq: Once | INTRAVENOUS | Status: AC | PRN
  Administered 2023-06-15: 75 mL via INTRAVENOUS

## 2023-06-15 MED ORDER — ONDANSETRON 4 MG PO TBDP
4.0000 mg | ORAL_TABLET | Freq: Once | ORAL | Status: AC
  Administered 2023-06-15: 4 mg via ORAL
  Filled 2023-06-15: qty 1

## 2023-06-15 MED ORDER — DOXYCYCLINE HYCLATE 100 MG PO TABS
100.0000 mg | ORAL_TABLET | Freq: Once | ORAL | Status: AC
  Administered 2023-06-15: 100 mg via ORAL
  Filled 2023-06-15: qty 1

## 2023-06-15 NOTE — ED Notes (Signed)
 Patient transported to CT

## 2023-06-15 NOTE — ED Notes (Signed)
 Patient transported back from CT

## 2023-06-15 NOTE — ED Notes (Signed)
Pt discharged. Pt given discharge papers and papers explained. Pt in NAD at this time

## 2023-06-15 NOTE — Discharge Instructions (Signed)
 Overall workup today is unremarkable.  Regarding irritation in your right armpit area I have started you on some antibiotics to treat for this.  I given your first dose tonight.  You can pick up your prescription tomorrow and continue.  I suspect that this is likely some hair follicle inflammation and irritation but please return if this becomes more swollen red and painful.  Otherwise is no evidence of a heart attack or other acute process today.

## 2023-06-15 NOTE — ED Triage Notes (Signed)
 Pt is coming in with complaints of chest pain that has been ongoing for awhile now. She has a Hx of endocarditis as well. She mentions the pain is in the center of her chest. It is accompanied with bilateral lower leg swelling. She has a secondary complaint of a rash on her arms and legs that she does not know if it is a rash from poison Ivy.

## 2023-06-15 NOTE — ED Provider Notes (Signed)
 [place] EMERGENCY DEPARTMENT AT [facility] Provider Note   CSN: [id number] Arrival date & time: 06/15/23  1907     Patient presents with: Chest Pain   Cheryl Hogan is a 38 y.o. female.   Patient here with chest discomfort.  States somewhat chronically has chest pain.  History of endocarditis, IV narcotic drug use.  She denies any fever or chills.  No nausea vomiting diarrhea.  Has noticed maybe some swelling to her ankles at times.  Went camping recently.  Has poison ivy type rash on her arms and legs.  Have been there for about a week.  Denies any weakness numbness tingling.  Denies any exertional symptoms.  Denies any cough sputum production shortness of breath.  No abdominal pain nausea vomiting diarrhea.  She also mentions some area of irritation in her right armpit area  The history is provided by the patient.       Prior to Admission medications   Medication Sig Start Date End Date Taking? Authorizing Provider  doxycycline  (VIBRAMYCIN ) 100 MG capsule Take 1 capsule (100 mg total) by mouth 2 (two) times daily. 06/15/23  Yes Khristian Seals, DO  methadone  (METHADOSE ) 40 MG disintegrating tablet Take 80 mg by mouth every 6 (six) hours as needed for withdrawal.    [provider]  naloxone  (NARCAN ) nasal spray 4 mg/0.1 mL Place 1 spray into the nose as needed for overdose. 11/07/22     traZODone  (DESYREL ) 50 MG tablet Take 0.5-1 tablets (25-50 mg total) by mouth at bedtime as needed for sleep. 08/10/22 09/28/22  Trenton Frock, PA-C    Allergies: Patient has no known allergies.    Review of Systems  Updated Vital Signs BP (!) 138/103   Pulse 97   Temp 98.1 F (36.7 C)   Resp 18   SpO2 98%   Physical Exam Vitals and nursing note reviewed.  Constitutional:      General: She is not in acute distress.    Appearance: She is well-developed. She is not ill-appearing.  HENT:     Head: Normocephalic and atraumatic.   Eyes:     Extraocular Movements:  Extraocular movements intact.     Conjunctiva/sclera: Conjunctivae normal.     Pupils: Pupils are equal, round, and reactive to light.    Cardiovascular:     Rate and Rhythm: Normal rate and regular rhythm.     Pulses:          Radial pulses are 2+ on the right side and 2+ on the left side.     Heart sounds: Normal heart sounds. No murmur heard. Pulmonary:     Effort: Pulmonary effort is normal. No respiratory distress.     Breath sounds: Normal breath sounds.  Abdominal:     Palpations: Abdomen is soft.     Tenderness: There is no abdominal tenderness.   Musculoskeletal:        General: No swelling. Normal range of motion.     Cervical back: Normal range of motion and neck supple.   Skin:    General: Skin is warm and dry.     Capillary Refill: Capillary refill takes less than 2 seconds.     Comments: She has a few scattered areas of dry scabs on her bilateral forearms and legs but there is no signs of abscess cellulitis or petechia, these areas are may be pinpoint and nonspecific, may be a little bit of folliculitis in the right armpit but no obvious abscess or major  infectious process.   Neurological:     Mental Status: She is alert.   Psychiatric:        Mood and Affect: Mood normal.     (all labs ordered are listed, but only abnormal results are displayed) Labs Reviewed  BASIC METABOLIC PANEL WITH GFR - Abnormal; Notable for the following components:      Result Value   Glucose, Bld 112 (*)    BUN 5 (*)    All other components within normal limits  CBC - Abnormal; Notable for the following components:   WBC 11.2 (*)    All other components within normal limits  D-DIMER, QUANTITATIVE - Abnormal; Notable for the following components:   D-Dimer, Quant 1.18 (*)    All other components within normal limits  HCG, SERUM, QUALITATIVE  BRAIN NATRIURETIC PEPTIDE  HEPATIC FUNCTION PANEL  LIPASE, BLOOD  TROPONIN I (HIGH SENSITIVITY)    EKG: EKG  Interpretation Date/Time:  Thursday June 15 2023 19:54:45 EDT Ventricular Rate:  78 PR Interval:  181 QRS Duration:  120 QT Interval:  413 QTC Calculation: 471 R Axis:   102  Text Interpretation: Sinus rhythm Consider left atrial enlargement Nonspecific intraventricular conduction delay Confirmed by Lowery Rue [phone]) on 06/15/2023 7:57:12 PM  Radiology: CT Angio Chest PE W and/or Wo Contrast Result Date: 06/15/2023 CLINICAL DATA:  Pulmonary embolism (PE) suspected, low to intermediate prob, positive D-dimer. Chest pain EXAM: CT ANGIOGRAPHY CHEST WITH CONTRAST TECHNIQUE: Multidetector CT imaging of the chest was performed using the standard protocol during bolus administration of intravenous contrast. Multiplanar CT image reconstructions and MIPs were obtained to evaluate the vascular anatomy. RADIATION DOSE REDUCTION: This exam was performed according to the departmental dose-optimization program which includes automated exposure control, adjustment of the mA and/or kV according to patient size and/or use of iterative reconstruction technique. CONTRAST:  75mL OMNIPAQUE IOHEXOL 350 MG/ML SOLN COMPARISON:  01/24/2018 FINDINGS: Cardiovascular: No filling defects in the pulmonary arteries to suggest pulmonary emboli. Heart is normal size. Aorta is normal caliber. Mediastinum/Nodes: No mediastinal, hilar, or axillary adenopathy. Small scattered axillary lymph nodes, none pathologically enlarged. Insert trachea thyroid  unremarkable. Lungs/Pleura: Linear density in the left upper lobe in the area of prior cavitary mass, compatible with scarring. No acute confluent airspace opacities or effusions. Upper Abdomen: No acute findings. Nephrolithiasis in the right kidney. No hydronephrosis. Musculoskeletal: No acute bony abnormality. There is skin thickening in the right axilla with underlying subcutaneous stranding. This may reflect cellulitis. Review of the MIP images confirms the above findings. IMPRESSION:  No evidence of pulmonary embolus. No acute cardiopulmonary disease. Skin thickening and subcutaneous stranding in the right axilla may reflect cellulitis. No focal fluid collection. Electronically Signed   By: Janeece Mechanic M.D.   On: 06/15/2023 22:34   DG Chest 2 View Result Date: 06/15/2023 CLINICAL DATA:  Chest pain EXAM: CHEST - 2 VIEW COMPARISON:  11/02/2022 FINDINGS: Cardiac shadow is within normal limits. The lungs are well aerated bilaterally. No focal infiltrate or effusion is seen. No bony abnormality is noted. IMPRESSION: No active cardiopulmonary disease. Electronically Signed   By: Violeta Grey M.D.   On: 06/15/2023 19:57     Procedures   Medications Ordered in the ED  doxycycline  (VIBRA -TABS) tablet 100 mg (has no administration in time range)  ondansetron  (ZOFRAN -ODT) disintegrating tablet 4 mg (4 mg Oral Given 06/15/23 2007)  iohexol (OMNIPAQUE) 350 MG/ML injection 75 mL (75 mLs Intravenous Contrast Given 06/15/23 2221)  Medical Decision Making Amount and/or Complexity of Data Reviewed Labs: ordered. Radiology: ordered.  Risk Prescription drug management.   Cheryl Hogan is here with chest pain.  History of endocarditis, IV drug use still currently using fentanyl  at times.  Normal vitals.  No fever.  Well-appearing.  Clear breath sounds.  She has nonspecific areas of irritation on her skin.  Small pinpoint scabs here and they are scattered throughout the arms and legs.  These are not concerning per my evaluation.  These do not look like petechiae or splinter hemorrhages or other any other acute process.  These look like dried scabs may be from poison ivy or some other dermatitis.  Ultimately seems less likely that this is ACS PE infectious process but will evaluate for those things with CBC CMP lipase D-dimer troponin chest x-ray.  EKG shows sinus rhythm.  No ischemic changes.  Could be reflux seems less likely to be anemia.  Will reevaluate.   Will give Zofran .  She has no fever.  Reassuring vitals.  Reassuring exam.  No obvious murmur.  Lab work showed no significant leukocytosis anemia or electrolyte abnormality.  Troponin normal.  D-dimer was elevated so CT scan of the chest was obtained.  Otherwise lab work was unremarkable.  CT scan showed no PE.  There may be mild cellulitis area in the right axilla which clinically might fit but overall this looks more like a folliculitis.  There is no abscess or fluid collection.  Conservatively will start her on some antibiotics and have her follow-up with primary care.  Discharged in good condition.  Understands return precautions.  This chart was dictated using voice recognition software.  Despite best efforts to proofread,  errors can occur which can change the documentation meaning.      Final diagnoses:  Nonspecific chest pain  Skin infection    ED Discharge Orders          Ordered    doxycycline  (VIBRAMYCIN ) 100 MG capsule  2 times daily        06/15/23 2238               Lowery Rue, DO 06/15/23 2240

## 2023-06-16 ENCOUNTER — Other Ambulatory Visit (HOSPITAL_COMMUNITY): Payer: Self-pay

## 2023-06-16 ENCOUNTER — Ambulatory Visit (HOSPITAL_COMMUNITY): Admission: EM | Admit: 2023-06-16 | Discharge: 2023-06-16 | Discharge status: Against Medical Advice | Payer: MEDICAID

## 2023-06-16 ENCOUNTER — Ambulatory Visit (HOSPITAL_COMMUNITY)
Admission: EM | Admit: 2023-06-16 | Discharge: 2023-06-17 | Disposition: A | Discharge status: Other Acute Inpatient Hospital | Payer: MEDICAID | Attending: Nurse Practitioner | Admitting: Nurse Practitioner

## 2023-06-16 DIAGNOSIS — F1190 Opioid use, unspecified, uncomplicated: Secondary | ICD-10-CM | POA: Diagnosis not present

## 2023-06-16 DIAGNOSIS — Z8619 Personal history of other infectious and parasitic diseases: Secondary | ICD-10-CM | POA: Insufficient documentation

## 2023-06-16 DIAGNOSIS — F1911 Other psychoactive substance abuse, in remission: Secondary | ICD-10-CM | POA: Insufficient documentation

## 2023-06-16 DIAGNOSIS — F330 Major depressive disorder, recurrent, mild: Secondary | ICD-10-CM | POA: Insufficient documentation

## 2023-06-16 DIAGNOSIS — F411 Generalized anxiety disorder: Secondary | ICD-10-CM | POA: Insufficient documentation

## 2023-06-16 LAB — POCT URINE DRUG SCREEN - MANUAL ENTRY (I-SCREEN)
POC Amphetamine UR: POSITIVE — AB
POC Buprenorphine (BUP): NOT DETECTED
POC Cocaine UR: POSITIVE — AB
POC Marijuana UR: POSITIVE — AB
POC Methadone UR: NOT DETECTED
POC Methamphetamine UR: POSITIVE — AB
POC Morphine: POSITIVE — AB
POC Oxazepam (BZO): NOT DETECTED
POC Oxycodone UR: NOT DETECTED
POC Secobarbital (BAR): NOT DETECTED

## 2023-06-16 LAB — POC URINE PREG, ED: Preg Test, Ur: NEGATIVE

## 2023-06-16 MED ORDER — CLONIDINE HCL 0.1 MG PO TABS
0.1000 mg | ORAL_TABLET | Freq: Four times a day (QID) | ORAL | Status: DC
  Administered 2023-06-17: 0.1 mg via ORAL
  Filled 2023-06-16: qty 1

## 2023-06-16 MED ORDER — MAGNESIUM HYDROXIDE 400 MG/5ML PO SUSP: 30.0000 mL | Freq: Every day | ORAL | Status: DC | PRN

## 2023-06-16 MED ORDER — DIPHENHYDRAMINE HCL 50 MG/ML IJ SOLN: 50.0000 mg | Freq: Three times a day (TID) | INTRAMUSCULAR | Status: DC | PRN

## 2023-06-16 MED ORDER — LORAZEPAM 2 MG/ML IJ SOLN: 2.0000 mg | Freq: Three times a day (TID) | INTRAMUSCULAR | Status: DC | PRN

## 2023-06-16 MED ORDER — HALOPERIDOL LACTATE 5 MG/ML IJ SOLN: 10.0000 mg | Freq: Three times a day (TID) | INTRAMUSCULAR | Status: DC | PRN

## 2023-06-16 MED ORDER — ACETAMINOPHEN 325 MG PO TABS: 650.0000 mg | ORAL_TABLET | Freq: Four times a day (QID) | ORAL | Status: DC | PRN

## 2023-06-16 MED ORDER — NAPROXEN 500 MG PO TABS: 500.0000 mg | ORAL_TABLET | Freq: Two times a day (BID) | ORAL | Status: DC | PRN

## 2023-06-16 MED ORDER — TRAZODONE HCL 50 MG PO TABS: 50.0000 mg | ORAL_TABLET | Freq: Every evening | ORAL | Status: DC | PRN

## 2023-06-16 MED ORDER — HALOPERIDOL LACTATE 5 MG/ML IJ SOLN: 5.0000 mg | Freq: Three times a day (TID) | INTRAMUSCULAR | Status: DC | PRN

## 2023-06-16 MED ORDER — HALOPERIDOL 5 MG PO TABS: 5.0000 mg | ORAL_TABLET | Freq: Three times a day (TID) | ORAL | Status: DC | PRN

## 2023-06-16 MED ORDER — CLONIDINE HCL 0.1 MG PO TABS: 0.1000 mg | ORAL_TABLET | ORAL | Status: DC

## 2023-06-16 MED ORDER — DICYCLOMINE HCL 20 MG PO TABS: 20.0000 mg | ORAL_TABLET | Freq: Four times a day (QID) | ORAL | Status: DC | PRN

## 2023-06-16 MED ORDER — HYDROXYZINE HCL 25 MG PO TABS
25.0000 mg | ORAL_TABLET | Freq: Four times a day (QID) | ORAL | Status: DC | PRN
  Administered 2023-06-17: 25 mg via ORAL
  Filled 2023-06-16: qty 1

## 2023-06-16 MED ORDER — METHOCARBAMOL 500 MG PO TABS
500.0000 mg | ORAL_TABLET | Freq: Three times a day (TID) | ORAL | Status: DC | PRN
  Administered 2023-06-17: 500 mg via ORAL
  Filled 2023-06-16: qty 1

## 2023-06-16 MED ORDER — LOPERAMIDE HCL 2 MG PO CAPS: 2.0000 mg | ORAL_CAPSULE | ORAL | Status: DC | PRN

## 2023-06-16 MED ORDER — ONDANSETRON 4 MG PO TBDP: 4.0000 mg | ORAL_TABLET | Freq: Four times a day (QID) | ORAL | Status: DC | PRN

## 2023-06-16 MED ORDER — DIPHENHYDRAMINE HCL 50 MG PO CAPS: 50.0000 mg | ORAL_CAPSULE | Freq: Three times a day (TID) | ORAL | Status: DC | PRN

## 2023-06-16 MED ORDER — CLONIDINE HCL 0.1 MG PO TABS: 0.1000 mg | ORAL_TABLET | Freq: Every day | ORAL | Status: DC

## 2023-06-16 MED ORDER — ALUM & MAG HYDROXIDE-SIMETH 200-200-20 MG/5ML PO SUSP: 30.0000 mL | ORAL | Status: DC | PRN
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
## 2023-06-16 NOTE — ED Provider Notes (Signed)
 [facility] Continuous Assessment Admission H&P  Date: 06/17/23 Patient Name: Cheryl Hogan MRN: [id number] Chief Complaint-wants to detox  Diagnoses:  Final diagnoses:  Opioid use disorder    HPI: Cheryl Hogan is a 38 y/o female with a history of opioid dependence, anxiety, add and depression presenting to [facility] as a walk in unaccompanied with complaints of worsening substance abuse use and requesting detox.  Cheryl Hogan, 39 y.o., female patient seen face to face by this provider and chart reviewed on 06/17/23.  On evaluation Cheryl Hogan reports that she has been using drugs since she was 38 years old. Patient reports that she grew up in foster care after her mother died. Patient reports that she uses a gram of heroin intravenously and crack cocaine daily. Patient report she has been going to [facility] for methadone . Patient states that she wants to get off drugs, because she feels it is affecting her health. Patient endorses poor sleep, poor appetite, anhedonia, anxiety and depression symptoms. Patient presented earlier in the evening and was triaged by staff, but left without being seen. Patient later returned and stated that she left to get something to eat, and that she felt dope sick. Patient's UDS is positive for amphetamine, cocaine, methamphetamine, morphine  and marijuana.    During evaluation Cheryl Hogan is sitting in the assessment in no acute distress.  She is alert, oriented x 4, calm, cooperative and attentive. Her mood is depressed with congruent affect. She has normal speech, and behavior.  Objectively there is no evidence of psychosis/mania or delusional thinking.  Patient is able to converse coherently, goal directed thoughts, no distractibility, or pre-occupation.  She also denies suicidal/self-harm/homicidal ideation, psychosis, and paranoia.  Patient answered question appropriately.     Total Time spent with patient: 20 minutes  Musculoskeletal  Strength & Muscle  Tone: within normal limits Gait & Station: normal Patient leans: Backward  Psychiatric Specialty Exam  Presentation General Appearance:  Disheveled  Eye Contact: Good  Speech: Clear and Coherent  Speech Volume: Normal  Handedness: Right   Mood and Affect  Mood: Depressed  Affect: Congruent   Thought Process  Thought Processes: Coherent  Descriptions of Associations:Intact  Orientation:Full (Time, Place and Person)  Thought Content:WDL  Diagnosis of Schizophrenia or Schizoaffective disorder in past: No   Hallucinations:Hallucinations: None  Ideas of Reference:None  Suicidal Thoughts:Suicidal Thoughts: No  Homicidal Thoughts:Homicidal Thoughts: No   Sensorium  Memory: Immediate Fair; Recent Fair; Remote Fair  Judgment: Poor  Insight: Fair   Chartered certified accountant: Fair  Attention Span: Fair  Recall: [employer] of Knowledge: Fair  Language: Fair   Psychomotor Activity  Psychomotor Activity: Psychomotor Activity: Normal   Assets  Assets: Communication Skills; Desire for Improvement; Physical Health   Sleep  Sleep: Sleep: Poor Number of Hours of Sleep: 1   Nutritional Assessment (For OBS and FBC admissions only) Has the patient had a weight loss or gain of 10 pounds or more in the last 3 months?: No Has the patient had a decrease in food intake/or appetite?: No Does the patient have dental problems?: No Does the patient have eating habits or behaviors that may be indicators of an eating disorder including binging or inducing vomiting?: No Has the patient recently lost weight without trying?: 0 Has the patient been eating poorly because of a decreased appetite?: 0 Malnutrition Screening Tool Score: 0    Physical Exam HENT:     Head: Normocephalic.     Nose: Nose normal.  Eyes:     Pupils: Pupils are equal, round, and reactive to light.    Cardiovascular:     Rate and Rhythm: Normal rate.   Pulmonary:     Effort: Pulmonary effort is normal.  Abdominal:     General: Abdomen is flat.   Musculoskeletal:        General: Normal range of motion.     Cervical back: Normal range of motion.   Skin:    General: Skin is warm.   Neurological:     Mental Status: She is alert and oriented to person, place, and time.   Psychiatric:        Attention and Perception: Attention normal.        Mood and Affect: Mood is depressed.        Speech: Speech normal.        Behavior: Behavior is cooperative.        Thought Content: Thought content normal.        Cognition and Memory: Cognition normal.        Judgment: Judgment is impulsive.   Review of Systems  Constitutional: Negative.   HENT: Negative.    Eyes: Negative.   Respiratory: Negative.    Cardiovascular: Negative.   Gastrointestinal: Negative.   Genitourinary: Negative.   Musculoskeletal: Negative.   Skin: Negative.   Neurological: Negative.   Psychiatric/Behavioral:  Positive for substance abuse. The patient is nervous/anxious.     Blood pressure 105/64, pulse 80, temperature 98.3 F (36.8 C), temperature source Oral, resp. rate 20, SpO2 100%. There is no height or weight on file to calculate BMI.  Past Psychiatric History: Anxiety, Depression, ADD, opioid dependence,   Is the patient at risk to self? No  Has the patient been a risk to self in the past 6 months? No .    Has the patient been a risk to self within the distant past? No   Is the patient a risk to others? No   Has the patient been a risk to others in the past 6 months? No   Has the patient been a risk to others within the distant past? No   Past Medical History: psoriasis, cellulitis, endocarditis, abscess of right hand, pulmonary abscess  Family History: Denies family history  Social History: Patient is single, unemployed and is homeless  Last Labs:  Admission on 06/16/2023  Component Date Value Ref Range Status   Sodium 06/16/2023 142  135 -  145 mmol/L Final   Potassium 06/16/2023 3.3 (L)  3.5 - 5.1 mmol/L Final   Chloride 06/16/2023 106  98 - 111 mmol/L Final   CO2 06/16/2023 21 (L)  22 - 32 mmol/L Final   Glucose, Bld 06/16/2023 93  70 - 99 mg/dL Final   Glucose reference range applies only to samples taken after fasting for at least 8 hours.   BUN 06/16/2023 8  6 - 20 mg/dL Final   Creatinine, Ser 06/16/2023 0.67  0.44 - 1.00 mg/dL Final   Calcium 16/10/9602 9.5  8.9 - 10.3 mg/dL Final   Total Protein 54/09/8117 7.0  6.5 - 8.1 g/dL Final   Albumin 14/78/2956 3.6  3.5 - 5.0 g/dL Final   AST 21/30/8657 21  15 - 41 U/L Final   ALT 06/16/2023 14  0 - 44 U/L Final   Alkaline Phosphatase 06/16/2023 63  38 - 126 U/L Final   Total Bilirubin 06/16/2023 0.3  0.0 - 1.2 mg/dL Final   GFR, Estimated 06/16/2023 >60  >  60 mL/min Final   Comment: (NOTE) Calculated using the CKD-EPI Creatinine Equation (2021)    Anion gap 06/16/2023 15  5 - 15 Final   Performed at [facility], [address] [place], [place] [address]   Alcohol, Ethyl (B) 06/16/2023 <15  <15 mg/dL Final   Comment: (NOTE) For medical purposes only. Performed at [facility], [address] [place], [place] [address]    Cholesterol 06/16/2023 171  0 - 200 mg/dL Final   Triglycerides 95/28/4132 56  <150 mg/dL Final   HDL 44/01/270 56  >40 mg/dL Final   Total CHOL/HDL Ratio 06/16/2023 3.1  RATIO Final   VLDL 06/16/2023 11  0 - 40 mg/dL Final   LDL Cholesterol 06/16/2023 104 (H)  0 - 99 mg/dL Final   Comment:        Total Cholesterol/HDL:CHD Risk Coronary Heart Disease Risk Table                     Men   Women  1/2 Average Risk   3.4   3.3  Average Risk       5.0   4.4  2 X Average Risk   9.6   7.1  3 X Average Risk  23.4   11.0        Use the calculated Patient Ratio above and the CHD Risk Table to determine the patient's CHD Risk.        ATP III CLASSIFICATION (LDL):  <100     mg/dL   Optimal  536-644  mg/dL   Near or Above                     Optimal  130-159  mg/dL   Borderline  034-742  mg/dL   High  >595     mg/dL   Very High Performed at [facility], [address] [place], [place] [address]    POC Amphetamine UR 06/16/2023 Positive (A)  NONE DETECTED (Cut Off Level 1000 ng/mL) Final   POC Secobarbital (BAR) 06/16/2023 None Detected  NONE DETECTED (Cut Off Level 300 ng/mL) Final   POC Buprenorphine  (BUP) 06/16/2023 None Detected  NONE DETECTED (Cut Off Level 10 ng/mL) Final   POC Oxazepam (BZO) 06/16/2023 None Detected  NONE DETECTED (Cut Off Level 300 ng/mL) Final   POC Cocaine UR 06/16/2023 Positive (A)  NONE DETECTED (Cut Off Level 300 ng/mL) Final   POC Methamphetamine UR 06/16/2023 Positive (A)  NONE DETECTED (Cut Off Level 1000 ng/mL) Final   POC Morphine  06/16/2023 Positive (A)  NONE DETECTED (Cut Off Level 300 ng/mL) Final   POC Methadone  UR 06/16/2023 None Detected  NONE DETECTED (Cut Off Level 300 ng/mL) Final   POC Oxycodone  UR 06/16/2023 None Detected  NONE DETECTED (Cut Off Level 100 ng/mL) Final   POC Marijuana UR 06/16/2023 Positive (A)  NONE DETECTED (Cut Off Level 50 ng/mL) Final   Preg Test, Ur 06/16/2023 Negative  Negative Final  Admission on 06/15/2023, Discharged on 06/15/2023  Component Date Value Ref Range Status   Sodium 06/15/2023 142  135 - 145 mmol/L Final   Potassium 06/15/2023 3.7  3.5 - 5.1 mmol/L Final   Chloride 06/15/2023 103  98 - 111 mmol/L Final   CO2 06/15/2023 25  22 - 32 mmol/L Final   Glucose, Bld 06/15/2023 112 (H)  70 - 99 mg/dL Final   Glucose reference range applies only to samples taken after fasting for  at least 8 hours.   BUN 06/15/2023 5 (L)  6 - 20 mg/dL Final   Creatinine, Ser 06/15/2023 0.65  0.44 - 1.00 mg/dL Final   Calcium 01/05/7251 9.5  8.9 - 10.3 mg/dL Final   GFR, Estimated 06/15/2023 >60  >60 mL/min Final   Comment: (NOTE) Calculated using the CKD-EPI Creatinine Equation (2021)    Anion gap 06/15/2023 14  5 - 15 Final   Performed at [facility], [address] [place], [place] [address]   WBC 06/15/2023 11.2 (H)  4.0 - 10.5 K/uL Final   RBC 06/15/2023 4.50  3.87 - 5.11 MIL/uL Final   Hemoglobin 06/15/2023 12.7  12.0 - 15.0 g/dL Final   HCT 34/74/2595 39.3  36.0 - 46.0 % Final   MCV 06/15/2023 87.3  80.0 - 100.0 fL Final   MCH 06/15/2023 28.2  26.0 - 34.0 pg Final   MCHC 06/15/2023 32.3  30.0 - 36.0 g/dL Final   RDW 63/87/5643 13.7  11.5 - 15.5 % Final   Platelets 06/15/2023 373  150 - 400 K/uL Final   nRBC 06/15/2023 0.0  0.0 - 0.2 % Final   Performed at [facility], [address] [place], [place] [address]   Troponin I (High Sensitivity) 06/15/2023 5  <18 ng/L Final   Comment: (NOTE) Elevated high sensitivity troponin I (hsTnI) values and significant  changes across serial measurements may suggest ACS but many other  chronic and acute conditions are known to elevate hsTnI results.  Refer to the Links section for chest pain algorithms and additional  guidance. Performed at [facility], [address], [place], [place] [address]    Preg, Serum 06/15/2023 NEGATIVE  NEGATIVE Final   Comment:        THE SENSITIVITY OF THIS METHODOLOGY IS >10 mIU/mL. Performed at [facility], [address] [place], [place] [address]    B Natriuretic Peptide 06/15/2023 60.6  0.0 - 100.0 pg/mL Final   Performed at [facility], [address] [place], [place] [address]   Total Protein 06/15/2023 7.3  6.5 - 8.1 g/dL Final   Albumin 93/23/5573 3.7  3.5 - 5.0 g/dL Final   AST 22/02/5425 19  15 - 41 U/L Final   ALT 06/15/2023 16  0 - 44 U/L Final   Alkaline Phosphatase 06/15/2023 58  38 - 126 U/L Final   Total Bilirubin 06/15/2023 0.5  0.0 - 1.2 mg/dL Final   Bilirubin, Direct 06/15/2023 <0.1  0.0 - 0.2 mg/dL Final   Indirect Bilirubin 06/15/2023 NOT CALCULATED  0.3 - 0.9 mg/dL Final   Performed at [facility], [address], [place], [place] [address]   D-Dimer, Quant 06/15/2023 1.18 (H)  0.00  - 0.50 ug/mL-FEU Final   Comment: (NOTE) At the manufacturer cut-off value of 0.5 g/mL FEU, this assay has a negative predictive value of 95-100%.This assay is intended for use in conjunction with a clinical pretest probability (PTP) assessment model to exclude pulmonary embolism (PE) and deep venous thrombosis (DVT) in outpatients suspected of PE or DVT. Results should be correlated with clinical presentation. Performed at [facility], [address], [place], [place] [address]    Lipase 06/15/2023 27  11 - 51 U/L Final   Performed at [facility], [address] [place], [place] [address]    Allergies: Patient has no known allergies.  Medications:  Facility Ordered Medications  Medication  acetaminophen  (TYLENOL ) tablet 650 mg   alum & mag hydroxide-simeth (MAALOX/MYLANTA) 200-200-20 MG/5ML suspension 30 mL   magnesium  hydroxide (MILK OF MAGNESIA) suspension 30 mL   haloperidol (HALDOL) tablet 5 mg   And   diphenhydrAMINE (BENADRYL) capsule 50 mg   haloperidol lactate (HALDOL) injection 5 mg   And   diphenhydrAMINE (BENADRYL) injection 50 mg   And   LORazepam  (ATIVAN ) injection 2 mg   haloperidol lactate (HALDOL) injection 10 mg   And   diphenhydrAMINE (BENADRYL) injection 50 mg   And   LORazepam  (ATIVAN ) injection 2 mg   traZODone  (DESYREL ) tablet 50 mg   dicyclomine  (BENTYL ) tablet 20 mg   hydrOXYzine  (ATARAX ) tablet 25 mg   loperamide  (IMODIUM ) capsule 2-4 mg   methocarbamol  (ROBAXIN ) tablet 500 mg   naproxen  (NAPROSYN ) tablet 500 mg   ondansetron  (ZOFRAN -ODT) disintegrating tablet 4 mg   cloNIDine (CATAPRES) tablet 0.1 mg   Followed by   Cecily Cohen ON 06/19/2023] cloNIDine (CATAPRES) tablet 0.1 mg   Followed by   Cecily Cohen ON 06/22/2023] cloNIDine (CATAPRES) tablet 0.1 mg   PTA Medications  Medication Sig   methadone  (METHADOSE ) 40 MG disintegrating tablet Take 80 mg by mouth every 6 (six) hours as needed for withdrawal.   naloxone  (NARCAN ) nasal spray  4 mg/0.1 mL Place 1 spray into the nose as needed for overdose.   doxycycline  (VIBRAMYCIN ) 100 MG capsule Take 1 capsule (100 mg total) by mouth 2 (two) times daily.    Screenings    Flowsheet Row Most Recent Value  COWS Total Score 4    Medical Decision Making  Cheryl Hogan is a 38 y/o female presenting to [facility] as a walk in unaccompanied with complaints of worsening substance abuse use and requesting detox.    Recommendations  Based on my evaluation the patient does not appear to have an emergency medical condition. Patient will be admitted to [facility] continuous observation for crisis management, safety and stabilization.   Cheryl Mccord E Sydnie Sigmund, NP 06/17/23  4:32 AM
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
## 2023-06-16 NOTE — ED Notes (Signed)
 Pt triaged by TTS Aureliano Blow.  LWBS.

## 2023-06-17 ENCOUNTER — Other Ambulatory Visit (HOSPITAL_COMMUNITY)
Admission: EM | Admit: 2023-06-17 | Discharge: 2023-06-18 | Disposition: A | Discharge status: Home / Self Care | Payer: MEDICAID | Attending: Psychiatry | Admitting: Psychiatry

## 2023-06-17 ENCOUNTER — Other Ambulatory Visit: Payer: Self-pay

## 2023-06-17 DIAGNOSIS — F1510 Other stimulant abuse, uncomplicated: Secondary | ICD-10-CM | POA: Insufficient documentation

## 2023-06-17 DIAGNOSIS — F1120 Opioid dependence, uncomplicated: Secondary | ICD-10-CM | POA: Insufficient documentation

## 2023-06-17 DIAGNOSIS — F1210 Cannabis abuse, uncomplicated: Secondary | ICD-10-CM | POA: Insufficient documentation

## 2023-06-17 DIAGNOSIS — Z8679 Personal history of other diseases of the circulatory system: Secondary | ICD-10-CM | POA: Insufficient documentation

## 2023-06-17 DIAGNOSIS — F330 Major depressive disorder, recurrent, mild: Secondary | ICD-10-CM | POA: Diagnosis not present

## 2023-06-17 DIAGNOSIS — F411 Generalized anxiety disorder: Secondary | ICD-10-CM | POA: Insufficient documentation

## 2023-06-17 DIAGNOSIS — F1190 Opioid use, unspecified, uncomplicated: Secondary | ICD-10-CM | POA: Diagnosis not present

## 2023-06-17 DIAGNOSIS — Z560 Unemployment, unspecified: Secondary | ICD-10-CM | POA: Insufficient documentation

## 2023-06-17 DIAGNOSIS — Z8619 Personal history of other infectious and parasitic diseases: Secondary | ICD-10-CM | POA: Insufficient documentation

## 2023-06-17 DIAGNOSIS — F32A Depression, unspecified: Secondary | ICD-10-CM | POA: Insufficient documentation

## 2023-06-17 DIAGNOSIS — Z91411 Personal history of adult psychological abuse: Secondary | ICD-10-CM | POA: Insufficient documentation

## 2023-06-17 DIAGNOSIS — F1911 Other psychoactive substance abuse, in remission: Secondary | ICD-10-CM | POA: Diagnosis not present

## 2023-06-17 DIAGNOSIS — F1590 Other stimulant use, unspecified, uncomplicated: Secondary | ICD-10-CM | POA: Insufficient documentation

## 2023-06-17 DIAGNOSIS — Z91410 Personal history of adult physical and sexual abuse: Secondary | ICD-10-CM | POA: Insufficient documentation

## 2023-06-17 DIAGNOSIS — F17200 Nicotine dependence, unspecified, uncomplicated: Secondary | ICD-10-CM | POA: Insufficient documentation

## 2023-06-17 LAB — COMPREHENSIVE METABOLIC PANEL WITH GFR
ALT: 14 U/L (ref 0–44)
AST: 21 U/L (ref 15–41)
Albumin: 3.6 g/dL (ref 3.5–5.0)
Alkaline Phosphatase: 63 U/L (ref 38–126)
Anion gap: 15 (ref 5–15)
BUN: 8 mg/dL (ref 6–20)
CO2: 21 mmol/L — ABNORMAL LOW (ref 22–32)
Calcium: 9.5 mg/dL (ref 8.9–10.3)
Chloride: 106 mmol/L (ref 98–111)
Creatinine, Ser: 0.67 mg/dL (ref 0.44–1.00)
GFR, Estimated: >60 mL/min (ref >60)
Glucose, Bld: 93 mg/dL (ref 70–99)
Potassium: 3.3 mmol/L — ABNORMAL LOW (ref 3.5–5.1)
Sodium: 142 mmol/L (ref 135–145)
Total Bilirubin: 0.3 mg/dL (ref 0.0–1.2)
Total Protein: 7 g/dL (ref 6.5–8.1)

## 2023-06-17 LAB — ETHANOL: Alcohol, Ethyl (B): <15 mg/dL (ref <15)

## 2023-06-17 LAB — LIPID PANEL
Cholesterol: 171 mg/dL (ref 0–200)
HDL: 56 mg/dL (ref >40)
LDL Cholesterol: 104 mg/dL — ABNORMAL HIGH (ref 0–99)
Total CHOL/HDL Ratio: 3.1 ratio
Triglycerides: 56 mg/dL (ref <150)
VLDL: 11 mg/dL (ref 0–40)

## 2023-06-17 MED ORDER — HALOPERIDOL 5 MG PO TABS: 5.0000 mg | ORAL_TABLET | Freq: Three times a day (TID) | ORAL | Status: DC | PRN

## 2023-06-17 MED ORDER — DIPHENHYDRAMINE HCL 50 MG/ML IJ SOLN: 50.0000 mg | Freq: Three times a day (TID) | INTRAMUSCULAR | Status: DC | PRN

## 2023-06-17 MED ORDER — ALUM & MAG HYDROXIDE-SIMETH 200-200-20 MG/5ML PO SUSP: 30.0000 mL | ORAL | Status: DC | PRN

## 2023-06-17 MED ORDER — MAGNESIUM HYDROXIDE 400 MG/5ML PO SUSP: 30.0000 mL | Freq: Every day | ORAL | Status: DC | PRN

## 2023-06-17 MED ORDER — LORAZEPAM 2 MG/ML IJ SOLN: 2.0000 mg | Freq: Three times a day (TID) | INTRAMUSCULAR | Status: DC | PRN

## 2023-06-17 MED ORDER — ONDANSETRON 4 MG PO TBDP
4.0000 mg | ORAL_TABLET | Freq: Four times a day (QID) | ORAL | Status: DC | PRN
  Administered 2023-06-17: 4 mg via ORAL
  Filled 2023-06-17: qty 1

## 2023-06-17 MED ORDER — DOXYCYCLINE HYCLATE 100 MG PO CAPS: 100.0000 mg | ORAL_CAPSULE | Freq: Two times a day (BID) | ORAL | Status: DC

## 2023-06-17 MED ORDER — HYDROXYZINE HCL 25 MG PO TABS
25.0000 mg | ORAL_TABLET | Freq: Three times a day (TID) | ORAL | Status: DC | PRN
  Administered 2023-06-18: 25 mg via ORAL
  Filled 2023-06-17 (×3): qty 1

## 2023-06-17 MED ORDER — CLONIDINE HCL 0.1 MG PO TABS: 0.1000 mg | ORAL_TABLET | ORAL | Status: DC

## 2023-06-17 MED ORDER — POTASSIUM CHLORIDE CRYS ER 20 MEQ PO TBCR
20.0000 meq | EXTENDED_RELEASE_TABLET | Freq: Once | ORAL | Status: AC
  Administered 2023-06-17: 20 meq via ORAL
  Filled 2023-06-17: qty 1

## 2023-06-17 MED ORDER — DOXYCYCLINE HYCLATE 100 MG PO TABS
100.0000 mg | ORAL_TABLET | Freq: Two times a day (BID) | ORAL | Status: DC
  Filled 2023-06-17: qty 1

## 2023-06-17 MED ORDER — HALOPERIDOL LACTATE 5 MG/ML IJ SOLN: 5.0000 mg | Freq: Three times a day (TID) | INTRAMUSCULAR | Status: DC | PRN

## 2023-06-17 MED ORDER — CLONIDINE HCL 0.1 MG PO TABS: 0.1000 mg | ORAL_TABLET | Freq: Every day | ORAL | Status: DC

## 2023-06-17 MED ORDER — CLONIDINE HCL 0.1 MG PO TABS
0.1000 mg | ORAL_TABLET | Freq: Four times a day (QID) | ORAL | Status: DC
  Administered 2023-06-18: 0.1 mg via ORAL
  Filled 2023-06-17 (×3): qty 1

## 2023-06-17 MED ORDER — DICYCLOMINE HCL 20 MG PO TABS
20.0000 mg | ORAL_TABLET | Freq: Four times a day (QID) | ORAL | Status: DC | PRN
  Filled 2023-06-17: qty 1

## 2023-06-17 MED ORDER — ACETAMINOPHEN 325 MG PO TABS
650.0000 mg | ORAL_TABLET | Freq: Four times a day (QID) | ORAL | Status: DC | PRN
  Administered 2023-06-17: 650 mg via ORAL
  Filled 2023-06-17: qty 2

## 2023-06-17 MED ORDER — DIPHENHYDRAMINE HCL 50 MG PO CAPS: 50.0000 mg | ORAL_CAPSULE | Freq: Three times a day (TID) | ORAL | Status: DC | PRN

## 2023-06-17 MED ORDER — TRAZODONE HCL 50 MG PO TABS
50.0000 mg | ORAL_TABLET | Freq: Every evening | ORAL | Status: DC | PRN
  Filled 2023-06-17: qty 1

## 2023-06-17 MED ORDER — METHOCARBAMOL 500 MG PO TABS
500.0000 mg | ORAL_TABLET | Freq: Three times a day (TID) | ORAL | Status: DC | PRN
  Filled 2023-06-17: qty 1

## 2023-06-17 MED ORDER — NAPROXEN 500 MG PO TABS
500.0000 mg | ORAL_TABLET | Freq: Two times a day (BID) | ORAL | Status: DC | PRN
  Administered 2023-06-18: 500 mg via ORAL
  Filled 2023-06-17: qty 1

## 2023-06-17 MED ORDER — LOPERAMIDE HCL 2 MG PO CAPS: 2.0000 mg | ORAL_CAPSULE | ORAL | Status: DC | PRN

## 2023-06-17 MED ORDER — HALOPERIDOL LACTATE 5 MG/ML IJ SOLN: 10.0000 mg | Freq: Three times a day (TID) | INTRAMUSCULAR | Status: DC | PRN

## 2023-06-17 NOTE — ED Notes (Signed)
 Patient was given tylenol ,robaxin ,bentyl  and zofran  for symptoms of opiate withdrawal.  Patient also given gatorade and encouraged to increase PO fluids.  Patient complaining of body aches and sweating, nausea stomach cramping.  Patient then got up and ate a small amount of lunch.  She is now on the phone.  Cows=8.  Will monitor and treat symptoms as warranted.

## 2023-06-17 NOTE — Group Note (Signed)
 Group Topic: Overcoming Obstacles  Group Date: 06/17/2023 Start Time: 1630 End Time: 1700 Facilitators: Esther Hem, NT  Department: [facility]  Number of Participants: 9  Group Focus: relapse prevention and self-esteem Treatment Modality:  Psychoeducation Interventions utilized were support Purpose: increase insight and relapse prevention strategies  Name: Cheryl Hogan Date of Birth: 1985/09/22  MR: [id number]    Level of Participation: PT just arrived on unit, did not participate, rested Quality of Participation: None Interactions with others: n/a Mood/Affect: Tired/sleepy Triggers (if applicable): n/a Cognition: n/a Progress: Other Response: N/a Plan: follow-up needed and patient will be encouraged to attend group once feeling better (physically)  Patients Problems:  Patient Active Problem List   Diagnosis Date Noted   Opioid use disorder 06/17/2023   Substance use disorder 10/30/2022   Tobacco use 08/10/2022   Multiple substance abuse (HCC) 08/10/2022   Other fatigue 08/10/2022   Depression, major, 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

## 2023-06-17 NOTE — ED Notes (Signed)
 Patient is in the bedroom sleeping with her eyes closed. NAD   Respirations are even and unlabored. Safety checks in place per policy. Will monitor for safety.

## 2023-06-17 NOTE — ED Provider Notes (Signed)
 FBC/OBS ASAP Discharge Summary  Date and Time: 06/17/2023 10:27 AM  Name: Cheryl Hogan  MRN:  [id number]   Discharge Diagnoses:  Final diagnoses:  Opioid use disorder  Mild episode of recurrent major depressive disorder [facility])    Subjective: Cheryl Hogan 38 y.o., female patient presented to [facility] as a voluntary walk in with complaints of wanting to detox from heroin and depressive symptoms.Cheryl Hogan, is seen face to face by this provider, consulted with Dr. Genita Keys; and chart reviewed on 06/17/23.  Per chart review patient has a past psychiatric history of polysubstance abuse, MDD, GAD and ADD.  Medical history pertinent for current folliculitis/dermatitis to her right axillary that was assessed at [facility] ED 2 days ago and history of psoriasis, hepatitis C and cellulitis of hands.  No current outpatient mental health services.  On evaluation Cheryl Hogan reports that she came into detox from heroin and that she feels dope sick .  Patient reports that she last used yesterday.  She endorses using heroin and crack cocaine every day in the amount of 1 gram.  Patient reports that she started using substances around the age of 51 and has used multiple different drugs.  She is requesting to be started on methadone  and previously stated that she is going to [facility], however pharmacy was able to contact [facility] to stated that patient was not currently a part of the methadone  program.  Her urine drug screen was also negative for methadone  but positive for amphetamines, cocaine, methamphetamines, morphine  and marijuana.  Per chart patient was previously on methadone  while in [place] in 2024.  She reports that she has also been treated with Suboxone  and states either one would be fine to get me through this withdrawal .  Reports depressive symptoms including poor sleep, poor appetite, anhedonia, irritability and sadness.  Patient reports current withdrawal symptoms including body  aches, tremor, irritability, anxiety and overall malaise.  Patient is given as needed medications by nursing staff for withdrawal symptoms.  Discussed the recommendation for patient to receive treatment at the facility based crisis unit and she is agreeable to this plan.  During evaluation Cheryl Hogan is lying down in bed, in no acute distress. She is alert & oriented x 4,  cooperative and attentive for this assessment.  Her mood is anxious and irritable with congruent affect.  She has normal speech, and behavior.  Objectively there is no evidence of psychosis/mania or delusional thinking. Pt does not appear to be responding to internal or external stimuli.  Patient is able to converse coherently, goal directed thoughts, no distractibility, or pre-occupation.  She denies current suicidal/self-harm/homicidal ideation, psychosis, and paranoia.  Patient answered assessment questions appropriately.    Stay Summary: 06/16/23  Cheryl Alpers, NP    Cheryl Hogan, 38 y.o., female patient seen face to face by this provider and chart reviewed on 06/17/23.  On evaluation Cheryl Hogan reports that she has been using drugs since she was 38 years old. Patient reports that she grew up in foster care after her mother died. Patient reports that she uses a gram of heroin intravenously and crack cocaine daily. Patient report she has been going to [facility] for methadone . Patient states that she wants to get off drugs, because she feels it is affecting her health. Patient endorses poor sleep, poor appetite, anhedonia, anxiety and depression symptoms. Patient presented earlier in the evening and was triaged by staff, but left without being seen. Patient later returned and stated that she  left to get something to eat, and that she felt dope sick. Patient's UDS is positive for amphetamine, cocaine, methamphetamine, morphine  and marijuana.    During evaluation Cheryl Hogan is sitting in the assessment in no acute distress.  She  is alert, oriented x 4, calm, cooperative and attentive. Her mood is depressed with congruent affect. She has normal speech, and behavior.  Objectively there is no evidence of psychosis/mania or delusional thinking.  Patient is able to converse coherently, goal directed thoughts, no distractibility, or pre-occupation.  She also denies suicidal/self-harm/homicidal ideation, psychosis, and paranoia.  Patient answered question appropriately.     Total Time spent with patient: 20 minutes  Past Psychiatric History: GAD, MDD, ADD and polysubstance use Past Medical History:  Past Medical History:  Diagnosis Date   Abscess of right hand 01/24/2018   ADD (attention deficit disorder)    Anxiety    Cellulitis of left hand    Depression    Injection of illicit drug within last 12 months    Opioid dependence on agonist therapy (HCC)    Psoriasis    Pulmonary abscess (HCC) 01/24/2018    Family History: none reported Family Psychiatric History: none reported Social History: Pt is currently homeless and unemployed. UDS positive for THC, cocaine, amphetamines, morphine , and methamphetamine. Tobacco Cessation:  N/A, patient does not currently use tobacco products  Current Medications:  Current Facility-Administered Medications  Medication Dose Route Frequency Provider Last Rate Last Admin   acetaminophen  (TYLENOL ) tablet 650 mg  650 mg Oral Q6H PRN Bobbitt, Shalon E, NP       alum & mag hydroxide-simeth (MAALOX/MYLANTA) 200-200-20 MG/5ML suspension 30 mL  30 mL Oral Q4H PRN Bobbitt, Shalon E, NP       cloNIDine (CATAPRES) tablet 0.1 mg  0.1 mg Oral QID Bobbitt, Shalon E, NP   0.1 mg at 06/17/23 9147   Followed by   Cheryl Hogan ON 06/19/2023] cloNIDine (CATAPRES) tablet 0.1 mg  0.1 mg Oral BH-qamhs Bobbitt, Shalon E, NP       Followed by   Cheryl Hogan ON 06/22/2023] cloNIDine (CATAPRES) tablet 0.1 mg  0.1 mg Oral QAC breakfast Bobbitt, Shalon E, NP       dicyclomine  (BENTYL ) tablet 20 mg  20 mg Oral Q6H PRN Bobbitt,  Shalon E, NP       haloperidol (HALDOL) tablet 5 mg  5 mg Oral TID PRN Bobbitt, Shalon E, NP       And   diphenhydrAMINE (BENADRYL) capsule 50 mg  50 mg Oral TID PRN Bobbitt, Shalon E, NP       haloperidol lactate (HALDOL) injection 5 mg  5 mg Intramuscular TID PRN Bobbitt, Shalon E, NP       And   diphenhydrAMINE (BENADRYL) injection 50 mg  50 mg Intramuscular TID PRN Bobbitt, Shalon E, NP       And   LORazepam  (ATIVAN ) injection 2 mg  2 mg Intramuscular TID PRN Bobbitt, Shalon E, NP       haloperidol lactate (HALDOL) injection 10 mg  10 mg Intramuscular TID PRN Bobbitt, Shalon E, NP       And   diphenhydrAMINE (BENADRYL) injection 50 mg  50 mg Intramuscular TID PRN Bobbitt, Shalon E, NP       And   LORazepam  (ATIVAN ) injection 2 mg  2 mg Intramuscular TID PRN Bobbitt, Shalon E, NP       doxycycline  (VIBRAMYCIN ) capsule 100 mg  100 mg Oral BID Cheryl Hogan C, NP  hydrOXYzine  (ATARAX ) tablet 25 mg  25 mg Oral Q6H PRN Bobbitt, Shalon E, NP   25 mg at 06/17/23 0833   loperamide  (IMODIUM ) capsule 2-4 mg  2-4 mg Oral PRN Bobbitt, Shalon E, NP       magnesium  hydroxide (MILK OF MAGNESIA) suspension 30 mL  30 mL Oral Daily PRN Bobbitt, Shalon E, NP       methocarbamol  (ROBAXIN ) tablet 500 mg  500 mg Oral Q8H PRN Bobbitt, Shalon E, NP   500 mg at 06/17/23 1610   naproxen  (NAPROSYN ) tablet 500 mg  500 mg Oral BID PRN Bobbitt, Shalon E, NP       ondansetron  (ZOFRAN -ODT) disintegrating tablet 4 mg  4 mg Oral Q6H PRN Bobbitt, Shalon E, NP       traZODone  (DESYREL ) tablet 50 mg  50 mg Oral QHS PRN Bobbitt, Shalon E, NP       Current Outpatient Medications  Medication Sig Dispense Refill   doxycycline  (VIBRAMYCIN ) 100 MG capsule Take 1 capsule (100 mg total) by mouth 2 (two) times daily. 20 capsule 0   naloxone  (NARCAN ) nasal spray 4 mg/0.1 mL Place 1 spray into the nose as needed for overdose. 2 each 0    PTA Medications:  Facility Ordered Medications  Medication   acetaminophen  (TYLENOL )  tablet 650 mg   alum & mag hydroxide-simeth (MAALOX/MYLANTA) 200-200-20 MG/5ML suspension 30 mL   magnesium  hydroxide (MILK OF MAGNESIA) suspension 30 mL   haloperidol (HALDOL) tablet 5 mg   And   diphenhydrAMINE (BENADRYL) capsule 50 mg   haloperidol lactate (HALDOL) injection 5 mg   And   diphenhydrAMINE (BENADRYL) injection 50 mg   And   LORazepam  (ATIVAN ) injection 2 mg   haloperidol lactate (HALDOL) injection 10 mg   And   diphenhydrAMINE (BENADRYL) injection 50 mg   And   LORazepam  (ATIVAN ) injection 2 mg   traZODone  (DESYREL ) tablet 50 mg   dicyclomine  (BENTYL ) tablet 20 mg   hydrOXYzine  (ATARAX ) tablet 25 mg   loperamide  (IMODIUM ) capsule 2-4 mg   methocarbamol  (ROBAXIN ) tablet 500 mg   naproxen  (NAPROSYN ) tablet 500 mg   ondansetron  (ZOFRAN -ODT) disintegrating tablet 4 mg   cloNIDine (CATAPRES) tablet 0.1 mg   Followed by   Cheryl Hogan ON 06/19/2023] cloNIDine (CATAPRES) tablet 0.1 mg   Followed by   Cheryl Hogan ON 06/22/2023] cloNIDine (CATAPRES) tablet 0.1 mg   [COMPLETED] potassium chloride  SA (KLOR-CON  M) CR tablet 20 mEq   doxycycline  (VIBRAMYCIN ) capsule 100 mg   PTA Medications  Medication Sig   naloxone  (NARCAN ) nasal spray 4 mg/0.1 mL Place 1 spray into the nose as needed for overdose.   doxycycline  (VIBRAMYCIN ) 100 MG capsule Take 1 capsule (100 mg total) by mouth 2 (two) times daily.       10/31/2022   11:48 AM 10/30/2022   10:43 AM 04/21/2020   10:49 AM  Depression screen PHQ 2/9  Decreased Interest 0 1 0  Down, Depressed, Hopeless 0 1 0  PHQ - 2 Score 0 2 0  Altered sleeping 0 1 0  Tired, decreased energy 0 1 0  Change in appetite 0 1 0  Feeling bad or failure about yourself  0 1 0  Trouble concentrating 0 1 0  Moving slowly or fidgety/restless 0 1 0  Suicidal thoughts 0 0 0  PHQ-9 Score 0 8 0  Difficult doing work/chores Not difficult at all      Flowsheet Row ED from 06/16/2023 in [facility] Most  recent reading at  06/17/2023 12:42 AM ED from 06/16/2023 in [facility] Most recent reading at 06/16/2023  7:58 PM ED from 06/15/2023 in [facility] Emergency Department at [facility] Most recent reading at 06/15/2023  7:20 PM  C-SSRS RISK CATEGORY No Risk No Risk No Risk    Musculoskeletal  Strength & Muscle Tone: within normal limits Gait & Station: normal Patient leans: N/A  Psychiatric Specialty Exam  Presentation  General Appearance:  Disheveled  Eye Contact: Fair  Speech: Clear and Coherent  Speech Volume: Normal  Handedness: Right   Mood and Affect  Mood: Dysphoric; Anxious  Affect: Congruent   Thought Process  Thought Processes: Coherent  Descriptions of Associations:Intact  Orientation:Full (Time, Place and Person)  Thought Content:WDL  Diagnosis of Schizophrenia or Schizoaffective disorder in past: No    Hallucinations:Hallucinations: None  Ideas of Reference:None  Suicidal Thoughts:Suicidal Thoughts: No  Homicidal Thoughts:Homicidal Thoughts: No   Sensorium  Memory: Immediate Fair; Recent Fair  Judgment: Poor  Insight: Fair   Chartered certified accountant: Fair  Attention Span: Fair  Recall: [employer] of Knowledge: Fair  Language: Fair   Psychomotor Activity  Psychomotor Activity: Psychomotor Activity: Restlessness   Assets  Assets: Communication Skills; Desire for Improvement; Financial Resources/Insurance; Resilience   Sleep  Sleep: Sleep: Poor  Estimated Sleeping Duration (Last 24 Hours): 0.00 hours  Nutritional Assessment (For OBS and FBC admissions only) Has the patient had a weight loss or gain of 10 pounds or more in the last 3 months?: No Has the patient had a decrease in food intake/or appetite?: No Does the patient have dental problems?: No Does the patient have eating habits or behaviors that may be indicators of an eating disorder including binging or inducing  vomiting?: No Has the patient recently lost weight without trying?: 0 Has the patient been eating poorly because of a decreased appetite?: 0 Malnutrition Screening Tool Score: 0    Physical Exam  Physical Exam Vitals and nursing note reviewed.  Constitutional:      Appearance: She is diaphoretic.  HENT:     Head: Normocephalic.     Nose: Rhinorrhea present.   Eyes:     Extraocular Movements: Extraocular movements intact.    Cardiovascular:     Rate and Rhythm: Normal rate.  Pulmonary:     Effort: Pulmonary effort is normal.   Musculoskeletal:        General: Normal range of motion.     Cervical back: Normal range of motion.   Skin:    Comments: Multiple small scabs on arms and legs from IV drug use.  Folliculitis to right axilla- per ED note    Neurological:     General: No focal deficit present.     Mental Status: She is alert and oriented to person, place, and time.    Review of Systems  Constitutional:  Positive for diaphoresis and malaise/fatigue.  HENT: Negative.    Eyes: Negative.   Respiratory: Negative.    Cardiovascular: Negative.   Gastrointestinal:  Positive for nausea.  Genitourinary: Negative.   Musculoskeletal:  Positive for myalgias.  Skin:        Folliculitis/dermatitis to right axilla.   Neurological: Negative.   Endo/Heme/Allergies: Negative.   Psychiatric/Behavioral:  Positive for substance abuse. The patient is nervous/anxious.    Blood pressure 122/70, pulse 63, temperature 98.3 F (36.8 C), temperature source Oral, resp. rate 18, SpO2 100%. There is no height or weight on file to  calculate BMI.   Plan Of Care/Follow-up recommendations:  Pt is recommended for admission to the Facility Based Crisis Unit for detox monitoring, depressive symptoms and further substance abuse treatment. Pt is currently voluntary and agreeable to this plan.   Vital signs stable  Labs reviewed: Potassium level low at 3.3 -Ordered Potassium 20 mEq once   -Potassium level redraw ordered for 1800  Pt does have folliculitis to her right axilla that was assessed at [facility] ED and prophylactic prescribed Doxycycline  100 mg BID on 06/15/23 -Continued Doxycycline  100 mg BID    Disposition: Pt transferred to [facility].   Davia Erps, NP 06/17/2023, 10:27 AM

## 2023-06-17 NOTE — ED Notes (Signed)
 Patient is in the bedroom calm and sleeping with eyes closed. NAD. Will continue to monitor for safety.

## 2023-06-17 NOTE — ED Notes (Addendum)
 Patient A&Ox4. Denies intent/thoughts to harm self/others when asked. Denies A/VH. Patient reports generalized body aches, tremors, and sweating when asked. No distress noted. Support and encouragement provided. Routine safety checks conducted according to facility protocol. Encouraged patient to notify staff if thoughts of harm toward self or others arise. Endorses safety. Patient verbalized understanding and agreement. Plan of care ongoing, no further concerns as of present. Patient expresses no other needs at this time.

## 2023-06-17 NOTE — Group Note (Signed)
 Group Topic: Relaxation  Group Date: 06/17/2023 Start Time: 2030 End Time: 2045 Facilitators: Marcha Session  Department: [facility]  Number of Participants: 8  Group Focus: art therapy, check in, and clarity of thought Treatment Modality:  Art Therapy and Cognitive Behavioral Therapy Interventions utilized were group exercise and leisure development Purpose: increase insight and foster various positive outcomes, including enhanced communication, increased morale, improved teamwork, and development of problem solving essential skills.  Name: Cheryl Hogan Date of Birth: 09-17-1985  MR: [id number]    Level of Participation: PT DID NOT ATTEND GROUP Quality of Participation: attentive and cooperative Interactions with others: gave feedback Mood/Affect: appropriate Triggers (if applicable): N/A Cognition: concrete Progress: Gaining insight Response: N/A Plan: patient will be encouraged to attend group with peers.  Patients Problems:  Patient Active Problem List   Diagnosis Date Noted   Opioid use disorder 06/17/2023   Substance use disorder 10/30/2022   Tobacco use 08/10/2022   Multiple substance abuse (HCC) 08/10/2022   Other fatigue 08/10/2022   Depression, major, 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

## 2023-06-17 NOTE — Progress Notes (Signed)
 Pharmacy: Called [facility] 06/17/23 07:08 am Pt is NOT a current patient with [facility] for methadone   [phone]   Saunders Curio

## 2023-06-17 NOTE — ED Notes (Signed)
 Patient resting with eyes closed. Respirations even and unlabored. No distress noted. Environment secured. Plan of care ongoing, no further concerns as of present.

## 2023-06-17 NOTE — ED Notes (Signed)
 Admission Note:  38 yr old Caucasian female who presents voluntary for substance use disorder. Pt was in no acute distress except for itching and she relates it to poison ivy. Pt appears flat but calm and cooperative with admission process. Pt denies SI and contracts for safety upon admission. Pt denies AVH . Pt reports an extensive use of Fentanyl , Xylazine and meth use intervenous.  Skin was assessed and found with track marks on her UE bilateral, red rash on ULE bilateral with open areas from scratching and red rash to her right armpit and stated it is staph. 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 PT searched and no contraband found, POC and unit policies explained and understanding verbalized. Consents obtained. Food and fluids offered, and accepted. Pt had no additional questions or concerns.

## 2023-06-17 NOTE — Group Note (Signed)
 Group Topic: Social Support  Group Date: 06/17/2023 Start Time: 1400 End Time: 1500 Facilitators: Arlan Belling, RN  Department: [facility]  Number of Participants: 9  Group Focus: social skills Treatment Modality:  Leisure Development Interventions utilized were clarification, exploration, and group exercise Purpose: regain self-worth and reinforce self-care  Name: Tenishia Ekman Date of Birth: 03-16-1985  MR: [id number]    Level of Participation: did not participate Quality of Participation:  Interactions with others:  Mood/Affect:  Triggers (if applicable):  Cognition:  Progress:  Response:  Plan:   Patients Problems:  Patient Active Problem List   Diagnosis Date Noted   Opioid use disorder 06/17/2023   Substance use disorder 10/30/2022   Tobacco use 08/10/2022   Multiple substance abuse (HCC) 08/10/2022   Other fatigue 08/10/2022   Depression, major, 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

## 2023-06-17 NOTE — ED Notes (Signed)
Pt is resting quietly, appears to be sleeping
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
## 2023-06-17 NOTE — Discharge Instructions (Signed)
Pt transferred to [facility]. 

## 2023-06-18 DIAGNOSIS — F1210 Cannabis abuse, uncomplicated: Secondary | ICD-10-CM | POA: Diagnosis not present

## 2023-06-18 DIAGNOSIS — F1510 Other stimulant abuse, uncomplicated: Secondary | ICD-10-CM | POA: Diagnosis not present

## 2023-06-18 DIAGNOSIS — F1120 Opioid dependence, uncomplicated: Secondary | ICD-10-CM | POA: Diagnosis not present

## 2023-06-18 DIAGNOSIS — F411 Generalized anxiety disorder: Secondary | ICD-10-CM | POA: Diagnosis not present

## 2023-06-18 LAB — POTASSIUM: Potassium: 3.4 mmol/L — ABNORMAL LOW (ref 3.5–5.1)

## 2023-06-18 MED ORDER — POTASSIUM CHLORIDE CRYS ER 20 MEQ PO TBCR
40.0000 meq | EXTENDED_RELEASE_TABLET | Freq: Once | ORAL | Status: AC
  Administered 2023-06-18: 40 meq via ORAL
  Filled 2023-06-18: qty 2

## 2023-06-18 NOTE — ED Notes (Signed)
 Patient left unit AMA. After Visit Summary (AVS) printed and given to patient. AVS reviewed with patient, patient unreceptive to AVS education. Patient A& O x4 and ambulatory. Patient denied SI/HI, A/VH upon discharge. Patient mood irritable. Patient belongings returned to patient from locker #26 complete and intact. Patient escorted to lobby via staff for transport to destination. Safety maintained.

## 2023-06-18 NOTE — Discharge Instructions (Addendum)
 The patient and/or responsible adult understands that the facility has offered any or all of the following: o To examine the patient to determine whether or not an emergency medical condition is present. o To provide stabilizing medical treatment for an emergency condition. o To provide a medically appropriate transfer to another healthcare facility. o To administer ongoing medical care.  The hospital personnel and/or physician have informed the patient of the benefits that might reasonably be expected from the offered services, the risks of refusing the offered services, and the risks of the offered services.  The patient and/or responsible adult appears to have the capacity to understand the risks and benefits as stated. Alternative treatment options have also been discussed with the patient.  The patient and/or responsible adult refuses the offered services against medical advice and the refusal may result in a worsening condition including chronic disability or death.  The patient is welcome to return at any time and chooses to refuse the offered services.
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
## 2023-06-18 NOTE — ED Notes (Signed)
 Patient calm and sleeping comfortably. Will continue to monitor for safety.

## 2023-06-18 NOTE — ED Notes (Signed)
 Patient alert & oriented x4. Denies intent to harm self or others when asked. Denies A/VH. Patient reports generalized aches and pains, stating she has dope sickness. Patient appears to be in active withdrawal based on appearance, patient does not want to stay for treatment at this time, provider Augusta Blizzard, MD aware. Support and encouragement provided. Routine safety checks conducted per facility protocol. Encouraged patient to notify staff if any thoughts of harm towards self or others arise. Patient verbalizes understanding and agreement.

## 2023-06-18 NOTE — ED Notes (Signed)
 Patient resting with eyes closed in no apparent acute distress. Respirations even and unlabored. Environment secured. Safety checks in place according to facility policy.

## 2023-06-19 LAB — POCT PREGNANCY, URINE: Preg Test, Ur: NEGATIVE

## 2023-06-23 ENCOUNTER — Ambulatory Visit (HOSPITAL_COMMUNITY): Payer: MEDICAID | Admitting: Mental Health

## 2023-06-23 ENCOUNTER — Other Ambulatory Visit (HOSPITAL_COMMUNITY): Payer: Self-pay

## 2023-06-23 DIAGNOSIS — F1120 Opioid dependence, uncomplicated: Secondary | ICD-10-CM | POA: Diagnosis not present

## 2023-06-23 DIAGNOSIS — F4310 Post-traumatic stress disorder, unspecified: Secondary | ICD-10-CM | POA: Insufficient documentation
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
## 2023-06-27 ENCOUNTER — Telehealth (HOSPITAL_COMMUNITY): Payer: Self-pay

## 2023-06-27 NOTE — Telephone Encounter (Signed)
 Therapist outreached pt via telephone at Cheryl Hogan, [facility] referred her to this dually  licensed clinician.  [name] answered and therapist confirmed her identity by obtaining two verifiers.  Therapists inquires how she is doing, noting she left detox AMA and inquired if she had gotten back on her MAT. She says that she had and she goes to [facility] for that.  She says she is not sure why she went for detox rather than going to [facility] to get back on her Methadone .  Therapist asks if she wants to schedule for therapy to dual diagnosis assistance and she says she does not. She says she wants medication for anxiety.  She plans to come to walk in clinic tomorrow morning to see a prescriber. [name] then says she sees a Veterinary surgeon outside of [facility] but did not reveal who that is.  Darice Simpler, MS, LMFT, LCAS

## 2023-08-10 ENCOUNTER — Emergency Department (HOSPITAL_COMMUNITY)
Admission: EM | Admit: 2023-08-10 | Discharge: 2023-08-10 | Disposition: A | Discharge status: Home / Self Care | Payer: MEDICAID | Attending: Emergency Medicine | Admitting: Emergency Medicine

## 2023-08-10 ENCOUNTER — Other Ambulatory Visit (HOSPITAL_COMMUNITY): Payer: Self-pay

## 2023-08-10 ENCOUNTER — Emergency Department (HOSPITAL_COMMUNITY): Payer: MEDICAID

## 2023-08-10 ENCOUNTER — Other Ambulatory Visit: Payer: Self-pay

## 2023-08-10 ENCOUNTER — Encounter (HOSPITAL_COMMUNITY): Payer: Self-pay

## 2023-08-10 DIAGNOSIS — L97218 Non-pressure chronic ulcer of right calf with other specified severity: Secondary | ICD-10-CM | POA: Insufficient documentation

## 2023-08-10 DIAGNOSIS — L03115 Cellulitis of right lower limb: Secondary | ICD-10-CM | POA: Diagnosis present

## 2023-08-10 DIAGNOSIS — L02619 Cutaneous abscess of unspecified foot: Secondary | ICD-10-CM

## 2023-08-10 DIAGNOSIS — L02611 Cutaneous abscess of right foot: Secondary | ICD-10-CM | POA: Insufficient documentation

## 2023-08-10 DIAGNOSIS — L732 Hidradenitis suppurativa: Secondary | ICD-10-CM | POA: Diagnosis not present

## 2023-08-10 MED ORDER — LACTATED RINGERS IV BOLUS: 1000.0000 mL | Freq: Once | INTRAVENOUS | Status: DC

## 2023-08-10 MED ORDER — SULFAMETHOXAZOLE-TRIMETHOPRIM 800-160 MG PO TABS
1.0000 | ORAL_TABLET | Freq: Two times a day (BID) | ORAL | 0 refills | Status: AC
  Filled 2023-08-10: qty 14, 7d supply, fill #0

## 2023-08-10 NOTE — ED Provider Notes (Signed)
 [facility] Provider Note  MDM   HPI/ROS:  Cheryl Hogan is a 38 y.o. female with a medical history as below who presents with concern for a foot wound, leg wound and staph in her armpit.  She states that these involving going on for some time as she has been working in the yard and sustained multiple injuries is concerned she may have poison ivy.  She states the wounds are painful but denies any fevers, chills, nausea or vomiting  Physical exam is notable for: - Obvious cellulitis with multiple nidus for infection of the left foot.  No obvious palpable fluctuance or abscess.  Erythematous ulceration to the medial aspect of the right calf with overlying granulation tissue.  No obvious abscess.  Hidradenitis of the right armpit  On my initial evaluation, patient is:  -Vital signs stable. Patient afebrile, hemodynamically stable, and non-toxic appearing. -Additional history obtained from chart review   Differentials include cellulitis, abscess, osteomyelitis, sepsis.    On physical exam patient has multiple areas of cellulitic infections most prominently on her dorsal aspect of her left foot and ulcerative cellulitis on the medial aspect of her right lower leg.  There is exquisite tenderness but does not appear to be out of proportion with exam.  There is no palpable crepitus.  She is afebrile but mildly tachycardic.  She does not appear hyperactive on my exam which raises my concern for possible intoxication given this patient's history.  She is otherwise well alert and oriented and does not appear altered.  Her armpit most consistent with hidradenitis suppurativa.  There is actually no significant surrounding erythema.  Will defer I&D given location within the axilla to avoid scarring.  As documented below patient declined IV and labs.  She does appear to have multiple areas of cellulitis with obvious nidus of infection.  We discussed wound care and the  need to follow-up in wound care clinic referral was placed.  Patient was discharged on antibiotics with strict return precautions and close follow-up outpatient.  Interpretations, interventions, and the patient's course of care are documented below.    Clinical Course as of 08/10/23 1540  Thu Aug 10, 2023  9044 Patient declined IV, labs.  Went to bedside to discuss risk and benefits of this further workup given the significance of her right lower extremity wound.  Patient continues to defer IV access. [RC]    Clinical Course User Index [RC] Sharyne Darina RAMAN, MD      Disposition:  I discussed the plan for discharge with the patient and/or their surrogate at bedside prior to discharge and they were in agreement with the plan and verbalized understanding of the return precautions provided. All questions answered to the best of my ability. Ultimately, the patient was discharged in stable condition with stable vital signs. I am reassured that they are capable of close follow up and good social support at home.   Clinical Impression:  1. Cellulitis and abscess of foot   2. Cellulitis of right lower extremity     Rx / DC Orders ED Discharge Orders          Ordered    sulfamethoxazole -trimethoprim  (BACTRIM  DS) 800-160 MG tablet  2 times daily        08/10/23 1032    AMB referral to wound care center        08/10/23 1032            The plan for this patient  was discussed with Dr. Freddi, who voiced agreement and who oversaw evaluation and treatment of this patient.   Clinical Complexity A medically appropriate history, review of systems, and physical exam was performed.  My independent interpretations of EKG, labs, and radiology are documented in the ED course above.   If decision rules were used in this patient's evaluation, they are listed below.   Click here for ABCD2, HEART and other calculatorsREFRESH Note before signing   Patient's presentation is most consistent with  acute presentation with potential threat to life or bodily function.  Medical Decision Making Amount and/or Complexity of Data Reviewed Radiology: ordered.  Risk Prescription drug management.    HPI/ROS      See MDM section for pertinent HPI and ROS. A complete ROS was performed with pertinent positives/negatives noted above.   Past Medical History:  Diagnosis Date   Abscess of right hand 01/24/2018   ADD (attention deficit disorder)    Anxiety    Cellulitis of left hand    Depression    Injection of illicit drug within last 12 months    Opioid dependence on agonist therapy (HCC)    Psoriasis    Pulmonary abscess (HCC) 01/24/2018    Past Surgical History:  Procedure Laterality Date   I & D EXTREMITY Bilateral 01/24/2018   Procedure: IRRIGATION AND DEBRIDEMENT OF HAND;  Surgeon: Shari Easter, MD;  Location: [facility] OR;  Service: Orthopedics;  Laterality: Bilateral;   TEE WITHOUT CARDIOVERSION N/A 01/29/2018   Procedure: TRANSESOPHAGEAL ECHOCARDIOGRAM (TEE);  Surgeon: Francyne Headland, MD;  Location: [facility] ENDOSCOPY;  Service: Cardiovascular;  Laterality: N/A;      Physical Exam   Vitals:   08/10/23 0854 08/10/23 0854 08/10/23 1037  BP:  99/71 117/69  Pulse:  (!) 106 100  Resp:  17 16  Temp:  98 F (36.7 C)   TempSrc:  Oral   SpO2:  95% 96%  Weight: 68 kg    Height: 5' 9 (1.753 m)      Physical Exam Vitals and nursing note reviewed.  Constitutional:      General: She is not in acute distress.    Appearance: She is well-developed.  HENT:     Head: Normocephalic and atraumatic.  Eyes:     Conjunctiva/sclera: Conjunctivae normal.  Cardiovascular:     Rate and Rhythm: Regular rhythm. Tachycardia present.     Heart sounds: No murmur heard. Pulmonary:     Effort: Pulmonary effort is normal. No respiratory distress.     Breath sounds: Normal breath sounds.  Abdominal:     Palpations: Abdomen is soft.     Tenderness: There is no abdominal tenderness.  Musculoskeletal:         General: No swelling.     Cervical back: Neck supple.  Skin:    General: Skin is warm and dry.     Capillary Refill: Capillary refill takes less than 2 seconds.     Comments: Obvious cellulitis with multiple scabs of the left foot.  No obvious palpable fluctuance or abscess, no purulence.  Erythematous ulceration to the medial aspect of the right calf with overlying granulation tissue.  No obvious abscess.  Hidradenitis of the right axilla  Neurological:     Mental Status: She is alert.  Psychiatric:        Mood and Affect: Mood normal.      Procedures   If procedures were preformed on this patient, they are listed below:  Procedures   @BBSIG @  Please note that this documentation was produced with the assistance of voice-to-text technology and may contain errors.    Sharyne Darina RAMAN, MD 08/10/23 1541    Freddi Hamilton, MD 08/11/23 [phone]

## 2023-08-10 NOTE — ED Notes (Signed)
 Patient transported to X-ray

## 2023-08-10 NOTE — ED Notes (Signed)
Patient refusing IV fluids.

## 2023-08-10 NOTE — Discharge Instructions (Addendum)
 You were seen today for foot and leg infection. While you were here we monitored your vitals, preformed a physical exam, and x-ray.  We requested labs however you did not want them.   Things to do:  - Follow up with your primary care provider within the next 1-2 weeks - Begin taking your antibiotics as prescribed and follow-up with wound care.  Return to the emergency department if you have any new or worsening symptoms including nausea, vomiting, fevers, or if you have any other concerns.

## 2023-08-10 NOTE — ED Notes (Signed)
 When going to obtain blood/IV, patient not by bed.

## 2023-08-10 NOTE — ED Triage Notes (Signed)
 C/O left foot swelling. Denies falls/ recent trauma. Pt states she has poison ivy. Pt states she has a staph infection under right armpit.

## 2023-08-11 ENCOUNTER — Other Ambulatory Visit (HOSPITAL_COMMUNITY): Payer: Self-pay

## 2023-08-15 ENCOUNTER — Other Ambulatory Visit (HOSPITAL_COMMUNITY): Payer: Self-pay

## 2023-08-25 NOTE — Nursing Note (Signed)
 I went over the AVS with the pt in room 719 on 08/25/2023. Pt verbalized that she understood everything in the AVS. Pt medications that were stored in the pharmacy were returned to her. Pt was taken to retail pharmacy to pick-up discharge medications. Cab voucher was given to cab driver.

## 2023-08-25 NOTE — Discharge Summary (Signed)
 ------------------------------------------------------------------------------- Attestation signed by Cheryl MARLA Fairly, MD at 08/27/2023 12:42 PM I have reviewed the note and agree with the plan as documented by APP.  I have not seen the patient.    [name], MD FACP Assistant Professor of Medicine Diplomat of American Board of Internal Medicine [facility] Department of Internal Medicine, Section of Hospital Medicine 08/27/2023 12:42 PM    -------------------------------------------------------------------------------    Hospital Medicine Discharge Summary   Demographics: Cheryl Hogan y.o. 12/31/1985 MRN: [id number]    Extended Emergency Contact Information Primary Emergency Contact: [facility] Phone: [phone] Relation: Friend  Full Code  Admit Date: 08/18/2023                            Attending Physician: Calvert Newport, MD Discharge Date: 08/25/2023  Primary Care Provider: No Pcp   None  Consults during this admission: Consult Orders             IP CONSULT TO GENERAL SURGERY       Specialty:  General Surgery  Provider:  (Not yet assigned)      IP CONSULT TO HOSPITALIST       Provider:  (Not yet assigned)      IP CONSULT TO PSYCHIATRY       Specialty:  Psychiatry  Provider:  (Not yet assigned)             Active & Resolved Diagnosis: Principal Problem:   Cellulitis of lower leg Active Problems:   Polysubstance abuse (CMD)   Thrombocytosis   Elevated alkaline phosphatase level   Depression, unspecified   Hepatitis C Resolved Problems:   * No resolved hospital problems. *  Disposition: Patient discharged to Other: [facility] in stable condition.  Discharge follow-up recommendations : See Hospital Course   Hospital Course: 38 year old female with history of polysubstance abuse, IVDA presents to the ER for evaluation of left leg swelling and redness x 3 days prior to arrival to ED Patient  is currently homeless and uses IV heroin at this time, she infects in her lower extremities as well. She was admitted to the hospital for broad spec ABX and further monitoring.   Assessment & Plan Cellulitis of lower leg Active IVDU  Recent Labs    08/23/23 0654 08/24/23 0756 08/25/23 0623  WBC 15.93* 14.08* 10.07   Blood cultures x 2 NGTD Vancomycin  and Cefepime  discontinued on 8/20 Pt has been transitioned to Ciprofloxacin and Bactrim , and will complete remainder of course on DC Lactic acid WNL CT showing small abscess in left leg Initially noted to have leukocytosis, which resolved with empiric antibiotics  -EGS consulted, appreciate recs  -palpation to the ankle demonstrates the ankle to be firmer. No plans for I&D at this time.  Polysubstance abuse (CMD) UDS positive: Cocaine, fentanyl , THC Route: IVDA Reported Last use August 17, 2023 COWS score on admission:17 - Psych consulted, assistance appreciated - Continue Suboxone  (started on 8/20) - Pt ultimately accepted for residential treatment at [facility]  Of note, chart reviewed. Last UDS completed 05/31/23:  - Positive: Fentanyl , THC, Cocaine, Amphetamines.   Thrombocytosis In setting of acute infection and recent polysubstance abuse Continue to monitor - Recommend repeat CBC within one week Elevated alkaline phosphatase level In setting of substance abuse  Depression, unspecified Continue Sertraline  Hepatitis C Labs positive for HCV AB HCV RNA resulted with 20k Will need OP follow up with GI  HBV and Hep A non reactive         Wound / Incision Assessment: Refer to Chart Review and Media Tab for images if available.  Wound 06/01/23 Arm Right (Active)  Date First Assessed/Time First Assessed: 06/01/23 1400   Pre-Existing Wound: Yes  Location: Arm  Wound Location Orientation: Right  Wound Description (Comments): abscess, I&D performed 06/01/23     Wound 08/18/23 Pretibial Right (Active)  Date  First Assessed/Time First Assessed: 08/18/23 1700   Pre-Existing Wound: Yes  Location: Pretibial  Wound Location Orientation: Right     Wound 08/18/23 Ankle Anterior;Left (Active)  Date First Assessed/Time First Assessed: 08/18/23 1700   Pre-Existing Wound: Yes  Location: Ankle  Wound Location Orientation: Anterior;Left    Vital Sign Range:  Temp:  [97.7 F (36.5 C)-98.4 F (36.9 C)] 98 F (36.7 C) Heart Rate:  [89-99] 99 Resp:  [18] 18 BP: (110-115)/(84-87) 115/87      Discharge Medications     New Medications      Sig Disp Refill Start End  buprenorphine -naloxone  2-0.5 mg Subl per tablet Commonly known as: SUBOXONE   Place 2 tablets under the tongue 2 (two) times a day for 3 days.  12 tablet  0     cholecalciferol 1,000 unit (25 mcg) tablet Commonly known as: VITAMIN D3  Take 1 each (1,000 Units total) by mouth daily.  30 each  0     ciprofloxacin 500 mg tablet Commonly known as: CIPRO  Take 1 tablet (500 mg total) by mouth 2 (two) times a day for 4 days.  8 tablet  0     sertraline 50 mg tablet Commonly known as: ZOLOFT  Take 1 tablet (50 mg total) by mouth daily.  30 tablet  0     sulfamethoxazole -trimethoprim  800-160 mg per tablet Commonly known as: BACTRIM  DS  Take 1 tablet by mouth every 12 (twelve) hours for 4 days.  8 tablet  0     traZODone  100 mg tablet Commonly known as: DESYREL   Take 1 tablet (100 mg total) by mouth at bedtime.  30 tablet  0         Medications To Continue      Sig Disp Refill Start End  naloxone  4 mg/actuation Spry nasal spray Commonly known as: Narcan   Administer 1 spray into affected nostril(s) as needed (overdose).  1 each  0         Stopped Medications    nicotine  21 mg/24 hr patch Commonly known as: NICODERM CQ        Discharge Orders     Ambulatory referral to PCP     Full Code     Lifting Limits:     Details:    Lifting Limits: No lifting limits  Additional instructions:   There is currently  a home medication(s) being stored in the pharmacy. Please return to patient prior to discharge.         Lab Results  Component Value Date/Time   HGB 13.4 08/25/2023 06:23 AM   HCT 38.9 08/25/2023 06:23 AM   WBC 10.07 08/25/2023 06:23 AM   PLT 547 (H) 08/25/2023 06:23 AM   Lab Results  Component Value Date/Time   NA 132 (L) 08/25/2023 06:23 AM   K 4.2 08/25/2023 06:23 AM   CREATININE 0.50 (L) 08/25/2023 06:23 AM   BUN 21 08/25/2023 06:23 AM   GLUCOSE 102 (H) 08/25/2023 06:23 AM    Pertinent Imaging: US  Peripheral Venous Legs  Final Result by Alverna  Lamar Sieving, DO (08/15 1438)                                                    [facility]                                                  [facility]                                                  [facility]                                                  [place]                                                   Heart and                                                   Vascular                                                   [address]                                                  [place]  [place] [address]                                Lower Extremity Venous Ultrasound Report  Name  Cheryl Hogan, Cheryl Hogan             Study Date  08-18-2023 01 16 PM  MRN  [id number]                         Patient Location  [facility]  DOB  Jan 01, 1986                       Gender  Female                                Height  69 in  Age  89 yrs                           Ethnicity  1                                  Weight  150 lb  Ordering Physician  PLANTER, LAQUETTA MONET  Referring Physician  PLANTER, LAQUETTA MONET  Performed By  GLADIS DILLON  Interpretation Summary  There is no evidence of DVT or superficial vein  thrombophlebitis in the   lower extremities  bilaterally. Note  While no calf DVT was detected, a single Doppler study   cannot exclude calf DVT  completely. If there is high clinical suspicion, consider a repeat study   in 5-7 days to detect  proximal propagation of calf DVT.  Procedure  A bilateral lower extremity venous duplex exam was performed.  Right Proximal  Phasic venous flow in the common femoral vein is present. Distal   augmentation of flow the common  femoral vein is present. Compression of the common femoral vein is   complete. Phasic venous flow in  the superficial femoral vein is present. Distal augmentation of flow in   the superficial femoral vein  is present. Compression of the superficial femoral vein is complete .   Compression of the profunda  femoral vein is complete. Phasic venous flow in the popliteal vein is   present. Distal augmentation  of flow in the popliteal vein is present. Compression of the popliteal   vein is complete.  Right Distal  Compression of the posterior tibial vein is complete. Compression of the   right peroneal vein is  complete.  Right Saphenous  Compression of the great saphenous vein is complete. Compression of the   small saphenous vein is  complete.  Left Proximal  Phasic venous flow in the common femoral vein is present. Distal   augmentation of flow the common  femoral vein is present. Compression of the common femoral vein is   complete. Phasic venous flow in  the superficial femoral vein is present. Distal augmentation of flow in   the superficial femoral vein  is present. Compression of the superficial femoral vein is complete.   Compression of the profunda  femoral vein is complete. Phasic venous flow in the popliteal vein is   present. Distal augmentation  of flow in the popliteal vein is present. Compression of the popliteal   vein is complete.  Left Distal  Compression of the posterior tibial vein is complete.  Compression of the   left peroneal vein is  complete.  Left Saphenous  Compression of the great saphenous vein is complete. The small saphenous   vein is not visualized.  ___________________________________________________________________________  ___  Electronically signed by MD Alverna JONELLE Sieving, MD, [phone]   on   08-18-2023 02   38 PM      Electronically signed by: Katelyn Helen Moser, PA-C 08/25/2023 7:46 AM   Time spent on discharge: 45 minutes  Location Information: Patient State (at time of visit):   Patient Location (at time of visit):Medical Facility: [facility] Provider Location: Home Is provider licensed to provide clinical care in the current location/state of the patient? Yes   Consent:  Patient's identity was confirmed. Presenting condition or illness was discussed with the patient/personal representative. Current proposed treatment for presenting condition or illness was explained to patient/personal representative along with the likely benefits and any significant risks or complications associated with the provision of treatment by audio/video means. The patient/personal representative verbally authorized treatment to be provided by audio/video, which may include a limited review of patient's current health status, medication, or other treatment recommendations, patient education, and an opportunity to ask questions about condition and treatment. Verbal Consent Granted by Patient/Personal Representative:Yes   Visit Information: Modality: 2-Way Real-Time Audio/Video

## 2023-08-25 NOTE — Progress Notes (Signed)
 Case Management Discharge Note        CSN: [id number] DOB: [birth year] Service: General Medicine Location: 719/01  Patient Class: Inpatient  DC Disposition: : [facility]Detox  Discharge DC Disposition: : [facility]/Detox  Discharge Referrals Patient Preference: Chosen geographical local area/county shared with patient/family: Patient/family declined Patient Preference for Post-Acute Provider Form completed: Return/Previous Involvement Case closed, patient/family agree with disposition plan: Yes    Patient was provided with a 30 day supply of medications from retail pharmacy with copay waived,  Taxi picked up the patient and is to take her to [facility] treatment facility         Royanne JINNY Bernheim, RN

## 2023-08-30 ENCOUNTER — Emergency Department (HOSPITAL_BASED_OUTPATIENT_CLINIC_OR_DEPARTMENT_OTHER)
Admission: EM | Admit: 2023-08-30 | Discharge: 2023-08-30 | Disposition: A | Discharge status: Home / Self Care | Payer: MEDICAID | Attending: Emergency Medicine | Admitting: Emergency Medicine

## 2023-08-30 ENCOUNTER — Encounter (HOSPITAL_BASED_OUTPATIENT_CLINIC_OR_DEPARTMENT_OTHER): Payer: Self-pay

## 2023-08-30 ENCOUNTER — Other Ambulatory Visit: Payer: Self-pay

## 2023-08-30 DIAGNOSIS — L03116 Cellulitis of left lower limb: Secondary | ICD-10-CM | POA: Diagnosis present

## 2023-08-30 MED ORDER — SULFAMETHOXAZOLE-TRIMETHOPRIM 800-160 MG PO TABS: 1.0000 | ORAL_TABLET | Freq: Two times a day (BID) | ORAL | 0 refills | Status: AC

## 2023-08-30 NOTE — ED Provider Notes (Signed)
 [place] [facility] AT [facility] Provider Note   CSN: [id number] Arrival date & time: 08/30/23  1440     Patient presents with: Leg Pain   Cheryl Hogan is a 38 y.o. female.  38 year old female presents [facility] from [facility] for complaints of left lower extremity cellulitis.  Patient reports she just finished a course of antibiotics yesterday which has improved the cellulitis but there is still redness and pain to the left lower extremity.  Patient has a wound and erythema warmth.  Very mild pitting edema at the sock line.  Patient was referred to the wound center and did not follow-up with them.     Prior to Admission medications   Medication Sig Start Date End Date Taking? Authorizing Provider  sulfamethoxazole -trimethoprim  (BACTRIM  DS) 800-160 MG tablet Take 1 tablet by mouth 2 (two) times daily for 7 days. 08/30/23 09/06/23 Yes Myriam Fonda RAMAN, PA-C  doxycycline  (VIBRAMYCIN ) 100 MG capsule Take 1 capsule (100 mg total) by mouth 2 (two) times daily. 06/15/23   Curatolo, Adam, DO  naloxone  (NARCAN ) nasal spray 4 mg/0.1 mL Place 1 spray into the nose as needed for overdose. 11/07/22     traZODone  (DESYREL ) 50 MG tablet Take 0.5-1 tablets (25-50 mg total) by mouth at bedtime as needed for sleep. 08/10/22 09/28/22  Cyndi Shaver, PA-C    Allergies: Patient has no known allergies.    Review of Systems  Constitutional:  Negative for chills and fever.  Skin:  Positive for color change and wound.  All other systems reviewed and are negative.   Updated Vital Signs BP 112/82 (BP Location: Left Arm)   Pulse 81   Temp 97.9 F (36.6 C) (Oral)   Resp 16   Ht 5' 9 (1.753 m)   Wt 76.2 kg   LMP 07/26/2023   SpO2 99%   BMI 24.81 kg/m   Physical Exam Vitals and nursing note reviewed.  Constitutional:      General: She is not in acute distress.    Appearance: Normal appearance. She is not ill-appearing.  HENT:     Head: Normocephalic and atraumatic.  Eyes:      Extraocular Movements: Extraocular movements intact.     Pupils: Pupils are equal, round, and reactive to light.  Cardiovascular:     Rate and Rhythm: Normal rate.  Pulmonary:     Effort: Pulmonary effort is normal. No respiratory distress.  Abdominal:     Tenderness: There is no guarding.  Musculoskeletal:        General: Normal range of motion.     Cervical back: Normal range of motion.     Right lower leg: Normal.     Left lower leg: Swelling and tenderness present. No lacerations or bony tenderness.     Comments: Patient has mild left lower extremity edema.  Warmth around the wound and mild pain.   Skin:    General: Skin is warm and dry.     Findings: Erythema present.  Neurological:     General: No focal deficit present.     Mental Status: She is alert.  Psychiatric:        Mood and Affect: Mood normal.        Behavior: Behavior normal.       (all labs ordered are listed, but only abnormal results are displayed) Labs Reviewed - No data to display  EKG: None  Radiology: No results found.   Procedures   Medications Ordered in the ED -  No data to display  38 y.o. female presents to the ED for concern of Leg Pain     This involves an extensive number of treatment options, and is a complaint that carries with it a high risk of complications and morbidity.  The emergent differential diagnosis prior to evaluation includes, but is not limited to: Cellulitis, DVT, necrotizing fasciitis, osteomyelitis, sepsis  This is not an exhaustive differential.   Past Medical History / Co-morbidities / Social History: Hx of opioid use disorder, cellulitis, depression, pulmonary abscess Social Determinants of Health include: Previous opioid use, tobacco use,  Additional History:  Obtained by chart review.  Notably history of hepatitis he STI and multiple substance use which was managed by primary care   ED Course / Critical Interventions: Pt well-appearing on exam sitting  comfortable in ED bed reading above.  Patient advised she just finished up her antibiotic course yesterday and is concerned there may still be infection.  Patient has a dry wound with surrounding erythema and warmth.  Mild pitting edema at sock line.  Knees are not swollen or warm and no pain on palpation.  Patient has good distal pulses and good sensation.  Patient has full range of motion with the left foot but reports mild pain.  PE is not suspected due to the patient's vitals and no reported shortness of breath.  DVT is not suspected no obvious signs of unilateral knee swelling.  Patient has not had any recent drug use and no decreased activity.  Patient has not reported any fevers.  Upon reevaluation, it was discussed with attending and continuation of Bactrim  for 7 days and follow-up with wound care as appropriate course of action.  Patient was advised of signs to look out for and advised to return to ED for evaluation if they occur.  Patient advised to follow-up with wound care. I have reviewed the patients home medicines and have made adjustments as needed.  Disposition: Considered admission and after reviewing the patient's encounter today, I feel that the patient would benefit from outpatient antibiotics and wound care.  Discussed course of treatment with the patient, whom demonstrated understanding.  Patient in agreement and has no further questions.    I discussed this case with my attending, Dr. Armenta, who agreed with the proposed treatment course and cosigned this note including patient's presenting symptoms, physical exam, and planned diagnostics and interventions.  Attending physician stated agreement with plan or made changes to plan which were implemented.     This chart was dictated using voice recognition software.  Despite best efforts to proofread, errors can occur which can change the documentation meaning.   Final diagnoses:  Cellulitis of left lower extremity    ED  Discharge Orders          Ordered    sulfamethoxazole -trimethoprim  (BACTRIM  DS) 800-160 MG tablet  2 times daily        08/30/23 [address] [place] 09/01/23 [id number]    Armenta Canning, MD 09/11/23 [phone]

## 2023-08-30 NOTE — Discharge Instructions (Addendum)
 Take antibiotics 2 times a day for 7 days.  I would like you to follow-up with wound care center for management of wound on the left lower extremity.  If symptoms worsen or are not resolved after course of antibiotics return for further evaluation.

## 2023-08-30 NOTE — ED Triage Notes (Addendum)
 Pt states that she just completed her antibiotic for cellulitis yesterday, but she is still having pain and redness to her left lower leg.. States that she has been clean 13 days.  States that she is currently at [facility].

## 2023-09-07 ENCOUNTER — Ambulatory Visit (HOSPITAL_BASED_OUTPATIENT_CLINIC_OR_DEPARTMENT_OTHER): Payer: MEDICAID | Admitting: General Surgery

## 2023-09-07 ENCOUNTER — Encounter (HOSPITAL_BASED_OUTPATIENT_CLINIC_OR_DEPARTMENT_OTHER): Payer: MEDICAID | Attending: General Surgery | Admitting: General Surgery

## 2023-09-07 DIAGNOSIS — R600 Localized edema: Secondary | ICD-10-CM | POA: Insufficient documentation

## 2023-09-07 DIAGNOSIS — L97829 Non-pressure chronic ulcer of other part of left lower leg with unspecified severity: Secondary | ICD-10-CM | POA: Diagnosis not present

## 2023-09-07 DIAGNOSIS — F1991 Other psychoactive substance use, unspecified, in remission: Secondary | ICD-10-CM | POA: Diagnosis not present

## 2023-09-07 DIAGNOSIS — L97819 Non-pressure chronic ulcer of other part of right lower leg with unspecified severity: Secondary | ICD-10-CM | POA: Insufficient documentation

## 2023-09-14 ENCOUNTER — Encounter (HOSPITAL_BASED_OUTPATIENT_CLINIC_OR_DEPARTMENT_OTHER): Payer: MEDICAID | Admitting: General Surgery

## 2023-09-14 DIAGNOSIS — L97819 Non-pressure chronic ulcer of other part of right lower leg with unspecified severity: Secondary | ICD-10-CM | POA: Diagnosis not present

## 2023-09-15 ENCOUNTER — Ambulatory Visit (HOSPITAL_BASED_OUTPATIENT_CLINIC_OR_DEPARTMENT_OTHER): Payer: MEDICAID | Admitting: General Surgery

## 2023-10-09 ENCOUNTER — Encounter: Payer: Self-pay | Admitting: Physician Assistant

## 2023-10-09 ENCOUNTER — Ambulatory Visit: Payer: MEDICAID | Admitting: Physician Assistant

## 2023-10-09 VITALS — BP 99/62 | HR 67 | Ht 69.0 in | Wt 188.0 lb

## 2023-10-09 DIAGNOSIS — R748 Abnormal levels of other serum enzymes: Secondary | ICD-10-CM

## 2023-10-09 DIAGNOSIS — Z124 Encounter for screening for malignant neoplasm of cervix: Secondary | ICD-10-CM

## 2023-10-09 DIAGNOSIS — G8929 Other chronic pain: Secondary | ICD-10-CM

## 2023-10-09 DIAGNOSIS — L309 Dermatitis, unspecified: Secondary | ICD-10-CM | POA: Diagnosis not present

## 2023-10-09 DIAGNOSIS — E559 Vitamin D deficiency, unspecified: Secondary | ICD-10-CM

## 2023-10-09 DIAGNOSIS — B1920 Unspecified viral hepatitis C without hepatic coma: Secondary | ICD-10-CM | POA: Diagnosis not present

## 2023-10-09 DIAGNOSIS — H9191 Unspecified hearing loss, right ear: Secondary | ICD-10-CM | POA: Diagnosis not present

## 2023-10-09 DIAGNOSIS — K089 Disorder of teeth and supporting structures, unspecified: Secondary | ICD-10-CM

## 2023-10-09 DIAGNOSIS — Z872 Personal history of diseases of the skin and subcutaneous tissue: Secondary | ICD-10-CM | POA: Diagnosis not present

## 2023-10-09 DIAGNOSIS — F1910 Other psychoactive substance abuse, uncomplicated: Secondary | ICD-10-CM

## 2023-10-09 MED ORDER — ACETAMINOPHEN 500 MG PO TABS: ORAL_TABLET | ORAL | 1 refills | Status: DC

## 2023-10-09 MED ORDER — ACETAMINOPHEN 500 MG PO TABS: ORAL_TABLET | ORAL | 1 refills | Status: AC

## 2023-10-09 MED ORDER — TRIAMCINOLONE ACETONIDE 0.1 % EX CREA: 1.0000 | TOPICAL_CREAM | Freq: Two times a day (BID) | CUTANEOUS | 0 refills | Status: DC

## 2023-10-09 NOTE — Patient Instructions (Signed)
 VISIT SUMMARY:  During today's visit, we discussed your ongoing health concerns, including your chronic hepatitis C infection, dental issues, psoriasis, leg swelling, hearing loss, and vitamin D deficiency. We also reviewed your general health maintenance needs.  YOUR PLAN:  -CHRONIC HEPATITIS C INFECTION WITH ELEVATED LIVER ENZYMES: You have an active hepatitis C infection, which is causing elevated liver enzymes. This condition affects your liver's ability to function properly. We will refer you to an infectious disease specialist for further management and treatment. In the meantime, avoid taking ibuprofen  and use Tylenol  instead, taking 1-2 tablets every 8 hours as needed.  -DENTAL DISEASE WITH PAIN AND BROKEN TOOTH: You have severe periodontal disease and a broken tooth causing pain. It is important to seek dental care. Please contact dental offices that accept your insurance to schedule an appointment. For pain management, you can take Tylenol  as needed.  -PSORIASIS: Psoriasis is a skin condition that causes red, itchy patches, primarily affecting your scalp and behind your ears. We have prescribed triamcinolone  cream to be applied to the affected areas twice daily for two weeks. We will re-evaluate your condition in two weeks.  -CHRONIC LEFT LOWER EXTREMITY SWELLING AND POST-CELLULITIS SKIN CHANGES: You have chronic swelling and skin changes in your left leg following cellulitis. These changes may improve over time. We have documented the current condition of your skin with photographs.  -UNILATERAL RIGHT SENSORINEURAL HEARING LOSS: You have long-standing hearing loss in your right ear due to childhood trauma. We will refer you to an ear, nose, and throat (ENT) specialist for an evaluation and hearing test.   -GENERAL HEALTH MAINTENANCE: You are overdue for a Pap smear, which is important for regular health screenings. We will refer you to a gynecologist for this test. Since you are under 40,  a mammogram is not needed at this time.  INSTRUCTIONS:  1. Follow up with the infectious disease specialist for hepatitis C management. 2. Contact dental offices that accept your insurance to address your dental issues. 3. Apply triamcinolone  cream to the affected areas twice daily for two weeks and return for re-evaluation. 4. Continue taking your daily multivitamin for vitamin D deficiency. 5. Follow up with the ENT specialist for your hearing evaluation and test. 6. Schedule an appointment with a gynecologist for a Pap smear.
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
## 2023-10-10 ENCOUNTER — Encounter: Payer: Self-pay | Admitting: Physician Assistant

## 2023-10-13 ENCOUNTER — Telehealth: Payer: Self-pay

## 2023-10-13 ENCOUNTER — Other Ambulatory Visit (HOSPITAL_COMMUNITY): Payer: Self-pay

## 2023-10-13 NOTE — Telephone Encounter (Signed)
 Pharmacy Patient Advocate Encounter  Insurance verification completed.   The patient is insured through [employer] [place] [employer]   Ran test claim for Mavyret . Currently a quantity of 84 is a 28 day supply and the co-pay is $4.00 .   This test claim was processed through [facility]- copay amounts may vary at other pharmacies due to pharmacy/plan contracts, or as the patient moves through the different stages of their insurance plan.

## 2023-10-17 ENCOUNTER — Ambulatory Visit (INDEPENDENT_AMBULATORY_CARE_PROVIDER_SITE_OTHER): Payer: MEDICAID | Admitting: Family

## 2023-10-17 ENCOUNTER — Encounter: Payer: Self-pay | Admitting: Family

## 2023-10-17 ENCOUNTER — Other Ambulatory Visit: Payer: Self-pay

## 2023-10-17 VITALS — BP 95/66 | HR 74 | Temp 99.2°F | Wt 189.0 lb

## 2023-10-17 DIAGNOSIS — B182 Chronic viral hepatitis C: Secondary | ICD-10-CM | POA: Insufficient documentation

## 2023-10-17 DIAGNOSIS — F1190 Opioid use, unspecified, uncomplicated: Secondary | ICD-10-CM

## 2023-10-17 NOTE — Patient Instructions (Addendum)
 Nice to see you.  We will check your lab work today.  Plan for follow up in 1 months after start of treatment or sooner if needed'  Have a great day and stay safe!

## 2023-10-17 NOTE — Progress Notes (Signed)
 Subjective:   Patient ID: Cheryl Hogan, female    DOB: [birth year], 38 y.o.   MRN: [id number]  Chief Complaint  Patient presents with   Hepatitis C    HPI:  Cheryl Hogan is a 38 y.o. female with previous medical history of Hepatitis C s/p treatment with Mavyret  in 2020, anxiety, depression and injection drug use.   Cheryl Hogan was previously seen by Corean Fireman, NP for Genotype 3 chronic Hepatitis C with initial Hepatitis C RNA level of 8760 with risk factor of injection drug use. Completed treatment with [facility] on 05/13/19. Admitted to [facility] in August 2025 and found to have a Hepatitis C RNA level of 20,000 with toxicology panel positive for fentanyl  and cocaine.  Cheryl Hogan has been been sober since being hospitalized in August and is currently in a rehabilitation program at [facility]. She is 2 months sober and maintained on suboxone  for OUD. Risk factor for Hepatitis C is relapse in injection drug use. Unknown of any personal or family history of liver disease. Has fatigue with occasional abdominal pain and denies nausea, vomiting, fever, scleral icterus or jaundice. No current drug use and has stopped using tobacco as well. Covered by [employer].   No Known Allergies    Outpatient Medications Prior to Visit  Medication Sig Dispense Refill   acetaminophen  (TYLENOL ) 500 MG tablet Take 1-2 every 8 hours PRN for pain 60 tablet 1   Buprenorphine  HCl-Naloxone  HCl 8-2 MG FILM Place 1 Film under the tongue 2 (two) times daily.     lamoTRIgine (LAMICTAL) 25 MG tablet Take 25 mg by mouth daily.     risperiDONE (RISPERDAL) 2 MG tablet Take 2 mg by mouth at bedtime.     sertraline (ZOLOFT) 100 MG tablet Take 100 mg by mouth daily.     traZODone  (DESYREL ) 50 MG tablet Take 50 mg by mouth at bedtime.     triamcinolone  cream (KENALOG ) 0.1 % Apply 1 Application topically 2 (two) times daily. 80 g 0   No facility-administered medications prior to visit.     Past Medical History:   Diagnosis Date   Abscess of right hand 01/24/2018   ADD (attention deficit disorder)    Anxiety    Cellulitis of left hand    Depression    Injection of illicit drug within last 12 months    Opioid dependence on agonist therapy (HCC)    Psoriasis    Pulmonary abscess (HCC) 01/24/2018   Substance abuse (HCC)      Past Surgical History:  Procedure Laterality Date   I & D EXTREMITY Bilateral 01/24/2018   Procedure: IRRIGATION AND DEBRIDEMENT OF HAND;  Surgeon: Shari Easter, MD;  Location: [facility] OR;  Service: Orthopedics;  Laterality: Bilateral;   TEE WITHOUT CARDIOVERSION N/A 01/29/2018   Procedure: TRANSESOPHAGEAL ECHOCARDIOGRAM (TEE);  Surgeon: Francyne Headland, MD;  Location:  [facility] ENDOSCOPY;  Service: Cardiovascular;  Laterality: N/A;       Review of Systems  Constitutional:  Negative for chills, fatigue, fever and unexpected weight change.  Respiratory:  Negative for cough, chest tightness, shortness of breath and wheezing.   Cardiovascular:  Negative for chest pain and leg swelling.  Gastrointestinal:  Negative for abdominal distention, constipation, diarrhea, nausea and vomiting.  Neurological:  Negative for dizziness, weakness, light-headedness and headaches.  Hematological:  Does not bruise/bleed easily.    Objective:   BP 95/66   Pulse 74   Temp 99.2 F (37.3 C) (Oral)   Wt 189 lb (85.7 kg)  LMP 09/17/2023 (Approximate)   SpO2 95%   BMI 27.91 kg/m  Nursing note and vital signs reviewed.  Physical Exam Constitutional:      General: She is not in acute distress.    Appearance: She is well-developed.  Cardiovascular:     Rate and Rhythm: Normal rate and regular rhythm.     Heart sounds: Normal heart sounds. No murmur heard.    No friction rub. No gallop.  Pulmonary:     Effort: Pulmonary effort is normal. No respiratory distress.     Breath sounds: Normal breath sounds. No wheezing or rales.  Chest:     Chest wall: No tenderness.  Abdominal:     General:  Bowel sounds are normal. There is no distension.     Palpations: Abdomen is soft. There is no mass.     Tenderness: There is no abdominal tenderness. There is no guarding or rebound.  Skin:    General: Skin is warm and dry.  Neurological:     Mental Status: She is alert and oriented to person, place, and time.  Psychiatric:        Behavior: Behavior normal.        Thought Content: Thought content normal.        Judgment: Judgment normal.         06/23/2023    1:16 PM 06/18/2023    9:32 AM 10/31/2022   11:48 AM 10/30/2022   10:43 AM 04/21/2020   10:49 AM  Depression screen PHQ 2/9  Decreased Interest     0  Down, Depressed, Hopeless     0  PHQ - 2 Score     0  Altered sleeping     0  Tired, decreased energy     0  Change in appetite     0  Feeling bad or failure about yourself      0  Trouble concentrating     0  Moving slowly or fidgety/restless     0  Suicidal thoughts     0  PHQ-9 Score     0  Difficult doing work/chores          Information is confidential and restricted. Go to Review Flowsheets to unlock data.     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   Insomnia 02/18/2020   Obesity (BMI 30-39.9) 05/16/2019   Encounter for counseling regarding contraception 09/24/2018   Normocytic anemia 03/06/2018   History of hepatitis C 02/13/2018   Opioid use disorder, severe, dependence (HCC) 02/06/2018     Problem List Items Addressed This Visit       Digestive   Chronic hepatitis C without hepatic coma (HCC) - Primary   Cheryl Hogan is a 38 y/o caucasian female with previous history  of Hepatitis C treatment in 2020 returning following relapse of injection drug use and found to have re-infection with Hepatitis C in August 2025 with RNA level of 20,000. Previously treated with Mavyret  and has fatigue with no other symptoms. Discussed plan of care to check blood work today and plan for treatment. Less likely to have  resistance but will check to ensure. Will plan for treatment with Mavyret  or Epclusa pending lab work.       Relevant Orders   HIV Antibody (routine testing w rflx)   CBC   Hepatic function panel   Hepatitis C genotype   Hepatitis C RNA quantitative   Liver Fibrosis, FibroTest-ActiTest   Protime-INR   Basic metabolic panel with GFR   HCV Viral RNA Gen3 NS5a Drug Resist- ([facility])     Other   Opioid use disorder   Cheryl Hogan is currently sober for 2 months and maintained on suboxone  and attending [facility] treatment facility. Well controlled with suboxone . No other drug use currently and congratulated on this accomplishment.         I am having Cheryl Hogan maintain her Buprenorphine  HCl-Naloxone  HCl, lamoTRIgine, risperiDONE, sertraline, traZODone , acetaminophen , and triamcinolone  cream.   Follow-up: 1 month after start of medication or sooner if needed.   Cathlyn July, MSN, FNP-C Nurse Practitioner [facility] Main number: [phone]

## 2023-10-17 NOTE — Assessment & Plan Note (Signed)
 Cheryl Hogan is a 38 y/o caucasian female with previous history of Hepatitis C treatment in 2020 returning following relapse of injection drug use and found to have re-infection with Hepatitis C in August 2025 with RNA level of 20,000. Previously treated with Mavyret  and has fatigue with no other symptoms. Discussed plan of care to check blood work today and plan for treatment. Less likely to have resistance but will check to ensure. Will plan for treatment with Mavyret  or Epclusa pending lab work.

## 2023-10-17 NOTE — Assessment & Plan Note (Signed)
 Cheryl Hogan is currently sober for 2 months and maintained on suboxone  and attending [facility] treatment facility. Well controlled with suboxone . No other drug use currently and congratulated on this accomplishment.

## 2023-10-23 ENCOUNTER — Ambulatory Visit: Payer: MEDICAID | Admitting: Physician Assistant

## 2023-10-23 ENCOUNTER — Encounter: Payer: Self-pay | Admitting: Physician Assistant

## 2023-10-23 VITALS — BP 106/76 | HR 66 | Wt 191.0 lb

## 2023-10-23 DIAGNOSIS — B182 Chronic viral hepatitis C: Secondary | ICD-10-CM

## 2023-10-23 DIAGNOSIS — L309 Dermatitis, unspecified: Secondary | ICD-10-CM

## 2023-10-23 DIAGNOSIS — R635 Abnormal weight gain: Secondary | ICD-10-CM | POA: Diagnosis not present

## 2023-10-23 DIAGNOSIS — H5461 Unqualified visual loss, right eye, normal vision left eye: Secondary | ICD-10-CM | POA: Diagnosis not present

## 2023-10-23 DIAGNOSIS — F1910 Other psychoactive substance abuse, uncomplicated: Secondary | ICD-10-CM

## 2023-10-23 DIAGNOSIS — Z79899 Other long term (current) drug therapy: Secondary | ICD-10-CM

## 2023-10-23 MED ORDER — TRIAMCINOLONE ACETONIDE 0.1 % EX CREA: 1.0000 | TOPICAL_CREAM | Freq: Two times a day (BID) | CUTANEOUS | 0 refills | Status: DC

## 2023-10-23 NOTE — Patient Instructions (Signed)
 VISIT SUMMARY:  During today's visit, we discussed several of your ongoing health concerns, including medication side effects, skin conditions, and vision issues. We reviewed your new medication regimen, addressed your concerns about weight gain, and provided guidance on managing your eczema and psoriasis. We also discussed the swelling in your left leg and your need for new glasses.  YOUR PLAN:  -MEDICATION-INDUCED WEIGHT GAIN: You have experienced significant weight gain, likely due to Risperdal. We recommend discussing a possible medication adjustment with your psychiatrist. In the meantime, try to monitor your caloric intake and manage your hunger cues to help control your weight.  -CHRONIC ECZEMA/PSORIASIS: Your eczema and psoriasis are improving with the use of Kenalog  cream, especially in common areas like behind your ears, under skin folds, and on your elbows and knees. We will continue with the Kenalog  cream, and you will receive a refill prescription.  -SWELLING OF LEFT LEG: The swelling in your left leg may be due to high sodium intake. We recommend elevating your feet whenever possible to help reduce the swelling.  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 We will refer you to an optometrist for a new glasses prescription.
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
## 2023-10-24 ENCOUNTER — Encounter: Payer: Self-pay | Admitting: Physician Assistant

## 2023-10-27 ENCOUNTER — Ambulatory Visit: Payer: Self-pay | Admitting: Family

## 2023-10-28 LAB — HIV ANTIBODY (ROUTINE TESTING W REFLEX)
HIV 1&2 Ab, 4th Generation: NONREACTIVE
HIV FINAL INTERPRETATION: NEGATIVE

## 2023-10-28 LAB — CBC
HCT: 39.8 % (ref 35.0–45.0)
Hemoglobin: 13.4 g/dL (ref 11.7–15.5)
MCH: 28.8 pg (ref 27.0–33.0)
MCHC: 33.7 g/dL (ref 32.0–36.0)
MCV: 85.6 fL (ref 80.0–100.0)
MPV: 11.7 fL (ref 7.5–12.5)
Platelets: 266 Thousand/uL (ref 140–400)
RBC: 4.65 Million/uL (ref 3.80–5.10)
RDW: 15.6 % — ABNORMAL HIGH (ref 11.0–15.0)
WBC: 4.8 Thousand/uL (ref 3.8–10.8)

## 2023-10-28 LAB — HEPATIC FUNCTION PANEL
AG Ratio: 1.5 (calc) (ref 1.0–2.5)
ALT: 59 U/L — ABNORMAL HIGH (ref 6–29)
AST: 51 U/L — ABNORMAL HIGH (ref 10–30)
Albumin: 4.5 g/dL (ref 3.6–5.1)
Alkaline phosphatase (APISO): 142 U/L — ABNORMAL HIGH (ref 31–125)
Bilirubin, Direct: 0.1 mg/dL (ref 0.0–0.2)
Globulin: 3 g/dL (ref 1.9–3.7)
Indirect Bilirubin: 0.4 mg/dL (ref 0.2–1.2)
Total Bilirubin: 0.5 mg/dL (ref 0.2–1.2)
Total Protein: 7.5 g/dL (ref 6.1–8.1)

## 2023-10-28 LAB — LIVER FIBROSIS, FIBROTEST-ACTITEST
ALT: 62 U/L — ABNORMAL HIGH (ref 6–29)
Alpha-2-Macroglobulin: 270 mg/dL (ref 106–279)
Apolipoprotein A1: 149 mg/dL (ref 101–198)
Bilirubin: 0.4 mg/dL (ref 0.2–1.2)
Fibrosis Score: 0.36
GGT: 81 U/L — ABNORMAL HIGH (ref 3–50)
Haptoglobin: 50 mg/dL (ref 43–212)
Necroinflammat ACT Score: 0.39
Reference ID: 5745054

## 2023-10-28 LAB — HEPATITIS C GENOTYPE: HCV Genotype: NOT DETECTED

## 2023-10-28 LAB — PROTIME-INR
INR: 1.1
Prothrombin Time: 11.3 s (ref 9.0–11.5)

## 2023-10-28 LAB — BASIC METABOLIC PANEL WITH GFR
BUN: 14 mg/dL (ref 7–25)
CO2: 27 mmol/L (ref 20–32)
Calcium: 9.7 mg/dL (ref 8.6–10.2)
Chloride: 104 mmol/L (ref 98–110)
Creat: 0.74 mg/dL (ref 0.50–0.97)
Glucose, Bld: 84 mg/dL (ref 65–99)
Potassium: 4.2 mmol/L (ref 3.5–5.3)
Sodium: 139 mmol/L (ref 135–146)
eGFR: 107 mL/min/1.73m2 (ref >=60)

## 2023-10-28 LAB — HCV VIRAL RNA GEN3 NS5A DRUG RESIST: HCV NS5a Subtype: NOT DETECTED

## 2023-10-28 LAB — HEPATITIS C RNA QUANTITATIVE
HCV Quantitative Log: <1.18 {Log_IU}/mL — AB
HCV RNA, PCR, QN: <15 [IU]/mL — AB

## 2023-11-06 ENCOUNTER — Encounter: Payer: Self-pay | Admitting: Physician Assistant

## 2023-11-06 ENCOUNTER — Ambulatory Visit: Payer: MEDICAID | Admitting: Physician Assistant

## 2023-11-06 VITALS — BP 125/81 | HR 85

## 2023-11-06 DIAGNOSIS — F1910 Other psychoactive substance abuse, uncomplicated: Secondary | ICD-10-CM | POA: Diagnosis not present

## 2023-11-06 DIAGNOSIS — G8929 Other chronic pain: Secondary | ICD-10-CM

## 2023-11-06 DIAGNOSIS — L309 Dermatitis, unspecified: Secondary | ICD-10-CM | POA: Diagnosis not present

## 2023-11-06 DIAGNOSIS — K089 Disorder of teeth and supporting structures, unspecified: Secondary | ICD-10-CM

## 2023-11-06 MED ORDER — TRIAMCINOLONE ACETONIDE 0.1 % EX CREA: 1.0000 | TOPICAL_CREAM | Freq: Two times a day (BID) | CUTANEOUS | 1 refills | Status: AC
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
## 2023-11-07 ENCOUNTER — Encounter: Payer: Self-pay | Admitting: Physician Assistant

## 2023-11-10 ENCOUNTER — Telehealth: Payer: Self-pay

## 2023-11-10 NOTE — Telephone Encounter (Signed)
 This patient called to see if she still needed to receive hep C medication . Per note from Cathlyn 10/27/23 patient has completed treatment and no longer needs medication for this matter. Patient was unaware but not aware due to her being a [facility].
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
## 2024-01-08 ENCOUNTER — Encounter: Payer: Self-pay | Admitting: Physician Assistant

## 2024-01-08 ENCOUNTER — Ambulatory Visit: Payer: MEDICAID | Admitting: Physician Assistant

## 2024-01-08 ENCOUNTER — Other Ambulatory Visit (HOSPITAL_COMMUNITY)
Admission: RE | Admit: 2024-01-08 | Discharge: 2024-01-08 | Disposition: A | Discharge status: Home / Self Care | Payer: MEDICAID | Source: Ambulatory Visit | Attending: Physician Assistant | Admitting: Physician Assistant

## 2024-01-08 VITALS — BP 114/96 | HR 88 | Ht 69.0 in | Wt 210.0 lb

## 2024-01-08 DIAGNOSIS — R635 Abnormal weight gain: Secondary | ICD-10-CM | POA: Diagnosis not present

## 2024-01-08 DIAGNOSIS — F1910 Other psychoactive substance abuse, uncomplicated: Secondary | ICD-10-CM | POA: Diagnosis not present

## 2024-01-08 DIAGNOSIS — R0789 Other chest pain: Secondary | ICD-10-CM | POA: Diagnosis not present

## 2024-01-08 DIAGNOSIS — R040 Epistaxis: Secondary | ICD-10-CM | POA: Diagnosis not present

## 2024-01-08 DIAGNOSIS — N898 Other specified noninflammatory disorders of vagina: Secondary | ICD-10-CM | POA: Insufficient documentation

## 2024-01-08 DIAGNOSIS — M25512 Pain in left shoulder: Secondary | ICD-10-CM | POA: Diagnosis not present

## 2024-01-08 MED ORDER — IBUPROFEN 600 MG PO TABS: 600.0000 mg | ORAL_TABLET | Freq: Three times a day (TID) | ORAL | 0 refills | Status: AC | PRN

## 2024-01-08 MED ORDER — SALINE SPRAY 0.65 % NA SOLN: 1.0000 | NASAL | 0 refills | Status: AC | PRN
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
## 2024-01-08 NOTE — Patient Instructions (Signed)
 VISIT SUMMARY:  Today, you were seen for several concerns including left shoulder pain, chest discomfort, vaginal discharge, weight gain, and nosebleeds. We discussed your symptoms, and I have provided a plan to address each issue.  YOUR PLAN:  -ACUTE PAIN OF LEFT SHOULDER: You have acute pain in your left shoulder that radiates down your arm. This type of pain can be due to various reasons including muscle strain or inflammation. I have prescribed a stronger ibuprofen  for pain management, advised you to do shoulder stretching exercises, and recommended avoiding sleeping on the affected side. Using pillows for support during sleep may also help.  -ATYPICAL CHEST PAIN: You are experiencing intermittent chest pain with a sensation of pressure. I have referred you to a cardiologist for further evaluation to ensure there are no serious underlying heart issues.  -VAGINAL DISCHARGE: You have vaginal discharge with a fishy odor and itching, which could be due to a bacterial or yeast infection. I have taken a vaginal swab for laboratory analysis to determine the exact cause. We will wait for the swab results before starting any treatment.  -ABNORMAL WEIGHT GAIN: You have experienced a significant weight gain of 60 pounds, and your current medications have not helped. I have started a referral to a medical weight management clinic.  -EPISTAXIS: You have had intermittent nosebleeds, likely due to dry air from heating. I have prescribed nasal saline to help moisturize your nasal passages.

## 2024-01-10 ENCOUNTER — Ambulatory Visit: Payer: Self-pay | Admitting: Physician Assistant

## 2024-01-10 DIAGNOSIS — N760 Acute vaginitis: Secondary | ICD-10-CM

## 2024-01-10 LAB — CERVICOVAGINAL ANCILLARY ONLY
Bacterial Vaginitis (gardnerella): POSITIVE — AB
Candida Glabrata: NEGATIVE
Candida Vaginitis: NEGATIVE
Chlamydia: NEGATIVE
Comment: NEGATIVE
Comment: NEGATIVE
Comment: NEGATIVE
Comment: NEGATIVE
Comment: NEGATIVE
Comment: NORMAL
Neisseria Gonorrhea: NEGATIVE
Trichomonas: NEGATIVE

## 2024-01-10 MED ORDER — METRONIDAZOLE 500 MG PO TABS: 500.0000 mg | ORAL_TABLET | Freq: Two times a day (BID) | ORAL | 0 refills | Status: AC

## 2024-01-15 NOTE — Progress Notes (Signed)
 Detailed message left with Stacie Hill aftercare coordinator at [facility] with Test results and provider recommendations.
